# Patient Record
Sex: Male | Born: 1985 | Race: Black or African American | Hispanic: No | Marital: Single | Smoking: Former smoker
Health system: Southern US, Community
[De-identification: ages and names within clinical notes are randomized; demographics above are authoritative.]

## PROBLEM LIST (undated history)

## (undated) DIAGNOSIS — D571 Sickle-cell disease without crisis: Secondary | ICD-10-CM

## (undated) DIAGNOSIS — J189 Pneumonia, unspecified organism: Secondary | ICD-10-CM

## (undated) DIAGNOSIS — Z992 Dependence on renal dialysis: Secondary | ICD-10-CM

## (undated) DIAGNOSIS — N289 Disorder of kidney and ureter, unspecified: Secondary | ICD-10-CM

## (undated) DIAGNOSIS — I509 Heart failure, unspecified: Secondary | ICD-10-CM

## (undated) DIAGNOSIS — I639 Cerebral infarction, unspecified: Secondary | ICD-10-CM

## (undated) DIAGNOSIS — F5089 Other specified eating disorder: Secondary | ICD-10-CM

## (undated) DIAGNOSIS — F1910 Other psychoactive substance abuse, uncomplicated: Secondary | ICD-10-CM

## (undated) DIAGNOSIS — D649 Anemia, unspecified: Secondary | ICD-10-CM

## (undated) DIAGNOSIS — E213 Hyperparathyroidism, unspecified: Secondary | ICD-10-CM

## (undated) DIAGNOSIS — N186 End stage renal disease: Secondary | ICD-10-CM

## (undated) DIAGNOSIS — R0602 Shortness of breath: Secondary | ICD-10-CM

## (undated) HISTORY — PX: CHOLECYSTECTOMY: SHX55

## (undated) HISTORY — PX: WISDOM TOOTH EXTRACTION: SHX21

---
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
## 2007-02-07 ENCOUNTER — Emergency Department (HOSPITAL_COMMUNITY): Admission: EM | Admit: 2007-02-07 | Discharge: 2007-02-07 | Payer: Self-pay | Admitting: Emergency Medicine

## 2007-06-25 ENCOUNTER — Inpatient Hospital Stay (HOSPITAL_COMMUNITY): Admission: EM | Admit: 2007-06-25 | Discharge: 2007-06-26 | Payer: Self-pay | Admitting: Emergency Medicine

## 2007-08-12 ENCOUNTER — Emergency Department (HOSPITAL_COMMUNITY): Admission: EM | Admit: 2007-08-12 | Discharge: 2007-08-12 | Payer: Self-pay | Admitting: Emergency Medicine

## 2007-08-13 ENCOUNTER — Inpatient Hospital Stay (HOSPITAL_COMMUNITY): Admission: EM | Admit: 2007-08-13 | Discharge: 2007-08-16 | Payer: Self-pay | Admitting: *Deleted

## 2007-09-02 ENCOUNTER — Emergency Department (HOSPITAL_COMMUNITY): Admission: EM | Admit: 2007-09-02 | Discharge: 2007-09-03 | Payer: Self-pay | Admitting: Emergency Medicine

## 2007-09-28 ENCOUNTER — Emergency Department (HOSPITAL_COMMUNITY): Admission: EM | Admit: 2007-09-28 | Discharge: 2007-09-28 | Payer: Self-pay | Admitting: Emergency Medicine

## 2007-09-29 ENCOUNTER — Inpatient Hospital Stay (HOSPITAL_COMMUNITY): Admission: EM | Admit: 2007-09-29 | Discharge: 2007-10-05 | Payer: Self-pay | Admitting: Internal Medicine

## 2007-11-13 ENCOUNTER — Emergency Department (HOSPITAL_COMMUNITY): Admission: EM | Admit: 2007-11-13 | Discharge: 2007-11-13 | Payer: Self-pay | Admitting: Emergency Medicine

## 2007-11-24 ENCOUNTER — Inpatient Hospital Stay (HOSPITAL_COMMUNITY): Admission: EM | Admit: 2007-11-24 | Discharge: 2007-11-26 | Payer: Self-pay | Admitting: Emergency Medicine

## 2007-11-25 ENCOUNTER — Encounter (INDEPENDENT_AMBULATORY_CARE_PROVIDER_SITE_OTHER): Payer: Self-pay | Admitting: Surgery

## 2008-02-06 ENCOUNTER — Emergency Department (HOSPITAL_COMMUNITY): Admission: EM | Admit: 2008-02-06 | Discharge: 2008-02-06 | Payer: Self-pay | Admitting: Emergency Medicine

## 2008-03-15 ENCOUNTER — Emergency Department (HOSPITAL_COMMUNITY): Admission: EM | Admit: 2008-03-15 | Discharge: 2008-03-15 | Payer: Self-pay | Admitting: Emergency Medicine

## 2008-03-18 ENCOUNTER — Emergency Department (HOSPITAL_COMMUNITY): Admission: EM | Admit: 2008-03-18 | Discharge: 2008-03-18 | Payer: Self-pay | Admitting: Emergency Medicine

## 2008-04-16 ENCOUNTER — Emergency Department (HOSPITAL_COMMUNITY): Admission: EM | Admit: 2008-04-16 | Discharge: 2008-04-16 | Payer: Self-pay | Admitting: Emergency Medicine

## 2008-05-07 ENCOUNTER — Emergency Department (HOSPITAL_COMMUNITY): Admission: EM | Admit: 2008-05-07 | Discharge: 2008-05-07 | Payer: Self-pay | Admitting: Emergency Medicine

## 2008-05-21 ENCOUNTER — Inpatient Hospital Stay (HOSPITAL_COMMUNITY): Admission: EM | Admit: 2008-05-21 | Discharge: 2008-05-22 | Payer: Self-pay | Admitting: Emergency Medicine

## 2008-07-09 ENCOUNTER — Emergency Department (HOSPITAL_COMMUNITY): Admission: EM | Admit: 2008-07-09 | Discharge: 2008-07-09 | Payer: Self-pay | Admitting: Emergency Medicine

## 2008-07-09 ENCOUNTER — Emergency Department (HOSPITAL_COMMUNITY): Admission: EM | Admit: 2008-07-09 | Discharge: 2008-07-09 | Payer: Self-pay | Admitting: *Deleted

## 2008-07-11 ENCOUNTER — Emergency Department (HOSPITAL_COMMUNITY): Admission: EM | Admit: 2008-07-11 | Discharge: 2008-07-11 | Payer: Self-pay | Admitting: Emergency Medicine

## 2008-10-15 ENCOUNTER — Emergency Department (HOSPITAL_COMMUNITY): Admission: EM | Admit: 2008-10-15 | Discharge: 2008-10-15 | Payer: Self-pay

## 2008-10-29 ENCOUNTER — Emergency Department (HOSPITAL_COMMUNITY): Admission: EM | Admit: 2008-10-29 | Discharge: 2008-10-29 | Payer: Self-pay | Admitting: Emergency Medicine

## 2009-02-27 ENCOUNTER — Observation Stay (HOSPITAL_COMMUNITY): Admission: EM | Admit: 2009-02-27 | Discharge: 2009-02-27 | Payer: Self-pay | Admitting: Emergency Medicine

## 2009-03-24 ENCOUNTER — Observation Stay (HOSPITAL_COMMUNITY): Admission: EM | Admit: 2009-03-24 | Discharge: 2009-03-24 | Payer: Self-pay | Admitting: Emergency Medicine

## 2009-08-08 IMAGING — CR DG CHEST 2V
2 series · 2 of 2 positions shown · non-contrast
Comparison: 08/13/2007

CLINICAL DATA: Fever. Sickle cell crisis.

CHEST - 2 VIEW

[w chest pa]
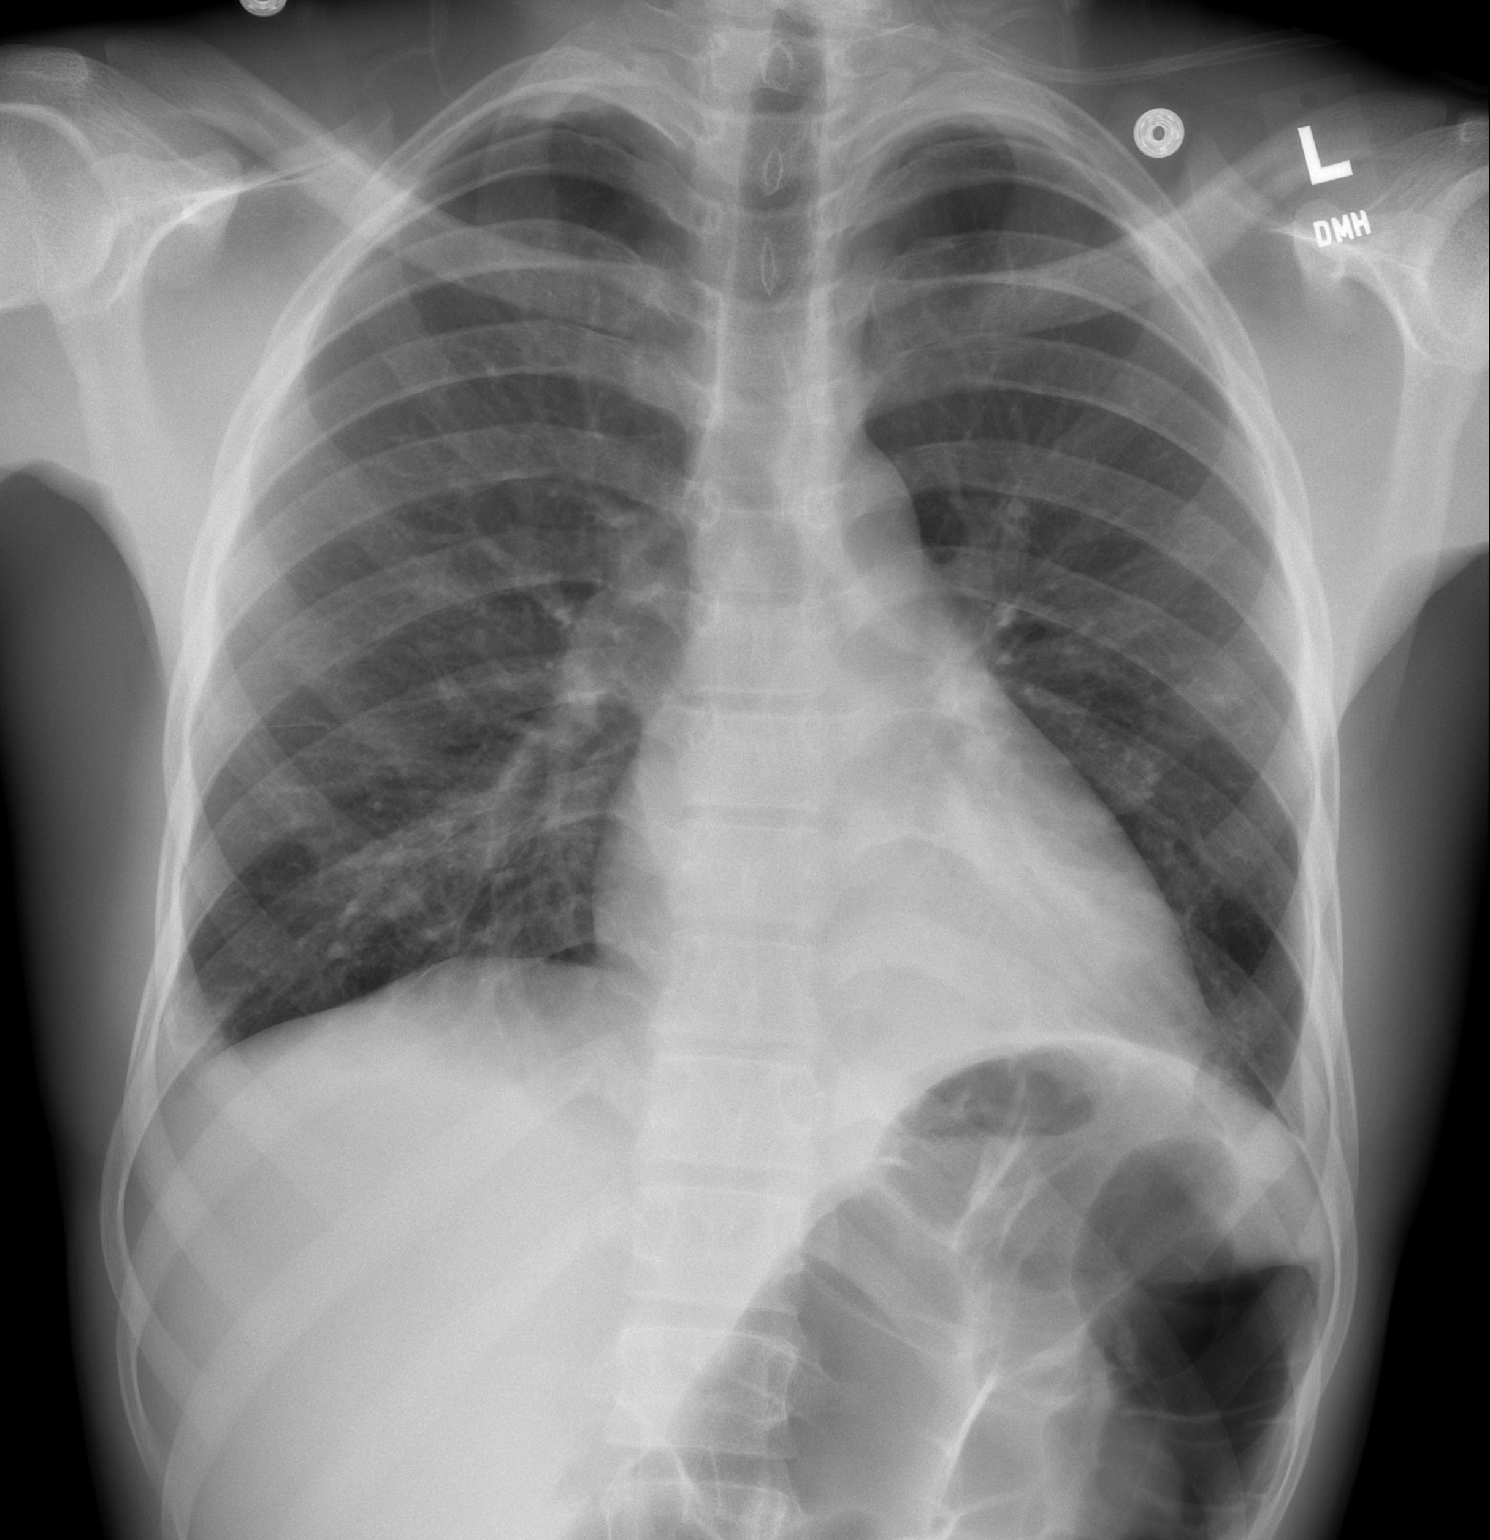

[w chest lat]
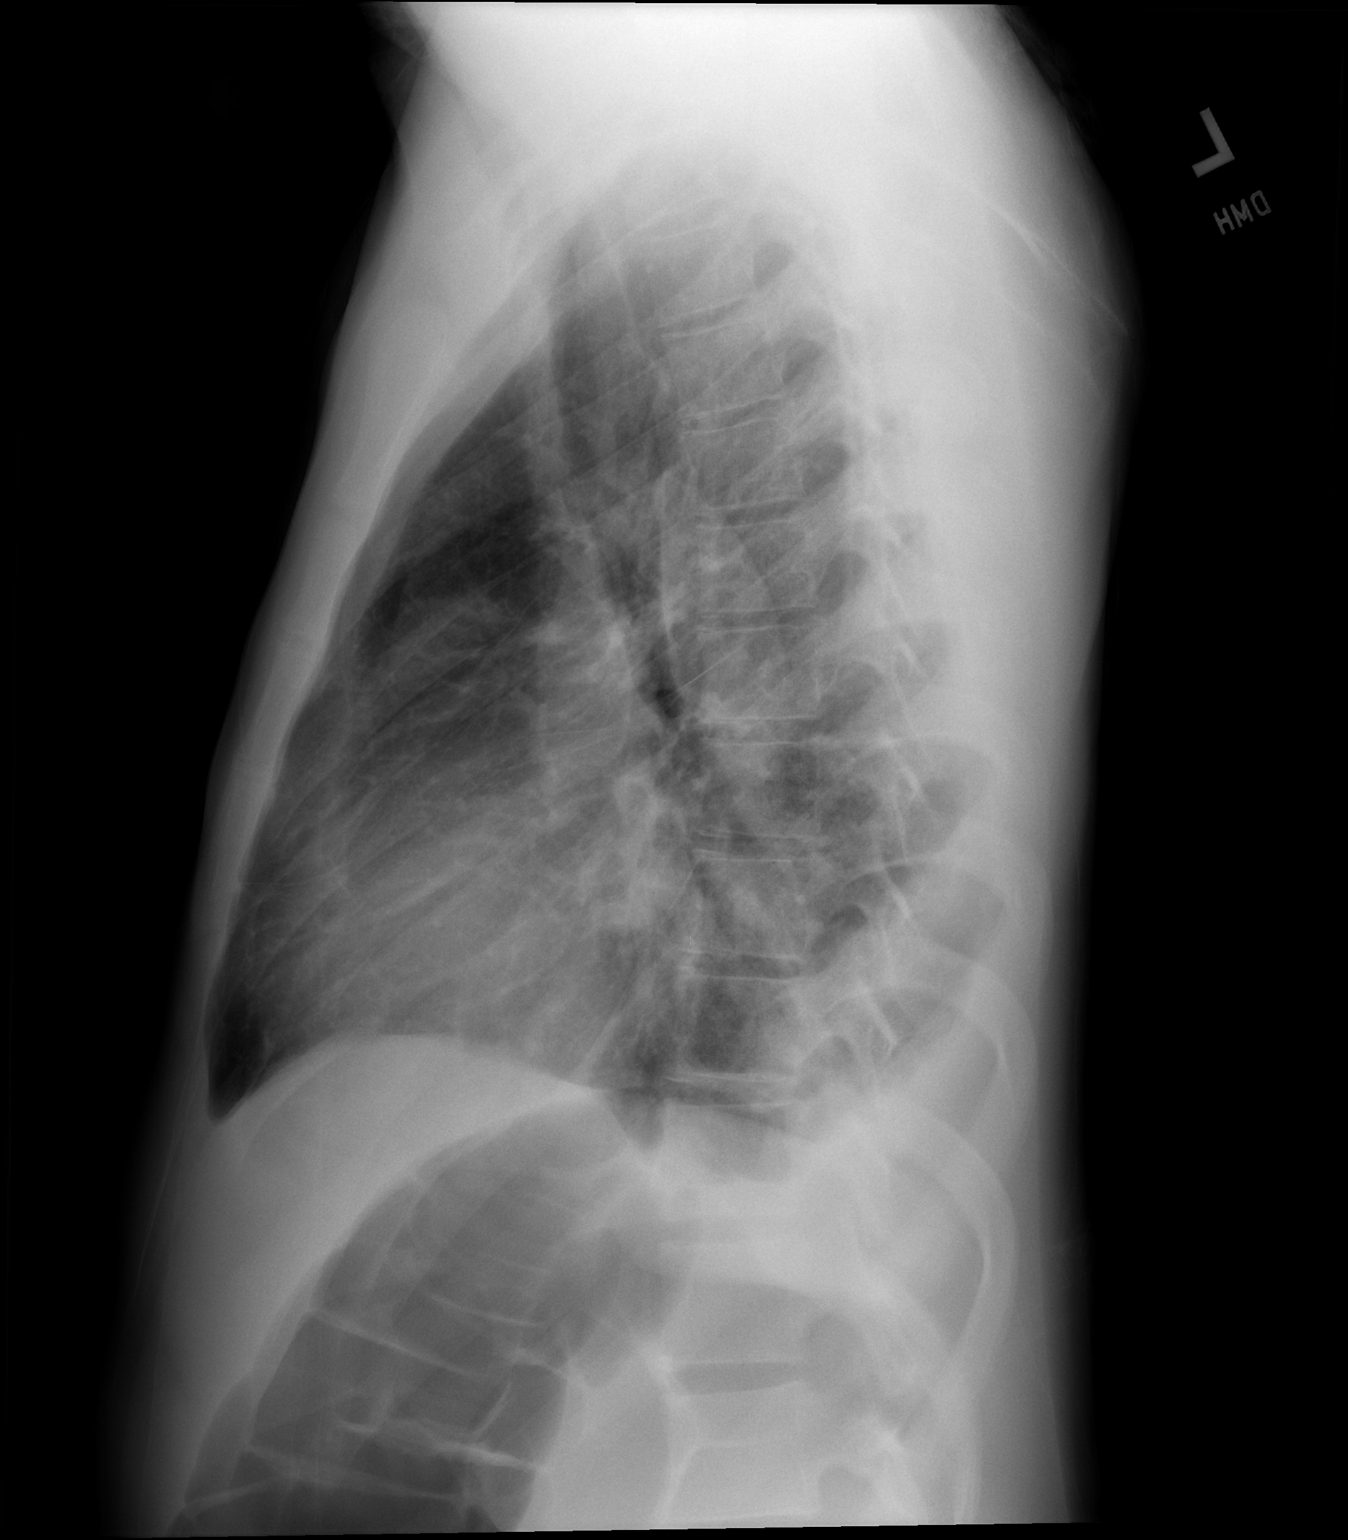

[2 of 2 positions shown; findings below may reference images not displayed]

FINDINGS: New small bilateral pleural effusions are present. There is some
retrocardiac diaphragmatic obscuration raising the possibility of left lower
lobe atelectasis or pneumonia. The remainder of lungs is clear. The cardiac
contour, although not absolutely enlarged, is more prominent than on the prior
exam.

IMPRESSION

1. New small bilateral pleural effusions. Retrocardiac airspace opacity could
represent atelectasis or pneumonia.

## 2009-09-16 ENCOUNTER — Observation Stay (HOSPITAL_COMMUNITY): Admission: EM | Admit: 2009-09-16 | Discharge: 2009-09-16 | Payer: Self-pay | Admitting: Emergency Medicine

## 2009-10-22 ENCOUNTER — Emergency Department (HOSPITAL_COMMUNITY): Admission: EM | Admit: 2009-10-22 | Discharge: 2009-10-22 | Payer: Self-pay | Admitting: Emergency Medicine

## 2009-12-10 ENCOUNTER — Observation Stay (HOSPITAL_COMMUNITY): Admission: EM | Admit: 2009-12-10 | Discharge: 2009-12-10 | Payer: Self-pay | Admitting: Emergency Medicine

## 2010-01-19 ENCOUNTER — Observation Stay (HOSPITAL_COMMUNITY): Admission: EM | Admit: 2010-01-19 | Discharge: 2010-01-19 | Payer: Self-pay | Admitting: Emergency Medicine

## 2010-04-02 ENCOUNTER — Observation Stay (HOSPITAL_COMMUNITY): Admission: EM | Admit: 2010-04-02 | Discharge: 2010-04-02 | Payer: Self-pay | Admitting: Emergency Medicine

## 2010-10-01 ENCOUNTER — Inpatient Hospital Stay (HOSPITAL_COMMUNITY)
Admission: EM | Admit: 2010-10-01 | Discharge: 2010-10-04 | Payer: Self-pay | Source: Home / Self Care | Attending: Internal Medicine | Admitting: Internal Medicine

## 2010-10-04 LAB — COMPREHENSIVE METABOLIC PANEL
ALT: 41 U/L (ref 0–53)
AST: 61 U/L — ABNORMAL HIGH (ref 0–37)
Albumin: 2.9 g/dL — ABNORMAL LOW (ref 3.5–5.2)
Alkaline Phosphatase: 58 U/L (ref 39–117)
BUN: 4 mg/dL — ABNORMAL LOW (ref 6–23)
CO2: 27 mEq/L (ref 19–32)
Calcium: 8.6 mg/dL (ref 8.4–10.5)
Chloride: 102 mEq/L (ref 96–112)
Creatinine, Ser: 0.5 mg/dL (ref 0.4–1.5)
GFR calc Af Amer: >60 mL/min (ref >60)
GFR calc non Af Amer: >60 mL/min (ref >60)
Glucose, Bld: 95 mg/dL (ref 70–99)
Potassium: 3.9 mEq/L (ref 3.5–5.1)
Sodium: 136 mEq/L (ref 135–145)
Total Bilirubin: 9.9 mg/dL — ABNORMAL HIGH (ref 0.3–1.2)
Total Protein: 6.7 g/dL (ref 6.0–8.3)

## 2010-10-04 LAB — CBC
HCT: 18.6 % — ABNORMAL LOW (ref 39.0–52.0)
Hemoglobin: 7.1 g/dL — ABNORMAL LOW (ref 13.0–17.0)
MCH: 31.6 pg (ref 26.0–34.0)
MCHC: 38.2 g/dL — ABNORMAL HIGH (ref 30.0–36.0)
MCV: 82.7 fL (ref 78.0–100.0)
Platelets: 238 10*3/uL (ref 150–400)
RBC: 2.25 MIL/uL — ABNORMAL LOW (ref 4.22–5.81)
RDW: 22.6 % — ABNORMAL HIGH (ref 11.5–15.5)
WBC: 5.4 10*3/uL (ref 4.0–10.5)

## 2010-10-04 LAB — LACTATE DEHYDROGENASE: LDH: 486 U/L — ABNORMAL HIGH (ref 94–250)

## 2010-12-11 ENCOUNTER — Emergency Department (HOSPITAL_COMMUNITY)
Admission: EM | Admit: 2010-12-11 | Discharge: 2010-12-11 | Disposition: A | Discharge status: Home / Self Care | Payer: Medicaid Other | Attending: Emergency Medicine | Admitting: Emergency Medicine

## 2010-12-11 DIAGNOSIS — Z8673 Personal history of transient ischemic attack (TIA), and cerebral infarction without residual deficits: Secondary | ICD-10-CM | POA: Insufficient documentation

## 2010-12-11 DIAGNOSIS — R52 Pain, unspecified: Secondary | ICD-10-CM | POA: Insufficient documentation

## 2010-12-11 DIAGNOSIS — D571 Sickle-cell disease without crisis: Secondary | ICD-10-CM | POA: Insufficient documentation

## 2010-12-11 DIAGNOSIS — D5700 Hb-SS disease with crisis, unspecified: Secondary | ICD-10-CM | POA: Insufficient documentation

## 2010-12-11 DIAGNOSIS — Z79899 Other long term (current) drug therapy: Secondary | ICD-10-CM | POA: Insufficient documentation

## 2010-12-11 LAB — CULTURE, BLOOD (ROUTINE X 2)
Culture  Setup Time: 201201021122
Culture  Setup Time: 201201021122
Culture: NO GROWTH
Culture: NO GROWTH

## 2010-12-11 LAB — CBC
HCT: 23.5 % — ABNORMAL LOW (ref 39.0–52.0)
HCT: 18.9 % — ABNORMAL LOW (ref 39.0–52.0)
HCT: 19 % — ABNORMAL LOW (ref 39.0–52.0)
HCT: 19.7 % — ABNORMAL LOW (ref 39.0–52.0)
HCT: 20.1 % — ABNORMAL LOW (ref 39.0–52.0)
Hemoglobin: 7.2 g/dL — ABNORMAL LOW (ref 13.0–17.0)
Hemoglobin: 7.3 g/dL — ABNORMAL LOW (ref 13.0–17.0)
Hemoglobin: 7.5 g/dL — ABNORMAL LOW (ref 13.0–17.0)
Hemoglobin: 7.6 g/dL — ABNORMAL LOW (ref 13.0–17.0)
MCH: 31.3 pg (ref 26.0–34.0)
MCH: 32.1 pg (ref 26.0–34.0)
MCH: 32.2 pg (ref 26.0–34.0)
MCHC: 36.5 g/dL — ABNORMAL HIGH (ref 30.0–36.0)
MCHC: 37 g/dL — ABNORMAL HIGH (ref 30.0–36.0)
MCHC: 38.4 g/dL — ABNORMAL HIGH (ref 30.0–36.0)
MCV: 88 fL (ref 78.0–100.0)
MCV: 83.7 fL (ref 78.0–100.0)
MCV: 84.2 fL (ref 78.0–100.0)
MCV: 84.6 fL (ref 78.0–100.0)
MCV: 84.8 fL (ref 78.0–100.0)
MCV: 85.7 fL (ref 78.0–100.0)
Platelets: 234 10*3/uL (ref 150–400)
Platelets: 279 10*3/uL (ref 150–400)
RBC: 2.67 MIL/uL — ABNORMAL LOW (ref 4.22–5.81)
RBC: 2.27 MIL/uL — ABNORMAL LOW (ref 4.22–5.81)
RBC: 2.34 MIL/uL — ABNORMAL LOW (ref 4.22–5.81)
RBC: 2.37 MIL/uL — ABNORMAL LOW (ref 4.22–5.81)
RBC: 2.41 MIL/uL — ABNORMAL LOW (ref 4.22–5.81)
RDW: 21.1 % — ABNORMAL HIGH (ref 11.5–15.5)
RDW: 22.1 % — ABNORMAL HIGH (ref 11.5–15.5)
RDW: 22.7 % — ABNORMAL HIGH (ref 11.5–15.5)
RDW: 23.5 % — ABNORMAL HIGH (ref 11.5–15.5)
RDW: 23.7 % — ABNORMAL HIGH (ref 11.5–15.5)
WBC: 9.2 10*3/uL (ref 4.0–10.5)
WBC: 6.2 10*3/uL (ref 4.0–10.5)
WBC: 8.2 10*3/uL (ref 4.0–10.5)
WBC: 8.7 10*3/uL (ref 4.0–10.5)
WBC: 8.9 10*3/uL (ref 4.0–10.5)
WBC: 9.1 10*3/uL (ref 4.0–10.5)

## 2010-12-11 LAB — TYPE AND SCREEN
ABO/RH(D): O NEG
Antibody Screen: NEGATIVE
Unit division: 0
Unit division: 0
Unit division: 0

## 2010-12-11 LAB — URINALYSIS, ROUTINE W REFLEX MICROSCOPIC
Bilirubin Urine: NEGATIVE
Glucose, UA: NEGATIVE mg/dL
Ketones, ur: NEGATIVE mg/dL
Leukocytes, UA: NEGATIVE
Nitrite: NEGATIVE
Nitrite: NEGATIVE
Protein, ur: 30 mg/dL — AB
Specific Gravity, Urine: 1.012 (ref 1.005–1.030)
Specific Gravity, Urine: 1.013 (ref 1.005–1.030)
pH: 6 (ref 5.0–8.0)

## 2010-12-11 LAB — BASIC METABOLIC PANEL
CO2: 23 mEq/L (ref 19–32)
Chloride: 104 mEq/L (ref 96–112)
Creatinine, Ser: 0.53 mg/dL (ref 0.4–1.5)
Creatinine, Ser: 0.6 mg/dL (ref 0.4–1.5)
GFR calc Af Amer: >60 mL/min (ref >60)
Potassium: 4.4 mEq/L (ref 3.5–5.1)
Potassium: 3.9 mEq/L (ref 3.5–5.1)
Sodium: 140 mEq/L (ref 135–145)

## 2010-12-11 LAB — DIFFERENTIAL
Basophils Absolute: 0.1 10*3/uL (ref 0.0–0.1)
Basophils Relative: 0 % (ref 0–1)
Basophils Relative: 1 % (ref 0–1)
Eosinophils Relative: 7 % — ABNORMAL HIGH (ref 0–5)
Eosinophils Relative: 6 % — ABNORMAL HIGH (ref 0–5)
Lymphocytes Relative: 35 % (ref 12–46)
Lymphs Abs: 2.9 10*3/uL (ref 0.7–4.0)
Monocytes Relative: 11 % (ref 3–12)
Monocytes Relative: 12 % (ref 3–12)
Neutro Abs: 3.7 10*3/uL (ref 1.7–7.7)
Neutrophils Relative %: 47 % (ref 43–77)

## 2010-12-11 LAB — HEPATITIS PANEL, ACUTE
HCV Ab: NEGATIVE
Hep A IgM: NEGATIVE
Hep B C IgM: NEGATIVE

## 2010-12-11 LAB — URINALYSIS, MICROSCOPIC ONLY
Bilirubin Urine: NEGATIVE
Glucose, UA: NEGATIVE mg/dL
Nitrite: NEGATIVE
Specific Gravity, Urine: 1.01 (ref 1.005–1.030)
Specific Gravity, Urine: 1.011 (ref 1.005–1.030)
Urobilinogen, UA: 1 mg/dL (ref 0.0–1.0)
Urobilinogen, UA: 2 mg/dL — ABNORMAL HIGH (ref 0.0–1.0)
pH: 5.5 (ref 5.0–8.0)
pH: 5.5 (ref 5.0–8.0)

## 2010-12-11 LAB — URINE CULTURE: Colony Count: NO GROWTH

## 2010-12-11 LAB — FERRITIN
Ferritin: 110 ng/mL (ref 22–322)
Ferritin: 150 ng/mL (ref 22–322)

## 2010-12-11 LAB — URINE MICROSCOPIC-ADD ON

## 2010-12-11 LAB — HEPATIC FUNCTION PANEL
ALT: 92 U/L — ABNORMAL HIGH (ref 0–53)
AST: 134 U/L — ABNORMAL HIGH (ref 0–37)
Alkaline Phosphatase: 77 U/L (ref 39–117)
Bilirubin, Direct: 1 mg/dL — ABNORMAL HIGH (ref 0.0–0.3)
Indirect Bilirubin: 12.4 mg/dL — ABNORMAL HIGH (ref 0.3–0.9)
Total Bilirubin: 13.4 mg/dL — ABNORMAL HIGH (ref 0.3–1.2)

## 2010-12-11 LAB — ETHANOL: Alcohol, Ethyl (B): 139 mg/dL — ABNORMAL HIGH (ref 0–10)

## 2010-12-11 LAB — COMPREHENSIVE METABOLIC PANEL
AST: 77 U/L — ABNORMAL HIGH (ref 0–37)
Albumin: 2.9 g/dL — ABNORMAL LOW (ref 3.5–5.2)
Alkaline Phosphatase: 55 U/L (ref 39–117)
BUN: 3 mg/dL — ABNORMAL LOW (ref 6–23)
BUN: 4 mg/dL — ABNORMAL LOW (ref 6–23)
CO2: 25 mEq/L (ref 19–32)
Calcium: 8.5 mg/dL (ref 8.4–10.5)
GFR calc Af Amer: >60 mL/min (ref >60)
GFR calc non Af Amer: >60 mL/min (ref >60)
Glucose, Bld: 92 mg/dL (ref 70–99)
Potassium: 3.6 mEq/L (ref 3.5–5.1)
Total Protein: 6.3 g/dL (ref 6.0–8.3)
Total Protein: 6.7 g/dL (ref 6.0–8.3)

## 2010-12-11 LAB — CK TOTAL AND CKMB (NOT AT ARMC): CK, MB: 0.9 ng/mL (ref 0.3–4.0)

## 2010-12-11 LAB — CARDIAC PANEL(CRET KIN+CKTOT+MB+TROPI)
CK, MB: 0.9 ng/mL (ref 0.3–4.0)
Relative Index: 0.6 (ref 0.0–2.5)
Total CK: 143 U/L (ref 7–232)
Troponin I: 0.01 ng/mL (ref 0.00–0.06)

## 2010-12-11 LAB — TRANSFUSION REACTION

## 2010-12-11 LAB — PREPARE RBC (CROSSMATCH)

## 2010-12-11 LAB — RETICULOCYTES
RBC.: 2.67 MIL/uL — ABNORMAL LOW (ref 4.22–5.81)
RBC.: 2.31 MIL/uL — ABNORMAL LOW (ref 4.22–5.81)
Retic Count, Absolute: 371.1 10*3/uL — ABNORMAL HIGH (ref 19.0–186.0)
Retic Count, Absolute: 421.9 10*3/uL — ABNORMAL HIGH (ref 19.0–186.0)
Retic Ct Pct: 26.5 % — ABNORMAL HIGH (ref 0.4–3.1)

## 2010-12-11 LAB — RAPID URINE DRUG SCREEN, HOSP PERFORMED
Amphetamines: NOT DETECTED
Cocaine: NOT DETECTED
Opiates: POSITIVE — AB
Tetrahydrocannabinol: POSITIVE — AB

## 2010-12-11 LAB — IRON AND TIBC
Saturation Ratios: 50 % (ref 20–55)
UIBC: 123 ug/dL

## 2010-12-11 LAB — VITAMIN B12: Vitamin B-12: 640 pg/mL (ref 211–911)

## 2010-12-12 LAB — URINE CULTURE
Colony Count: NO GROWTH
Culture  Setup Time: 201203121924

## 2010-12-17 LAB — BASIC METABOLIC PANEL
BUN: 5 mg/dL — ABNORMAL LOW (ref 6–23)
CO2: 23 mEq/L (ref 19–32)
Chloride: 106 mEq/L (ref 96–112)
Creatinine, Ser: 0.55 mg/dL (ref 0.4–1.5)

## 2010-12-17 LAB — DIFFERENTIAL
Basophils Absolute: 0 10*3/uL (ref 0.0–0.1)
Basophils Relative: 1 % (ref 0–1)
Eosinophils Absolute: 0.5 10*3/uL (ref 0.0–0.7)
Eosinophils Absolute: 0.6 10*3/uL (ref 0.0–0.7)
Lymphs Abs: 2.3 10*3/uL (ref 0.7–4.0)
Monocytes Absolute: 1.1 10*3/uL — ABNORMAL HIGH (ref 0.1–1.0)
Monocytes Relative: 6 % (ref 3–12)
Neutro Abs: 6.2 10*3/uL (ref 1.7–7.7)
Neutrophils Relative %: 57 % (ref 43–77)

## 2010-12-17 LAB — CBC
MCH: 35 pg — ABNORMAL HIGH (ref 26.0–34.0)
MCHC: 35.9 g/dL (ref 30.0–36.0)
MCV: 98.2 fL (ref 78.0–100.0)
MCV: 101.8 fL — ABNORMAL HIGH (ref 78.0–100.0)
Platelets: 295 10*3/uL (ref 150–400)
RBC: 2.29 MIL/uL — ABNORMAL LOW (ref 4.22–5.81)
RDW: 27.2 % — ABNORMAL HIGH (ref 11.5–15.5)

## 2010-12-17 LAB — RETICULOCYTES: RBC.: 2.32 MIL/uL — ABNORMAL LOW (ref 4.22–5.81)

## 2010-12-19 LAB — URINALYSIS, ROUTINE W REFLEX MICROSCOPIC
Glucose, UA: NEGATIVE mg/dL
Hgb urine dipstick: NEGATIVE
Specific Gravity, Urine: 1.011 (ref 1.005–1.030)
Urobilinogen, UA: 1 mg/dL (ref 0.0–1.0)

## 2010-12-19 LAB — CBC
HCT: 20.1 % — ABNORMAL LOW (ref 39.0–52.0)
Hemoglobin: 7.2 g/dL — ABNORMAL LOW (ref 13.0–17.0)
MCHC: 35.6 g/dL (ref 30.0–36.0)
RDW: 22 % — ABNORMAL HIGH (ref 11.5–15.5)

## 2010-12-19 LAB — DIFFERENTIAL
Blasts: 0 %
Lymphocytes Relative: 25 % (ref 12–46)
Lymphs Abs: 2.5 10*3/uL (ref 0.7–4.0)
Monocytes Absolute: 0.4 10*3/uL (ref 0.1–1.0)
Monocytes Relative: 4 % (ref 3–12)
Neutrophils Relative %: 62 % (ref 43–77)
nRBC: 5 /100 WBC — ABNORMAL HIGH

## 2010-12-19 LAB — COMPREHENSIVE METABOLIC PANEL
BUN: 6 mg/dL (ref 6–23)
Calcium: 8.8 mg/dL (ref 8.4–10.5)
Creatinine, Ser: 0.49 mg/dL (ref 0.4–1.5)
Glucose, Bld: 77 mg/dL (ref 70–99)
Sodium: 138 mEq/L (ref 135–145)
Total Protein: 7 g/dL (ref 6.0–8.3)

## 2010-12-20 ENCOUNTER — Inpatient Hospital Stay (HOSPITAL_COMMUNITY)
Admission: EM | Admit: 2010-12-20 | Discharge: 2010-12-21 | DRG: 193 | Disposition: A | Discharge status: Home / Self Care | Payer: Medicaid Other | Attending: Internal Medicine | Admitting: Internal Medicine

## 2010-12-20 ENCOUNTER — Emergency Department (HOSPITAL_COMMUNITY): Payer: Medicaid Other

## 2010-12-20 DIAGNOSIS — R17 Unspecified jaundice: Secondary | ICD-10-CM | POA: Diagnosis present

## 2010-12-20 DIAGNOSIS — Z79899 Other long term (current) drug therapy: Secondary | ICD-10-CM

## 2010-12-20 DIAGNOSIS — D5700 Hb-SS disease with crisis, unspecified: Secondary | ICD-10-CM | POA: Diagnosis present

## 2010-12-20 DIAGNOSIS — F1010 Alcohol abuse, uncomplicated: Secondary | ICD-10-CM | POA: Diagnosis present

## 2010-12-20 DIAGNOSIS — J189 Pneumonia, unspecified organism: Principal | ICD-10-CM | POA: Diagnosis present

## 2010-12-20 DIAGNOSIS — F1210 Cannabis abuse, uncomplicated: Secondary | ICD-10-CM | POA: Diagnosis present

## 2010-12-20 LAB — CBC
HCT: 18.7 % — ABNORMAL LOW (ref 39.0–52.0)
Hemoglobin: 6.7 g/dL — CL (ref 13.0–17.0)
MCH: 32.8 pg (ref 26.0–34.0)
MCHC: 37.5 g/dL — ABNORMAL HIGH (ref 30.0–36.0)
MCV: 85.3 fL (ref 78.0–100.0)
Platelets: 335 10*3/uL (ref 150–400)
Platelets: 337 10*3/uL (ref 150–400)
RBC: 2.04 MIL/uL — ABNORMAL LOW (ref 4.22–5.81)
WBC: 10.2 10*3/uL (ref 4.0–10.5)
WBC: 11.2 10*3/uL — ABNORMAL HIGH (ref 4.0–10.5)

## 2010-12-20 LAB — DIFFERENTIAL
Basophils Absolute: 0.1 10*3/uL (ref 0.0–0.1)
Basophils Absolute: 0 10*3/uL (ref 0.0–0.1)
Basophils Relative: 1 % (ref 0–1)
Basophils Relative: 0 % (ref 0–1)
Eosinophils Absolute: 0.5 10*3/uL (ref 0.0–0.7)
Eosinophils Absolute: 0.7 10*3/uL (ref 0.0–0.7)
Lymphocytes Relative: 27 % (ref 12–46)
Lymphocytes Relative: 27 % (ref 12–46)
Lymphs Abs: 2.8 10*3/uL (ref 0.7–4.0)
Monocytes Absolute: 1.2 10*3/uL — ABNORMAL HIGH (ref 0.1–1.0)
Monocytes Absolute: 1.1 10*3/uL — ABNORMAL HIGH (ref 0.1–1.0)
Neutro Abs: 5.6 10*3/uL (ref 1.7–7.7)
Neutrophils Relative %: 57 % (ref 43–77)

## 2010-12-20 LAB — BASIC METABOLIC PANEL
CO2: 22 mEq/L (ref 19–32)
Calcium: 9.1 mg/dL (ref 8.4–10.5)
Chloride: 108 mEq/L (ref 96–112)
GFR calc Af Amer: >60 mL/min (ref >60)
Sodium: 137 mEq/L (ref 135–145)

## 2010-12-20 LAB — COMPREHENSIVE METABOLIC PANEL
AST: 58 U/L — ABNORMAL HIGH (ref 0–37)
Albumin: 3.6 g/dL (ref 3.5–5.2)
Alkaline Phosphatase: 58 U/L (ref 39–117)
BUN: 4 mg/dL — ABNORMAL LOW (ref 6–23)
Chloride: 105 mEq/L (ref 96–112)
GFR calc Af Amer: >60 mL/min (ref >60)
Potassium: 4 mEq/L (ref 3.5–5.1)
Total Protein: 7 g/dL (ref 6.0–8.3)

## 2010-12-20 LAB — RETICULOCYTES
RBC.: 2.18 MIL/uL — ABNORMAL LOW (ref 4.22–5.81)
Retic Count, Absolute: 383.7 10*3/uL — ABNORMAL HIGH (ref 19.0–186.0)
Retic Ct Pct: 17.6 % — ABNORMAL HIGH (ref 0.4–3.1)

## 2010-12-20 LAB — HEPATIC FUNCTION PANEL
ALT: 21 U/L (ref 0–53)
Bilirubin, Direct: 0.6 mg/dL — ABNORMAL HIGH (ref 0.0–0.3)
Indirect Bilirubin: 14.2 mg/dL — ABNORMAL HIGH (ref 0.3–0.9)
Total Protein: 6.6 g/dL (ref 6.0–8.3)

## 2010-12-20 MED ORDER — IOHEXOL 300 MG/ML  SOLN
100.0000 mL | Freq: Once | INTRAMUSCULAR | Status: AC | PRN
  Administered 2010-12-20: 100 mL via INTRAVENOUS

## 2010-12-21 LAB — CROSSMATCH
ABO/RH(D): O NEG
Antibody Screen: POSITIVE
DAT, IgG: POSITIVE

## 2010-12-21 LAB — COMPREHENSIVE METABOLIC PANEL
BUN: 3 mg/dL — ABNORMAL LOW (ref 6–23)
CO2: 24 mEq/L (ref 19–32)
Calcium: 8.6 mg/dL (ref 8.4–10.5)
Chloride: 108 mEq/L (ref 96–112)
Creatinine, Ser: 0.5 mg/dL (ref 0.4–1.5)
GFR calc non Af Amer: >60 mL/min (ref >60)
Total Bilirubin: 10.2 mg/dL — ABNORMAL HIGH (ref 0.3–1.2)

## 2010-12-21 LAB — CBC
Hemoglobin: 7.8 g/dL — ABNORMAL LOW (ref 13.0–17.0)
RBC: 2.46 MIL/uL — ABNORMAL LOW (ref 4.22–5.81)

## 2010-12-21 NOTE — H&P (Signed)
NAME:  Philip Rios, MAIORINO NO.:  [id number]  MEDICAL RECORD NO.:  [id number]           PATIENT TYPE:  E  LOCATION:  [facility]                         FACILITY:  [facility]  PHYSICIAN:  Andreas Blower, MD       DATE OF BIRTH:  [birth year]  DATE OF ADMISSION:  12/20/2010 DATE OF DISCHARGE:                             HISTORY & PHYSICAL   PRIMARY CARE PHYSICIAN:  Fleet Contras, MD  CHIEF COMPLAINT:  Left-sided chest pain, worse with inspiration.  HISTORY OF PRESENT ILLNESS:  Philip Rios is a 25 year old African American gentleman with history of sickle cell anemia crisis, alcohol use, marijuana use who presents with left-sided pleuritic chest pain.  The patient reports that in the last 3-4 days, he has been having left-sided chest pain that is worse with inspiration and worse when lying down flat.  He reports that at 10 p.m. last night, he was having some worsening of his left-sided chest pain as a result, around 2 a.m. this morning, he presented to the ER for further evaluation.  He denies any recent fevers or chills.  Denies any nausea or vomiting.  Denies any abdominal pain.  He does complain about left-sided chest pain worse with inspiration and does have shortness of breath with inspiration.  Denies any diarrhea.  Denies any headaches or vision changes. Denies any cough.  REVIEW OF SYSTEMS:  All  systems were reviewed with the patient, was positive as per HPI, otherwise all other systems are negative.  PAST MEDICAL HISTORY: 1. History of sickle cell disease. 2. History of sickle cell crisis. 3. History of sickle cell anemia. 4. History of marijuana use. 5. History of cholelithiasis, status post cholecystectomy in February     2009. 6. History of alcohol use.  SOCIAL HISTORY:  The patient smokes 1-2 blunt of marijuana per day, does not report smoking any cigarettes at this time, occasionally drinks alcohol about once every 8-9 days, reports that his most recent use  of alcohol was on December 16, 2010.  FAMILY HISTORY:  Significant for grandfather having diabetes.  He is estranged from his parents.  HOME MEDICATIONS: 1. Vicodin 5/325 one to two tablets every 6-8 hours as needed. 2. Folic acid over-the-counter 1 tablet p.o. daily. 3. Hydroxyurea 500 mg 2-3 capsules p.o. daily alternates between 2 and     3 tablets daily.  PHYSICAL EXAMINATION:  VITAL SIGNS:  Temperature is 98.0, blood pressure is 100/55, heart rate 66, respirations 18, satting at 99% on 3 liters of oxygen. GENERAL:  The patient was awake, oriented, not appear to be in acute distress, was lying in bed comfortably. HEENT:  The patient has scleral icterus.  Extraocular motions are intact.  Pupils are equal and round.  Had moist mucous membranes. NECK:  Supple. HEART:  Regular with S1 and S2. LUNGS:  Clear to auscultation bilaterally. ABDOMEN:  Soft, nontender, nondistended.  Positive bowel sounds. EXTREMITIES:  The patient has good peripheral pulses with trace edema. NEUROLOGIC:  Cranial nerves II through XII grossly intact.  He had 5/5 motor strength in the upper as well as lower extremities.  LABORATORY DATA:  CBC shows a white count of 11.2, hemoglobin is not available at this time, hematocrit 18.8.  Electrolytes normal with a creatinine of 0.49.  Total bilirubin is 14.8, direct is 0.6, indirect is 14.2, alk phos is 62, AST is 45, ALT is 21, total protein is 6.6, albumin is 3.7, calcium is 9.1.  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  There is no evidence of large central pulmonary embolism and while no embolic disease can be detective assessment of the segmental and subsegmental pulmonary arteries may not be reliable secondary to poor vascular opacification.  Posterior left lower lobe  collapse/consolidation.  ASSESSMENT AND PLAN: 1. Left-sided pleuritic chest pain, most likely secondary to pneumonia     as noted on CT.  We will start the patient on ceftriaxone and     azithromycin.  Further titration of antibiotics depending on the     clinical course. No PE noted on CT. 2. Sickle cell pain/crisis, most likely from the pneumonia.  We will     aggressively hydrate him with fluids.  We will give him oxygen,     antibiotics for the pneumonia.  We will also give him 1 unit of     blood since even though the hemoglobin is not available, his     hematocrit is low. 3. Alcohol use.  We will start the patient on thiamine, continue folic     acid, encouraged alcohol cessation. 4. Marijuana use.  Encouraged cessation of marijuana use.  I counseled     the patient with risks and benefits of marijuana cessation. 5. 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  His total bilirubin and indirect bilirubin are     elevated most likely secondary to hemolysis from sickle cell     crisis. 6. Prophylaxis.  Lovenox for deep venous thrombosis prophylaxis. 7. Code status.  The patient is a full code.  Time spent on admission talking to the patient, coordinating care was 45 minutes.   Andreas Blower, MD   SR/MEDQ  D:  12/20/2010  T:  12/20/2010  Job:  [id number]  cc:   Fleet Contras, M.D.  Electronically Signed by Wardell Heath Roshun Klingensmith  on 12/21/2010 03:32:22 PM

## 2010-12-22 NOTE — Discharge Summary (Signed)
NAME:  Philip Rios, Philip Rios NO.:  [id number]  MEDICAL RECORD NO.:  [id number]           PATIENT TYPE:  I  LOCATION:  5509                         FACILITY:  [facility]  PHYSICIAN:  Andreas Blower, MD       DATE OF BIRTH:  12/09/1985  DATE OF ADMISSION:  12/20/2010 DATE OF DISCHARGE:  12/21/2010                              DISCHARGE SUMMARY   PRIMARY CARE PHYSICIAN:  Fleet Contras, MD  DISCHARGE DIAGNOSES: 1. Left-sided pleuritic chest pain most likely due to community-     acquired pneumonia. 2. Community-acquired pneumonia. 3. Sickle cell pain/crisis from community-acquired pneumonia,     improved. 4. Sickle cell anemia. 5. Alcohol use. 6. Marijuana use. 7. Indirect hyperbilirubinemia secondary to hemolysis, stable.  DISCHARGE MEDICATIONS: 1. Azithromycin 250 mg p.o. daily for four more days to complete a 5-     day course. 2. Cefuroxime 5 mg p.o. twice daily for nine more days to complete a     10-day course. 3. Folic acid over-the-counter 1 tablet p.o. daily. 4. Hydroxyurea 500 mg 2-3 tablets daily alternate between 2 and 3     daily. 5. Vicodin 5/325 mg 1-2 tablets p.o. every 6-8 hours as needed for     pain.  BRIEF ADMITTING HISTORY AND PHYSICAL:  Philip Rios is a 25 year old African American male with history of sickle cell anemia, alcohol use, marijuana use, who presented with left-sided pleuritic chest pain and imaging was found to have left-sided pneumonia.  RADIOLOGY/IMAGING: 1. The patient had chest x-ray two-view which was negative of chest. 2. 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  There is     no evidence of large central pulmonary embolus and while no embolic     disease can be detected.  Assessment of subsegmental pulmonary     arteries and not be reliable secondary to poor vascular  opacification.  Posterior left lower lobe collapse/consolidation     noted.  LABORATORY DATA:  CBC shows white count of 8.0, hemoglobin 7.8, hematocrit 21.0, and platelet count 362.  Initial hemoglobin on presentation was 6.7.  Electrolytes were normal with a creatinine of 0.50, T-bili is 10.2, alk phos is 58, AST is 48, ALT is 23, total protein is 6.9, albumin is 3.5, and calcium is 8.6.  HOSPITAL COURSE BY PROBLEM: 1. Left-sided pleuritic change mostly secondary to pneumonia.  CT of     the chest was negative for pulmonary embolism even though was not     an optimal study. 2. Community-acquired pneumonia.  The patient initially was started on     ceftriaxone and azithromycin.  During the course of hospital stay,     his antibiotics were transitioned to cefuroxime and azithromycin.     He will continue azithromycin for 4 more days to complete a 5-day     course and the patient will continue cefuroxime for 9 more days to     complete a 10-day course. 3.  Sickle cell pain/crisis most likely from community-acquired     pneumonia.  The patient was initially aggressively hydrated and was     given O2 and antibiotics for the pneumonia.  Given that his     hemoglobin was low on presentation he was given 1 unit of PRBC.     His left-sided pleuritic chest pain and his sickle cell pain     improved.  The patient was continued on hydroxyurea. 4. Sickle cell anemia.  The patient was difficult to manage blood but     the patient was given 1 unit of bloody without any problems. 5. Alcohol use.  The patient was encouraged alcohol cessation. 6. Marijuana use.  Again I encouraged cessation of the marijuana use.     Risks and benefits were discussed. 7. Indirect hyperbilirubinemia secondary to hemolysis from sickle cell     anemia, was stable.  The patient was instructed to follow with his     primary care physician for further management.  DISPOSITION AND FOLLOWUP:  The patient is to follow up with  his primary care physician, Dr. Concepcion Elk in 1 week.  The patient was also instructed to discuss with Dr. Concepcion Elk to have the patient be referred a hematologist for long-term management of sickle cell disease and anemia.  Time spent on discharge talking to the patient and calls from the patient's primary care was 25 minutes.   Andreas Blower, MD   SR/MEDQ  D:  12/21/2010  T:  12/22/2010  Job:  [id number]  cc:   Fleet Contras, M.D.  Electronically Signed by Wardell Heath Pressley Barsky  on 12/22/2010 08:49:06 PM

## 2010-12-24 LAB — DIFFERENTIAL
Basophils Relative: 1 % (ref 0–1)
Eosinophils Relative: 6 % — ABNORMAL HIGH (ref 0–5)
Lymphs Abs: 3 10*3/uL (ref 0.7–4.0)
Monocytes Relative: 8 % (ref 3–12)
Neutro Abs: 8 10*3/uL — ABNORMAL HIGH (ref 1.7–7.7)

## 2010-12-24 LAB — CBC
HCT: 20.3 % — ABNORMAL LOW (ref 39.0–52.0)
Hemoglobin: 7.3 g/dL — ABNORMAL LOW (ref 13.0–17.0)
MCV: 101.8 fL — ABNORMAL HIGH (ref 78.0–100.0)
RBC: 1.99 MIL/uL — ABNORMAL LOW (ref 4.22–5.81)
WBC: 12.9 10*3/uL — ABNORMAL HIGH (ref 4.0–10.5)

## 2010-12-24 LAB — RETICULOCYTES: Retic Ct Pct: 12.5 % — ABNORMAL HIGH (ref 0.4–3.1)

## 2011-01-01 LAB — DIFFERENTIAL
Band Neutrophils: 0 % (ref 0–10)
Blasts: 0 %
Lymphocytes Relative: 24 % (ref 12–46)
Metamyelocytes Relative: 0 %
Myelocytes: 0 %
Promyelocytes Absolute: 0 %

## 2011-01-01 LAB — COMPREHENSIVE METABOLIC PANEL
ALT: 42 U/L (ref 0–53)
AST: 65 U/L — ABNORMAL HIGH (ref 0–37)
CO2: 21 mEq/L (ref 19–32)
Chloride: 104 mEq/L (ref 96–112)
Creatinine, Ser: 0.53 mg/dL (ref 0.4–1.5)
GFR calc Af Amer: >60 mL/min (ref >60)
GFR calc non Af Amer: >60 mL/min (ref >60)
Total Bilirubin: 9.3 mg/dL — ABNORMAL HIGH (ref 0.3–1.2)

## 2011-01-01 LAB — CBC
MCV: 99.3 fL (ref 78.0–100.0)
RBC: 2.14 MIL/uL — ABNORMAL LOW (ref 4.22–5.81)
WBC: 11 10*3/uL — ABNORMAL HIGH (ref 4.0–10.5)

## 2011-01-01 LAB — RETICULOCYTES
RBC.: 1.96 MIL/uL — ABNORMAL LOW (ref 4.22–5.81)
Retic Count, Absolute: 215.6 10*3/uL — ABNORMAL HIGH (ref 19.0–186.0)
Retic Ct Pct: 11 % — ABNORMAL HIGH (ref 0.4–3.1)

## 2011-01-08 LAB — COMPREHENSIVE METABOLIC PANEL
ALT: 23 U/L (ref 0–53)
AST: 49 U/L — ABNORMAL HIGH (ref 0–37)
Albumin: 4.3 g/dL (ref 3.5–5.2)
CO2: 25 mEq/L (ref 19–32)
Chloride: 107 mEq/L (ref 96–112)
GFR calc Af Amer: >60 mL/min (ref >60)
GFR calc non Af Amer: >60 mL/min (ref >60)
Sodium: 139 mEq/L (ref 135–145)
Total Bilirubin: 10.5 mg/dL — ABNORMAL HIGH (ref 0.3–1.2)

## 2011-01-08 LAB — DIFFERENTIAL
Basophils Absolute: 0 10*3/uL (ref 0.0–0.1)
Basophils Relative: 0 % (ref 0–1)
Lymphs Abs: 2.8 10*3/uL (ref 0.7–4.0)
Monocytes Relative: 7 % (ref 3–12)
Neutro Abs: 5.8 10*3/uL (ref 1.7–7.7)

## 2011-01-08 LAB — URINALYSIS, ROUTINE W REFLEX MICROSCOPIC
Glucose, UA: NEGATIVE mg/dL
Hgb urine dipstick: NEGATIVE
Ketones, ur: NEGATIVE mg/dL
Protein, ur: NEGATIVE mg/dL
Urobilinogen, UA: 4 mg/dL — ABNORMAL HIGH (ref 0.0–1.0)

## 2011-01-08 LAB — CBC
RBC: 2.81 MIL/uL — ABNORMAL LOW (ref 4.22–5.81)
WBC: 9.9 10*3/uL (ref 4.0–10.5)

## 2011-01-08 LAB — RETICULOCYTES: RBC.: 2.73 MIL/uL — ABNORMAL LOW (ref 4.22–5.81)

## 2011-01-09 LAB — CROSSMATCH: Antibody Screen: NEGATIVE

## 2011-01-09 LAB — CBC
Hemoglobin: 7.9 g/dL — CL (ref 13.0–17.0)
MCHC: 36.6 g/dL — ABNORMAL HIGH (ref 30.0–36.0)
MCV: 100.3 fL — ABNORMAL HIGH (ref 78.0–100.0)
RBC: 2.16 MIL/uL — ABNORMAL LOW (ref 4.22–5.81)

## 2011-01-09 LAB — POCT I-STAT, CHEM 8
BUN: 6 mg/dL (ref 6–23)
Calcium, Ion: 1.12 mmol/L (ref 1.12–1.32)
Chloride: 103 meq/L (ref 96–112)
Creatinine, Ser: 0.7 mg/dL (ref 0.4–1.5)
Glucose, Bld: 87 mg/dL (ref 70–99)
HCT: 24 % — ABNORMAL LOW (ref 39.0–52.0)
Hemoglobin: 8.2 g/dL — ABNORMAL LOW (ref 13.0–17.0)
Potassium: 4.2 meq/L (ref 3.5–5.1)
Sodium: 141 mEq/L (ref 135–145)
TCO2: 24 mmol/L (ref 0–100)

## 2011-01-09 LAB — DIFFERENTIAL
Basophils Relative: 1 % (ref 0–1)
Eosinophils Absolute: 0.2 10*3/uL (ref 0.0–0.7)
Eosinophils Relative: 2 % (ref 0–5)
Monocytes Absolute: 0.8 10*3/uL (ref 0.1–1.0)
Monocytes Relative: 8 % (ref 3–12)

## 2011-01-15 LAB — RETICULOCYTES
Retic Count, Absolute: 193.8 10*3/uL — ABNORMAL HIGH (ref 19.0–186.0)
Retic Ct Pct: 7.4 % — ABNORMAL HIGH (ref 0.4–3.1)
Retic Ct Pct: 7.1 % — ABNORMAL HIGH (ref 0.4–3.1)

## 2011-01-15 LAB — DIFFERENTIAL
Basophils Absolute: 0 10*3/uL (ref 0.0–0.1)
Eosinophils Absolute: 0.5 10*3/uL (ref 0.0–0.7)
Eosinophils Relative: 2 % (ref 0–5)
Lymphocytes Relative: 14 % (ref 12–46)
Lymphs Abs: 2.5 10*3/uL (ref 0.7–4.0)
Lymphs Abs: 1.2 10*3/uL (ref 0.7–4.0)
Monocytes Absolute: 0.9 10*3/uL (ref 0.1–1.0)
Neutrophils Relative %: 57 % (ref 43–77)

## 2011-01-15 LAB — COMPREHENSIVE METABOLIC PANEL
AST: 65 U/L — ABNORMAL HIGH (ref 0–37)
CO2: 25 mEq/L (ref 19–32)
Calcium: 9.2 mg/dL (ref 8.4–10.5)
Creatinine, Ser: 0.57 mg/dL (ref 0.4–1.5)
GFR calc Af Amer: >60 mL/min (ref >60)
GFR calc non Af Amer: >60 mL/min (ref >60)
Total Protein: 8.4 g/dL — ABNORMAL HIGH (ref 6.0–8.3)

## 2011-01-15 LAB — BASIC METABOLIC PANEL
BUN: 4 mg/dL — ABNORMAL LOW (ref 6–23)
CO2: 24 mEq/L (ref 19–32)
Calcium: 9.3 mg/dL (ref 8.4–10.5)
Chloride: 107 mEq/L (ref 96–112)
Creatinine, Ser: 0.6 mg/dL (ref 0.4–1.5)

## 2011-01-15 LAB — CBC
MCHC: 35.5 g/dL (ref 30.0–36.0)
MCHC: 36.1 g/dL — ABNORMAL HIGH (ref 30.0–36.0)
MCV: 103.2 fL — ABNORMAL HIGH (ref 78.0–100.0)
MCV: 99.9 fL (ref 78.0–100.0)
Platelets: 435 10*3/uL — ABNORMAL HIGH (ref 150–400)
Platelets: 369 10*3/uL (ref 150–400)
RBC: 2.76 MIL/uL — ABNORMAL LOW (ref 4.22–5.81)
RDW: 26.7 % — ABNORMAL HIGH (ref 11.5–15.5)

## 2011-01-28 ENCOUNTER — Emergency Department (HOSPITAL_COMMUNITY)
Admission: EM | Admit: 2011-01-28 | Discharge: 2011-01-28 | Disposition: A | Discharge status: Home / Self Care | Payer: Medicaid Other | Attending: Emergency Medicine | Admitting: Emergency Medicine

## 2011-01-28 DIAGNOSIS — D5700 Hb-SS disease with crisis, unspecified: Secondary | ICD-10-CM | POA: Insufficient documentation

## 2011-01-28 DIAGNOSIS — Z8679 Personal history of other diseases of the circulatory system: Secondary | ICD-10-CM | POA: Insufficient documentation

## 2011-01-28 DIAGNOSIS — M25519 Pain in unspecified shoulder: Secondary | ICD-10-CM | POA: Insufficient documentation

## 2011-01-28 DIAGNOSIS — M549 Dorsalgia, unspecified: Secondary | ICD-10-CM | POA: Insufficient documentation

## 2011-01-28 LAB — CBC
MCHC: 37.4 g/dL — ABNORMAL HIGH (ref 30.0–36.0)
RDW: 24.2 % — ABNORMAL HIGH (ref 11.5–15.5)

## 2011-01-28 LAB — RETICULOCYTES
RBC.: 2.43 MIL/uL — ABNORMAL LOW (ref 4.22–5.81)
Retic Ct Pct: 22.2 % — ABNORMAL HIGH (ref 0.4–3.1)

## 2011-01-28 LAB — DIFFERENTIAL
Basophils Absolute: 0.1 10*3/uL (ref 0.0–0.1)
Eosinophils Absolute: 0.5 10*3/uL (ref 0.0–0.7)
Monocytes Absolute: 0.8 10*3/uL (ref 0.1–1.0)
Neutrophils Relative %: 55 % (ref 43–77)

## 2011-01-28 LAB — BASIC METABOLIC PANEL
Calcium: 8.8 mg/dL (ref 8.4–10.5)
Chloride: 108 mEq/L (ref 96–112)
Creatinine, Ser: 0.42 mg/dL (ref 0.4–1.5)
GFR calc Af Amer: >60 mL/min (ref >60)

## 2011-02-13 NOTE — Discharge Summary (Signed)
NAME:  Philip Rios, Philip Rios NO.:  [id number]   MEDICAL RECORD NO.:  [id number]          PATIENT TYPE:  INP   LOCATION:  6733                         FACILITY:  [facility]   PHYSICIAN:  Fleet Contras, M.D.    DATE OF BIRTH:  [birth year]   DATE OF ADMISSION:  08/13/2007  DATE OF DISCHARGE:  08/16/2007                               DISCHARGE SUMMARY   HISTORY OF PRESENT ILLNESS:  Philip Rios is a 25 year old African American  gentleman with past medical history significant for sickle cell disease  with SS asthma.  He came to the emergency room at [facility] with 1-  day history of chest pain and low back pain.  He had been seen in the  emergency room and discharged after Dilaudid injection, but his symptoms  recurred and he came back.  He had no history of any injuries or trauma.  He had no cough, sputum production, shortness of breath, fevers or  chills.  He had no abdominal pain, nausea, vomiting, or diarrhea.  No  headaches, dizziness or blurring of vision.  In the emergency room, he  received several doses of intravenous Dilaudid, but his symptoms did not  abate enough to be discharged.  He was therefore admitted for pain  management.   HOSPITAL COURSE:  On admission, vital signs were stable with a blood  pressure of 95/45, heart rate of 70, respiratory rate of 20, temperature  98.6, and O2 saturations on 2 liters was 99%.  Initial laboratory data  showed a white count of 10.2, hemoglobin 9.2, hematocrit 494,  reticulocyte count of 6.2%.  Sodium was 136, potassium 4.1, chloride  106, bicarbonate of 24, BUN was 5, creatinine 0.6, and glucose of 96.  Urinalysis was negative, and chest x-ray showed no active  cardiopulmonary process.  He was therefore admitted and put on  intravenous fluids as well as intravenous Dilaudid for pain management.  Incentive spirometry and oxygen via [place] were given.  His condition began  to improve within 24 hours.  On August 15, 2007, he started  to have  some high fevers up to 102.5.  Chest x-ray was performed, and this  showed airspace opacity and bilateral pleural effusion.  This was  thought to be due to pneumonia.  He was started on intravenous Rocephin.  Blood cultures, urine cultures were negative up to this time.  By the  next day, his fever had subsided and he was feeling much better.  He was  transitioned to oral antibiotics and planned for discharge home.  His  labs at the time of discharge revealed a white count of 12.2, hemoglobin  8, hematocrit 23.3, and platelet count of 333.  He was therefore  considered stable for discharge home.   DISCHARGE DIAGNOSES:  1. Pneumonia.  2. Sickle cell painful crisis.  3. Sickle cell anemia.   CONDITION ON DISCHARGE:  Stable.   DISPOSITION:  Home.   DISCHARGE MEDICATIONS:  1. Avelox 400 mg daily for 7 days.  2. Ibuprofen 800 mg t.i.d. p.r.n.  3. Hydroxyurea 1000 mg to alternate with 1500 mg daily.  4. Dilaudid 4 mg q.6h. p.r.n. for pain.      Fleet Contras, M.D.  Electronically Signed     EA/MEDQ  D:  09/28/2007  T:  09/29/2007  Job:  [id number]

## 2011-02-13 NOTE — Discharge Summary (Signed)
NAME:  Philip Rios, DORTON NO.:  [id number]   MEDICAL RECORD NO.:  [id number]          PATIENT TYPE:  INP   LOCATION:  1301                         FACILITY:  [facility]   PHYSICIAN:  Margaretmary Bayley, M.D.    DATE OF BIRTH:  09/02/1986   DATE OF ADMISSION:  09/29/2007  DATE OF DISCHARGE:  10/05/2007                               DISCHARGE SUMMARY   DISCHARGE DIAGNOSES:  1. Sickle cell crisis.  2. Leukocytosis secondary to sickle cell crisis and microinfarctions.  3. Transient hyperglycemia.  4. Renal insufficiency.   REASON FOR ADMISSION:  This is one of several [place]  hospitalizations for Ms. Huhn, a 25 year old gentleman with a history  of sickle cell disease admitted with generalized pain graded 7/10. The  patient states that his home analgesics failed to provide any  significant relief and came and he came into emergency room where he was  evaluated and subsequently admitted for pain management and rehydration.  The patient's pertinent lab and x-ray data included an admission  hematocrit of 25 with a hemoglobin of 9, white count of 14,800. A repeat  hematocrit 36 hours later had dropped to 17.8 with a hemoglobin of 6.4  and a white count at 10,900. Sed rate of 100. He had schistocytes on his  smear along with sickle cells, target cells and tear drop cells. His  CMET was normal with the exception of a BUN that was low at 2 and a  repeat was 4. His estimated GFR was greater than 60.  Elevations in his  total bilirubin 2.6.   HOSPITAL COURSE:  The patient was admitted and started on supplemental  O2 along with IV rehydration. He was transfused 2 units of red cells in  view of drop in his hematocrit to 17 and his hemoglobin of 6.4. His O2  saturation on room air was 98% and oxygen was subsequently discontinued.  With rehydration, his admission creatinine of 2.07 dropped to 1.7   Although the patient had no history of diabetes mellitus, he did have an  elevation  of his blood sugar to 330.  Apparently while receiving IV  dextrose it was not clear whether or not the blood was drawn from the  same arm receiving the IV dextrose but a repeat was noted to be 114 the  following morning. With transfusion and subsequent oxygen  supplementation and rehydration, the patient's pain gradually resolved  over a 4 day period of time. At the time of discharge, he was  significantly improved. The patient was instructed to resume his home  meds which included hydroxyurea 1000 mg every other day and 1500 every  other day on odd days.  He is to continue to use Percocet as needed for  pain and he is asked to contact Dr. Concepcion Elk for a follow-up within the  next 1-2 weeks. He is discharged home on a regular diet.   PROGNOSIS:  Fair.      Margaretmary Bayley, M.D.  Electronically Signed    PC/MEDQ  D:  11/06/2007  T:  11/07/2007  Job:  [id number]

## 2011-02-13 NOTE — Op Note (Signed)
NAME:  TIM, CORRIHER NO.:  [id number]   MEDICAL RECORD NO.:  [id number]            PATIENT TYPE:   LOCATION:                                 FACILITY:   PHYSICIAN:  Sandria Bales. Ezzard Standing, M.D.       DATE OF BIRTH:   DATE OF PROCEDURE:  DATE OF DISCHARGE:                               OPERATIVE REPORT   Why can't the operative date be placed here?   PREOPERATIVE DIAGNOSES:  Chronic cholecystitis, cholelithiasis.   POSTOPERATIVE DIAGNOSIS:  Chronic cholecystitis, cholelithiasis and  common bile duct debris.   PROCEDURE:  Laparoscopic cholecystectomy with  intra operative  cholangiogram and common duct bile duct exploration with #4 Fogarty  catheter.   SURGEON:  Dr. Ovidio Kin.   FIRST ASSISTANT:  Dr. Tim Lair.   ANESTHESIA:  General endotracheal.   ESTIMATED BLOOD LOSS:  Minimal.   INDICATIONS FOR PROCEDURE:  Mr. Coury is a 25 year old black male, a  patient of Dr. Concepcion Elk, who has sickle cell anemia and has presented  with cholelithiasis.   Discussion and carried out with the patient and his grandmother about  the indications and potential complications of gallbladder surgery.  Potential complications include but are not limited to bleeding,  infection, bowel duct injury, and the possibility of open surgery.   OPERATIVE PROCEDURE:  The patient was placed in the supine position and  given a general endotracheal anesthetic. He was already on Unasyn as an  antibiotic.  The room was kept warm because of his Sickle Cell Anemia. A  time-out was held to identify the patient and procedure.   A Hasson trocar was placed in the infraumbilical incision and secured  with a 0 Vicryl suture. A 10 mm 0 degree laparoscope was inserted  through the Hasson trocar into the abdomen. The right and left lobes of  the liver was unremarkable. The stomach was unremarkable. The bowel  for  what I could see was unremarkable.   I then turned by attention to the gallbladder.  The patient had a very  floppy liver. I was able to grab the gallbladder but it was somewhat  tense and rotated cephalad. He had evidence of chronic scarring around  the gallbladder but no acute inflammation that I could tell. Dissection  was carried out along the gallbladder cystic duct junction.   The intraoperative cholangiogram shot the first time showed no emptying  of the common bile duct. I ended up injecting about 20 cc of half  strength Renografin without evidence of emptying. I then gave him 1 mg  of Glucagon. I shot a third film without any emptying. I then explored  the common bile duct using a #4 Fogarty catheter which I passed down the  cystic duct into the common bile duct into the duodenum. I did get some  specks of bilirubinated appearing stones out of the common bile duct,  maybe 3 or 4, but these were all very small, so I would assume would  pass with time. There was no clear filling defect within the common bile  duct and the contrast  did empty on the last study after I had done the  biliary dilatation.   My assumption is that whatever he debris he has will pass. He does not  need an ERCP now but, if he has continued symptoms or worsening  hyperbilirubinemia, consideration for ERCP would certainly be the next  step.   I then triply Endo clipped the cystic duct and divided it from the  gallbladder. I double Endo clipped the cystic artery and divided it. I  then sharply and bluntly dissected the gallbladder from the gallbladder  bed using a primary [place] electrocautery for the gallbladder bed.  Prior to completing the division of the gallbladder bed, I revisualized  the [place]. I revisualized the cystic duct and there was no  bleeding, no bile leak, the gallbladder bed was dry. The gallbladder was  placed into an EndoCatch bag and looped into the umbilicus.   The umbilical port was closed with a 0 Vicryl suture. The skin in each  port was closed with  a 5-0 Vicryl suture, painted with Tincture of  Benzoin, and covered with a Steri-Strip. The patient tolerated the  procedure well and was transported to the recovery room in good  condition.  Sponge and needle count were correct at the end of the case.      Sandria Bales. Ezzard Standing, M.D.  Electronically Signed     DHN/MEDQ  D:  11/25/2007  T:  11/25/2007  Job:  [id number]   cc:   Fleet Contras, M.D.

## 2011-02-13 NOTE — H&P (Signed)
NAME:  Philip Rios, Philip Rios NO.:  [id number]   MEDICAL RECORD NO.:  [id number]          PATIENT TYPE:  INP   LOCATION:  6730                         FACILITY:  [facility]   PHYSICIAN:  Fleet Contras, M.D.    DATE OF BIRTH:  [birth year]   DATE OF ADMISSION:  05/21/2008  DATE OF DISCHARGE:                              HISTORY & PHYSICAL   PRESENTING COMPLAINT:  Pains in the legs and arms.   HISTORY OF PRESENT ILLNESS:  Philip Rios is a 25 year old African American  gentleman with past medical history significant for hemoglobin SS,  sickle cell disease.  He came to the emergency room at [facility]  today after he awoke at about 4:00 a.m. with severe pain involving his  lower extremities from the waist down and also the upper arms.  At the  emergency room, he was in some painful distress and he received  intravenous Dilaudid with some relief.  He went home and went to sleep,  but he woke up again later on in the morning with recurrence of severe  pain and had to come back to the emergency room.  He denied having any  kind of trauma or injury.  He had no fevers or chills.  He did not have  any chest pain, shortness of breath, orthopnea, PND, or palpitations.  He had no cough, sputum production, or hemoptysis.  He had no nausea,  abdominal pain, vomiting, or diarrhea.  Denied any headaches or  dizziness.  At the emergency room, his vital signs were stable.  His  initial lab work showed hemoglobin of 8.4, was therefore admitted to the  hospital for pain management.   PAST MEDICAL HISTORY:  Sickle cell disease, hemoglobin SS.   MEDICATION HISTORY:  He is on hydroxyurea 500 mg 2-3 times daily,  ibuprofen 800 mg 1 p.o. t.i.d. p.r.n.   ALLERGIES:  He is allergic to CODEINE, which gives him a rash.   FAMILY HISTORY:  He has a family history of sickle cell disease.   SOCIAL HISTORY:  He is a Consulting civil engineer, recently returned to school and  apparently had been keeping some late night.  He  denied any use of  alcohol, tobacco, or illicit drugs.   REVIEW OF SYSTEMS:  Essentially as above.   SURGICAL HISTORY:  He has had the laparoscopic cholecystectomy done back  in February 2009.   PHYSICAL EXAMINATION:  GENERAL:  He is lying comfortably in bed, not in  acute respiratory or painful distress.  He is mildly pale.  He is deeply  icteric.  He is not cyanosed.  He is well hydrated.  NECK:  His neck is supple with no elevated JVD.  No cervical  lymphadenopathy.  CHEST:  His chest is clear to auscultation.  HEART:  Heart sounds were heard with no murmurs, no S3 gallops.  ABDOMEN:  Flat, soft, nontender, no masses.  Bowel sounds are present.  EXTREMITIES:  Shows no edema.  No calf tenderness.  There is mild  tenderness to palpation of the long bones of the upper and lower  extremities.  VITAL SIGNS:  His vital signs showed a blood pressure 122/63, heart rate  of 99, respiratory rate of 18, temperature is 99 degrees Fahrenheit, and  O2 sats on room air is 98%.   LABORATORY DATA:  Urinalysis is negative.  Reticulocyte count is 6.4,  which is mildly elevated.  His white count is 9.1, hemoglobin 8.4,  hematocrit 23.4, platelet count of 258.  His liver function tests showed  elevated AST of 65, ALT 27, alkaline phosphatase 85, and total bilirubin  is 6.8, albumin is 4.0.  BMET is within normal limits.   ASSESSMENT:  This is another admission for Philip Rios presenting with  typical symptoms of sickle cell painful crisis.   ADMISSION DIAGNOSES:  1. Sickle cell painful crisis.  2. Sickle cell anemia.  3. Hyperbilirubinemia.   PLAN OF CARE:  He will be on medical bed.  He will receive IV fluids and  IV analgesic therapy.  Monitor his anemia and transfuse with packed red  blood cells as needed.  This plan of care has been discussed with him  and his dad, who is at his bedside.  Their questions were answered.      Fleet Contras, M.D.  Electronically Signed     EA/MEDQ  D:   05/21/2008  T:  05/22/2008  Job:  [id number]

## 2011-02-13 NOTE — Consult Note (Signed)
NAME:  Philip Rios, Philip Rios NO.:  [id number]   MEDICAL RECORD NO.:  [id number]          PATIENT TYPE:  INP   LOCATION:  5114                         FACILITY:  [facility]   PHYSICIAN:  Sandria Bales. Ezzard Standing, M.D.  DATE OF BIRTH:  06-14-1986   DATE OF CONSULTATION:  11/24/2007  DATE OF DISCHARGE:                                 CONSULTATION   Why can't the date of consultation be here?   TIME OF CONSULTATION:  12:15 p.m.   REQUESTING PHYSICIAN:  Lavonda Jumbo, an emergency room physician.   CONSULTING SURGEON:  Dr. Ezzard Standing.   PRIMARY CARE PHYSICIAN:  Fleet Contras, M.D.   REASON FOR CONSULTATION:  Gallstones and biliary colic.   HISTORY OF PRESENT ILLNESS:  Mr. [name] is a 25 year old patient, patient  of Dr. Cecil Cranker, with sickle cell anemia and known gallstones,  previously asymptomatic.  He has had increased frequency of sickle cell  crisis over the past 2 years, after starting college.  She attributes  this to the stress of college life.  In regards to today's symptoms, he  developed acute onset of right upper quadrant abdominal pain at 1:00  a.m. early this morning, after eating a cheeseburger.  In the past,  pizza has caused similar pain but not as severe.  This current pain was  very severe, would wax and wane and seemed to initially go away but came  back with a vengeance and was quite constant, prompting the patient to  seek treatment in the ER.  The pain did not radiate.  This was  associated with nausea and emesis at home.  In the ER, he was found to  have a leukocytosis and transaminitis.  CT of the abdomen and pelvis was  performed initially, because he had a right upper and right middle  quadrant-type pattern to his pain.  This did not show appendicitis but  did show multiple gallstones and some new intrahepatic ductal  dilatation, as well as an in-homogenous liver.  The radiologist was  questioning possible hepatitis.  An ultrasound of the abdomen has since  been performed that again shows stones without any gallbladder wall  thickening or pericholecystic fluid, but there is a significant amount  of stones that extend into the neck of the gallbladder bladder.  His  intrahepatic ducts are better visualized and are dilated mildly from 3.5  to 5.5 mm.  Dr. Concepcion Elk will be admitting the patient, because of his  history of sickle cell, especially with recent increase admissions for  crisis over the past 2 years.  We will be consulting on the patient for  biliary colic symptoms.   REVIEW OF SYSTEMS:  As per the history of present illness, all systems  are negative except as follows:  CONSTITUTIONAL:  The patient reports fever one week ago, he attributes  to sinusitis symptoms.  GI:  As per the history present illness.  Again, he is having nausea and  vomiting at home.  After the symptoms began, he does not have any  chronic GERD symptoms.  HEMATOLOGY:  The patient describes this abdominal pain  and pain being a  completely different than his usual presentation with sickle cell  crisis.   FAMILY MEDICAL HISTORY:  Noncontributory.   SOCIAL HISTORY:  The patient does not utilize tobacco products.  He  drinks alcohol socially.  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  He is from [place], and his family  lives in [place].   PAST MEDICAL HISTORY:  1. Childhood asthma, currently asymptomatic.  2. Sickle cell anemia with baseline hemoglobin in the 9 range.   PAST SURGICAL HISTORY:  The patient denies.   ALLERGIES:  CODEINE WHICH CAUSES HIVES.   CURRENT MEDICATIONS:  Include hydroxyurea, 1000 mg daily.   PHYSICAL EXAM:  CONSTITUTIONAL/ GENERAL:  The patient is a pleasant, non-  toxic appearing male complaining of right upper and middle quadrant  abdominal pain, better after receiving pain medications in the ER.  VITAL SIGNS:  Temperature 97.1, BP 94/45, pulse 59, respirations 16.  EYES:  Sclerae are not  icteric.  This may be a chronic issue with this  patient, though given his history of sickle cell.  HEENT:  Pupils are equal and reactive to light.  Conjunctivae are clear.  Ears, nose, mouth and throat:  Ears are normal without a lesions.  Otoscopic exam was deferred.  Nose is without rhinorrhea or bleeding.  Mouth is pink and moist, without any ulcerations noted.  Posterior  pharynx is normal-appearing.  NECK:  Trachea is midline.  Thyroid is nonpalpable.  Respirations effort  is normal.  LUNGS:  Bilateral lung sounds are clear to auscultation, down to the  bases.  No wheezes or rubs.  CARDIOVASCULAR:  Heart sounds are S1 and S2.  Pulses regular.  There is  a soft either S4 versus a grade 1/6 systolic murmur, which is very soft  at the left sternal border second intercostal space, pulses, carotid,  radial, femoral and pedal are 2+ bilaterally.  There is no peripheral  edema noted.  CHEST:  Chest and breast exam was deferred.  GI:  Abdomen is soft, flat, bowel sounds are present.  ABDOMEN:  Mildly tender in the lower part of the right upper quadrant  and the right middle quadrant, not definitely associated with a positive  Murphy sign.  There are no hernias.  No obvious hepatomegaly or  splenomegaly on percussion.  I am unable to appreciate a liver border on  palpation.  Lymphatic exam was deferred.  MUSCULOSKELETAL:  Extremities are symmetrical in appearance without  clubbing or cyanosis.  There is no joint pain or effusion with passive  or active range of motion.  SKIN:  Normal in appearance without rashes, lesions, masses or scars.  NEURO:  Cranial nerves II-XII are grossly intact.  DTRs were not  checked.  Gait was not checked.  The patient was ambulatory to the ER.  Upper and lower extremities:  Sensation is intact.  PSYCHE:  The patient is oriented to time person, place and situation.  His affect is normal for situation.   LAB:  Sodium 140, potassium 4.3, CO2 27, glucose 99,  BUN 5, creatinine  0.8, AST 25, ALT 37, alkaline phosphatase 81, total bilirubin 6.9,  direct bilirubin 1.2, indirect bilirubin 5.7.  In reviewing the  patient's discharge summary, a late December hospitalization 2008 and  sickle cell crisis.  The patient's hemoglobin at that time was 9, low-  grade leukocytosis.  White count then was 10,900.  Sed rate 100, and  total bilirubin 2.6.  Today, the  patient's white count is 15,700,  hemoglobin 9.8 and platelets 505,000.  Diagnostic CT of the abdomen and  pelvis again shows an inhomogeneous liver.  Radiologist questioning  possible hepatitis.  He has mild dilatation of the intrahepatic ducts  which is new, as compared to previous CT.  Ultrasound the abdomen  demonstrates cholelithiasis extending into the gallbladder neck region  with slight prominence of the intrahepatic ductal system, ducts measure  anywhere from 3.5 to 5.5 mm.   IMPRESSION:  1. Biliary colic and cholelithiasis.  2. New mild intrahepatic ductal dilatation, significance unknown in      setting of inhomogeneous liver.  3. Sickle cell anemia, stable hemoglobin.  No evidence of acute crisis      at this time.  4. Leukocytosis, presumed secondary to biliary disease.   PLAN:  1. Dr. Concepcion Elk will be admitting the patient to assist with the      internal medicine problems, given his history of sickle cell and      repeated crisis admissions, over the past 48 months.  Dr. Ezzard Standing      discussed admisssion with Dr. Concepcion Elk.  2. Allow clear liquid diet, advance to a low-fat full liquids as      tolerated and n.p.o. after neck midnight  for planned surgical      intervention.  3. Aggressive hydration.  The patient has sickle cell, continue O2 and      new utilize morphine for pain, utilize Zofran and Tylenol for other      symptoms.  4. If no significant elevation in total bilirubin in the morning,      probably proceed with laparoscopic cholecystectomy in the morning.      Risks  and the benefits of this procedure been discussed with the      patient, per Dr. Ezzard Standing, and he agrees to proceed.  Current      ultrasound shows no evidence of stone in the common bile duct.      Begin empiric antibiotic therapy to cover biliary pathogens with      unison IV.  5. Despite the patient's young age, given the fact he has sickle cell      anemia, we will begin DVT prophylaxis with Lovenox daily and apply      PAS hose first thing in the morning, preoperatively.      Allison L. Rennis Harding, N.P.      Sandria Bales. Ezzard Standing, M.D.  Electronically Signed    ALE/MEDQ  D:  11/24/2007  T:  11/24/2007  Job:  [id number]   cc:   Fleet Contras, M.D.  Lavonda Jumbo, M.D.

## 2011-02-13 NOTE — H&P (Signed)
NAME:  Philip Rios, SARVIS NO.:  [id number]   MEDICAL RECORD NO.:  [id number]          PATIENT TYPE:  INP   LOCATION:  1301                         FACILITY:  [facility]   PHYSICIAN:  Jackie Plum, M.D.DATE OF BIRTH:  [birth year]   DATE OF ADMISSION:  09/29/2007  DATE OF DISCHARGE:                              HISTORY & PHYSICAL   CHIEF COMPLAINT:  Generalized pain.   HPI:  The patient is a 25 year old African-American gentleman with a  history of sickle cell disease and admitted with generalized pain. The  patient was said to be severe both in the extremities and the back.  Pains the same as his sickle cell pains.  He was seen in the emergency  room on September 28, 2007, and was discharged home on pain medication,  but had returned, because the pain was not relieved with p.o.  medications.  He had been afebrile with chills. No chest pain. No  shortness of breath. No abdominal pain. No nausea or vomiting.   PAST MEDICAL HISTORY:  Significant for a history of sickle cell disease  and sickle cell anemia.   MEDICATIONS:  __________  500 mg two to three times daily, __________  4  mg q.6 hours as well as OxyContin.   ALLERGIES:  CODEINE, WHICH CAUSES A RASH.   FAMILY HISTORY:  Is positive for sickle cell disease.   SOCIAL HISTORY:  The patient denies any history of smoking or illicit  drug use.   REVIEW OF SYSTEMS:  Except for above, all other systems negative and  unremarkable.   PHYSICAL EXAMINATION:  VITAL SIGNS: BP was 108/70, pulse 102,  temperature 98.2, respirations 20. GENERAL: The patient was in moderate  painful distress. Mildly tachycardic. ABDOMEN: Soft. Bowel sounds  present. No cyanosis. HEENT: Normocephalic atraumatic. Pupil equal,  round and reactive to light.   LABS:  Leukocytosis of 400,200. Hemoglobin 9.2. Hematocrit 27.0.  Platelet count 486. __________  Retic count was 8.3. __________ 210.  Sodium 140, potassium 4.0, chloride 108, glucose  98. Creatinine of 1.3.  BUN is less than 3. UA was negative for UTI.   IMPRESSION:  1. Sickle cell painful crisis.  2. Triglyceridemia.   The patient is admitted for IV fluids and pain support. Will follow his  anemia for his leukocytosis. There is evidence of focus of infection.  Leukocytosis likely related to his increased __________  hemolysis I  believe.      Jackie Plum, M.D.  Electronically Signed     GO/MEDQ  D:  09/29/2007  T:  09/29/2007  Job:  [id number]   cc:   Fleet Contras, M.D.  Fax: [phone]

## 2011-02-16 NOTE — Discharge Summary (Signed)
NAME:  Philip Rios, Philip Rios NO.:  [id number]   MEDICAL RECORD NO.:  [id number]          PATIENT TYPE:  INP   LOCATION:  6114                         FACILITY:  [facility]   PHYSICIAN:  Henrietta Hoover, MD    DATE OF BIRTH:  [birth year]   DATE OF ADMISSION:  10/29/2005  DATE OF DISCHARGE:  10/31/2005                                 DISCHARGE SUMMARY   HOSPITAL COURSE:  The patient was admitted for chest pain and fever on  October 30, 2005. She has known sickle cell disease and symptoms are likely  secondary to pain crisis. Blood cultures on Wednesday were negative. He  received Rocephin on admission and was discontinued on October 31, 2005.  Pain management with ibuprofen, but now upon discharge was asymptomatic  requiring no IV __________ for pain control.   SIGNIFICANT FINDINGS:  CBC was stable throughout hospital course. On  discharge CBC revealed white count of 3.9, hemoglobin 9.7, hematocrit 27.7,  platelet count 307,000, and retic count of 3%.   PROCEDURE:  Chest x-ray on October 30, 2005, was normal.   DIAGNOSIS:  Pain crisis secondary to sickle cell disease.   MEDICATIONS:  Hydroxyurea 1000 mg p.o. every other day.   DISCHARGE CONDITION:  Stable.   DISCHARGE INSTRUCTIONS:  To call Dr. Donnie Coffin at [phone] tomorrow, February  1st, to obtain a follow-up appointment. Office was closed today.     ______________________________  Linna Caprice, M.D.    ______________________________  Henrietta Hoover, MD    AE/MEDQ  D:  10/31/2005  T:  10/31/2005  Job:  [id number]
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
## 2011-02-16 NOTE — Discharge Summary (Signed)
NAME:  DARVELL, MONTEFORTE NO.:  [id number]   MEDICAL RECORD NO.:  [id number]          PATIENT TYPE:  INP   LOCATION:  5735                         FACILITY:  [facility]   PHYSICIAN:  Fleet Contras, M.D.    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 He has a  past medical history significant for hemoglobin SS sickle cell disease  and asthma. He complained of bilateral hip and buttock  pain of 1-day  duration without any history of trauma or injury.  He had no fevers or  chills. His vital signs stable.  He was not in any acute respiratory or  painful distress. He was mildly icteric.  He is cyanotic and well-  hydrated.  His chest was clear to auscultation.  Abdomen was benign.   LABORATORY DATA:  Hemoglobin of 10.1, hematocrit 28.7, white count of  9.6 and a platelet count of 425. Reticulocyte count was 8.1%.  Urinalysis was negative.   Patient admitted to the medical floor and started on IV fluids and IV  analgesia.   HOSPITAL COURSE:  His symptoms improved significantly within 24-48  hours. On June 26, 2007, he was feeling much better and felt ready  to go home.   DISCHARGE DIAGNOSES:  1. Sickle cell painful crisis.  2. Sickle cell anemia.   DISCHARGE MEDICATIONS:  1. Hydroxyurea 500 mg 2-3 tablets daily.  2. Dilaudid 4 mg one p.o. q.6 p.r.n.   He was to follow-up with me in the office in 2-4 weeks.   CONDITION ON DISCHARGE:  Stable.   DISPOSITION:  Home.      Fleet Contras, M.D.  Electronically Signed     EA/MEDQ  D:  07/22/2007  T:  07/23/2007  Job:  [id number]

## 2011-02-16 NOTE — Discharge Summary (Signed)
NAME:  Philip Rios, Philip Rios NO.:  [id number]   MEDICAL RECORD NO.:  [id number]          PATIENT TYPE:  INP   LOCATION:  5011                         FACILITY:  [facility]   PHYSICIAN:  Lonia Blood, M.D.      DATE OF BIRTH:  [birth year]   DATE OF ADMISSION:  06/28/2006  DATE OF DISCHARGE:                                 DISCHARGE SUMMARY   PRIMARY CARE PHYSICIAN:  Patient is currently unassigned, but will be  followed up by me.   DISCHARGE DIAGNOSES:  1. Sickle cell painful crisis, currently resolved.  2. Sickle cell anemia with hemolysis.  3. Acute hemolysis with increased transaminases and hyperbilirubinemia.  4. Transient fever with unknown infectious source.  5. Asthma.   DISCHARGE MEDICATIONS:  1. Hydroxyurea 1500 mg alternating with 1000 mg.  2. Tylenol as needed for pain.   DISPOSITION:  Patient has been pain free for a couple of days now.  He has  been seen by hematology oncology.  He will have a followup by Dr. Arline Asp  in 2-3 weeks.  And also a followup with new physician in about a week.  His  total bilirubin has continued to decrease and will be rechecked as an  outpatient.   PROCEDURES PERFORMED THIS ADMISSION:  1. Chest x-ray on September 28th that shows no active disease.  2. Abdominal ultrasound on September 30th that showed gallstone without      acute cholecystitis, left pleural effusion, normal-appearing liver.      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   CONSULTATIONS:  Dr. Arline Asp hematologist.   BRIEF HISTORY AND PHYSICAL:  Please refer to dictated history and physical  by Dr. Buena Irish on admission, and previous discharge summary on  July 02, 2006, as dictated by Michaelyn Barter.  In short, however,  patient was admitted essentially secondary to having an acute sickle cell  crisis.  Patient is an 25 year old patient, student of [employer].  He  was brought in mainly for management of his sickle cell disease and  subsequently the patient had some of IV medication in the hospital.  He has  not required any medication; however, in the past 24-48 hours, he has been  pain-free.  He has had his hemoglobin gradually drop.  At this point,  however, it  has been stable at 8.0.  He has been evaluated by hematology oncology and  the decision is that patient is having some mild hemolysis.  He will have  outpatient followup to ensure that his blood count is remaining adequate.  For hospital course and other information, please see [facility]'s  dictation of July 02, 2006.      Lonia Blood, M.D.  Electronically Signed     LG/MEDQ  D:  07/04/2006  T:  07/04/2006  Job:  [id number]
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
## 2011-02-16 NOTE — Discharge Summary (Signed)
NAME:  Philip Rios, DORITY NO.:  [id number]   MEDICAL RECORD NO.:  [id number]          PATIENT TYPE:  INP   LOCATION:  5011                         FACILITY:  [facility]   PHYSICIAN:  Michaelyn Barter, M.D. DATE OF BIRTH:  1986-08-21   DATE OF ADMISSION:  06/28/2006  DATE OF DISCHARGE:                                 DISCHARGE SUMMARY   FINAL DIAGNOSIS:  1. Sickle cell pain crisis.  2. 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  3. Elevated transaminases.  4. Fever with an unknown source.   PROCEDURES.:  Chest x-ray completed September28, 2007, and an ultrasound  of the abdomen completed September30,2007.   HISTORY OF PRESENT ILLNESS:  [name] is an 25 year old gentleman with past  medical history of sickle cell disease.  He arrived with the chief complaint  of pain.  The pain started at approximately 4:00 in the morning.  He stated  that the day prior to this admission, he was completely pain-free and in his  usual state of health.  He is a Consulting civil engineer at [employer].  He had  been seen in the ER earlier in the morning, provided intravenous fluids as  well as Dilaudid.  He began to feel better and was released home.  Later in  the afternoon, his pain returned and he came back to the ER.  It was felt  that his pain was associated with crisis.  He describes pain as involving  his chest, his abdomen, his legs, and he complained of fever.  He also  stated that he usually has fevers when he has his crisis.   PAST MEDICAL HISTORY:  For past medical history, please see that dictated by  Dr. Buena Irish.   HOSPITAL COURSE:  1. Sickle cell pain crisis.  The patient was admitted for pain control.      He was started on Dilaudid intravenously.  Hydroxyurea was also      provided to the patient.  Over the course of his hospitalization, the      patient's pain gradually resolved and actually by approximately two  days after the patient's hospitalization, he indicated that he felt      much better with only minimal amounts of pain.  As of today, July 02, 2006, the patient is completely pain-free.  2. Anemia.  When the patient first arrived, his hemoglobin was found to be      9.6.  The patient states that he is chronically in the 9 range      secondary to his sickle cell disease, however, over the past days of      his admission, he has gone from 9.7 to 9.1 to 8.4 to 8.1 yesterday, and      as of today, he is down to 7.7.  I have ordered additional labs on this      patient to look for any signs of hemolysis.  Again, the patient's pain      has resolved, however, his  hemoglobin continues to decline.  The      patient has had an LDH completed which is 432 which is elevated.      Likewise, haptoglobin is less than 6.  Both of these numbers appeared      to go along with a picture of hemolysis.  I am not sure whether this is      related to his sickle cell pain crisis or not.  The patient himself      reports feeling better each day, however, given the fact that his      hemoglobin continues to decline and given my concerns of the presence      of hemolysis, I have consulted hematology, Dr. Arline Asp, today, July 02, 2006.  Initially when the patient was admitted, it appeared that he      had a typical sickle cell pain crisis and I had hoped that, as most      sicklers do, as their pain gets better, the hemoglobins typically      stabilize.  However, with Mr. Tawil, this has not been the case.      Therefore, I have asked hematology to take a look and give some further      recommendations.  3. Elevated transaminases.  Over the past couple of days, the patient has      not mentioned any abdominal pain to me.  I have, however, obtained an      ultrasound of the patient's abdomen on June 30, 2006, out of      concern over the patient's bilirubin being so elevated.  The final       impression was that there were gallstones present without acute      cholecystitis.  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  Again, the elevated transaminases more      than likely are secondary to the hemolysis that the patient is more      than likely undergoing.  The patient's bilirubin was 6.4 on June 30, 2006.  By July 02, 2006, at 4:50 in the morning, it was 6.5.  A      fractionated bilirubin was ordered.  The total bilirubin was 7.2, the      direct bilirubin 0.7, the indirect bilirubin 6.5.  Again, I have      consulted hematology and I am awaiting consultation from them.  4. Fever.  The etiology of the patient's fever is questionable.  It may be      related to the patient's sickle cell pain crisis as he mentioned during      his initial evaluation in the ER.  Blood cultures have been ordered and      they were negative x2.  Likewise, urinalysis plus culture was ordered      and it was found to be negative.  A chest x-ray was completed on      June 28, 2006, and it was found to reveal no active disease.  The      patient was started on empiric ceftriaxone secondary to this.  5. This brings me up the July 02, 2006.  Issues needing to be resolved      are, number one, the declining hemoglobin.  Once this stabilizes, the  patient may be able to be discharged home.      Michaelyn Barter, M.D.  Electronically Signed     OR/MEDQ  D:  07/02/2006  T:  07/03/2006  Job:  [id number]

## 2011-02-16 NOTE — H&P (Signed)
NAME:  Philip, Rios NO.:  [id number]   MEDICAL RECORD NO.:  [id number]          PATIENT TYPE:  INP   LOCATION:  5011                         FACILITY:  [facility]   PHYSICIAN:  Thomasenia Bottoms, MDDATE OF BIRTH:  Feb 15, 1986   DATE OF ADMISSION:  06/28/2006  DATE OF DISCHARGE:                                HISTORY & PHYSICAL   CHIEF COMPLAINT:  Pain.   HISTORY OF PRESENT ILLNESS:  Mr. Philip Rios is an 25 year old with sickle cell  who presents today with likely sickle cell crisis.  His pain started about 4  o'clock this morning.  Yesterday, he felt completely fine and is in his  usual state of health.  He was seen in the emergency department early this  morning.  After IV fluids and Dilaudid, he felt much better and was  released.  Later in the afternoon, his pain returned at home, and he came  back to the emergency department.  The patient says mostly his sickle cell  crises involve pain in his head, though I see his last admission in January,  he also had pain in his chest.  He says the pain this time is a little bit  unusual because it is chest, belly, and legs as opposed to just his head.  He is having fever which started this afternoon.  He says he always has  fevers when he has a crisis.  He denies any shortness of breath.  No sore  throat, no productive cough, and again was feeling at his baseline  yesterday.   PAST MEDICAL HISTORY:  1. Sickle cell anemia.  2. History of asthma which is completely quiet.  He takes no medications      and apparently has no symptoms.  3. He has a history of hand surgery last surgery of the left hand.   MEDICATIONS ON ARRIVAL:  Hydroxyurea 500 mg two tablets one day alternating  with three tablets the next.   SOCIAL HISTORY:  No alcohol, no cigarettes, no illicit drug use.   FAMILY HISTORY:  Significant for multiple family members with diabetes.  He  has an aunt with sickle cell trait.   REVIEW OF SYSTEMS:   CONSTITUTIONAL:  Appetite excellent.  No weight loss.  HEENT:  No headache, double vision, sore throat.  CARDIOVASCULAR:  Did have  some chest pain with this crisis.  No orthopnea or lower extremity edema.  RESPIRATORY:  No hemoptysis, no productive cough.  He does have a mild dry  cough.  GI:  He had some belly pain with the crisis.  No constipation,  diarrhea, or vomiting.  He has not vomited any blood or seen any blood in  his stool.  GU:  No hematuria.  MUSCULOSKELETAL:  Denies joint pains prior  to today.  NEUROLOGIC:  Independent of his ADLs and IADLs.  No seizure.  No  slurred speech.  No asymmetric weakness or paresthesias.  INTEGUMENTARY:  No  open lesions or rashes reported.  HEMATOLOGIC:  Sickle cell anemia but no  history of bruising easily.   PHYSICAL EXAMINATION:  VITAL SIGNS:  In the emergency department,  temperature 100.7, pulse 98, respiratory rate 18, pulse oximetry 98% on 2 L  nasal cannula.  GENERAL:  He is a thin young man in no acute distress.  HEENT:  Normocephalic, atraumatic.  His pupils are round.  His sclerae are  quite icteric.  Oral mucosa moist.  NECK:  Supple.  No lymphadenopathy, no thyromegaly, no jugular venous  distension.  CARDIAC:  Tachycardic and regular.  LUNGS:  Clear to auscultation bilaterally with no wheezing, rhonchi, or  rales.  ABDOMEN:  Soft, nontender, nondistended.  He does have bowel sounds.  EXTREMITIES:  No evidence of clubbing, cyanosis, or edema.  He has palpable  DP pulses bilaterally.  NEUROLOGIC:  His cranial nerves II-XII are intact grossly.  He is alert and  oriented x3.  He has no slurred speech.  He has 5/5 strength in his upper  and lower extremities.  His sensory exam is intact grossly in his upper and  lower extremities.  SKIN:  Intact without open lesions or rash.  MUSCULOSKELETAL:  Normal with no effusions of his joints and good range of  motion.   LABORATORY DATA:  White count 9.6, hemoglobin 9.7, hematocrit 27.3,  platelet  count 345.  Sodium 137, potassium 4.5, chloride 105, bicarb 25, glucose 104,  BUN 5, creatinine 0.6.  Albumin 3.6, AST 72, ALT 26, alkaline phosphatase  121, total bilirubin 7.0.  Reticulocyte count is 88.4.   ASSESSMENT AND PLAN:  This is an 25 year old with sickle cell crisis and  fever.  The plan is to admit him to the hospital.  We will put him on large  amounts of intravenous fluids, as needed intravenous Dilaudid.  We will go  ahead and put him on intravenous Rocephin because of the fever.  We will  check blood cultures.  They have been drawn and are actually pending at this  time.  We will continue his hydroxyurea.  His pain is already significantly  improved at the time of my evaluation.  Will continue to monitor his  hemoglobin.  I expect that it will drop tomorrow.  We will monitor him  carefully.   NOTE:  The patient also tells me that he is looking for an adult physician,  as he has been followed by a pediatrician for all of these years.      Thomasenia Bottoms, MD  Electronically Signed     CVC/MEDQ  D:  06/28/2006  T:  06/30/2006  Job:  [id number]   cc:   Tresa Garter, M.D.
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
## 2011-05-16 ENCOUNTER — Emergency Department (HOSPITAL_COMMUNITY)
Admission: EM | Admit: 2011-05-16 | Discharge: 2011-05-16 | Disposition: A | Discharge status: Home / Self Care | Payer: Medicaid Other | Attending: Emergency Medicine | Admitting: Emergency Medicine

## 2011-05-16 ENCOUNTER — Emergency Department (HOSPITAL_COMMUNITY): Payer: Medicaid Other

## 2011-05-16 DIAGNOSIS — R1011 Right upper quadrant pain: Secondary | ICD-10-CM | POA: Insufficient documentation

## 2011-05-16 DIAGNOSIS — R160 Hepatomegaly, not elsewhere classified: Secondary | ICD-10-CM | POA: Insufficient documentation

## 2011-05-16 DIAGNOSIS — Z79899 Other long term (current) drug therapy: Secondary | ICD-10-CM | POA: Insufficient documentation

## 2011-05-16 DIAGNOSIS — R17 Unspecified jaundice: Secondary | ICD-10-CM | POA: Insufficient documentation

## 2011-05-16 DIAGNOSIS — M549 Dorsalgia, unspecified: Secondary | ICD-10-CM | POA: Insufficient documentation

## 2011-05-16 DIAGNOSIS — D5700 Hb-SS disease with crisis, unspecified: Secondary | ICD-10-CM | POA: Insufficient documentation

## 2011-05-16 LAB — COMPREHENSIVE METABOLIC PANEL
ALT: 27 U/L (ref 0–53)
BUN: 5 mg/dL — ABNORMAL LOW (ref 6–23)
Calcium: 9.4 mg/dL (ref 8.4–10.5)
Creatinine, Ser: <0.47 mg/dL — ABNORMAL LOW (ref 0.50–1.35)
Glucose, Bld: 92 mg/dL (ref 70–99)
Sodium: 138 mEq/L (ref 135–145)
Total Protein: 7.8 g/dL (ref 6.0–8.3)

## 2011-05-16 LAB — RETICULOCYTES: Retic Ct Pct: 23.1 % — ABNORMAL HIGH (ref 0.4–3.1)

## 2011-05-16 LAB — CBC
MCH: 33.3 pg (ref 26.0–34.0)
Platelets: 310 10*3/uL (ref 150–400)
RBC: 2.46 MIL/uL — ABNORMAL LOW (ref 4.22–5.81)
RDW: 23.6 % — ABNORMAL HIGH (ref 11.5–15.5)

## 2011-05-16 LAB — DIFFERENTIAL
Band Neutrophils: 0 % (ref 0–10)
Basophils Absolute: 0 10*3/uL (ref 0.0–0.1)
Blasts: 0 %
Metamyelocytes Relative: 0 %
Monocytes Absolute: 0.5 10*3/uL (ref 0.1–1.0)
Promyelocytes Absolute: 0 %

## 2011-06-21 LAB — CBC
HCT: 23.7 — ABNORMAL LOW
HCT: 27.1 — ABNORMAL LOW
Hemoglobin: 8.3 — ABNORMAL LOW
Hemoglobin: 8.8 — ABNORMAL LOW
MCHC: 35.7
MCV: 96.6
Platelets: 489 — ABNORMAL HIGH
RBC: 2.46 — ABNORMAL LOW
RBC: 2.56 — ABNORMAL LOW
RBC: 2.79 — ABNORMAL LOW
WBC: 13.1 — ABNORMAL HIGH
WBC: 7.7
WBC: 8.8

## 2011-06-21 LAB — COMPREHENSIVE METABOLIC PANEL
ALT: 20
AST: 25
CO2: 30
Calcium: 9.1
Chloride: 101
Creatinine, Ser: 0.53
GFR calc Af Amer: >60
GFR calc non Af Amer: >60
Glucose, Bld: 99
Total Bilirubin: 2.6 — ABNORMAL HIGH

## 2011-06-21 LAB — DIFFERENTIAL
Basophils Relative: 0
Eosinophils Relative: 0
Lymphocytes Relative: 5 — ABNORMAL LOW
Monocytes Relative: 8
Neutrophils Relative %: 87 — ABNORMAL HIGH

## 2011-06-21 LAB — RETICULOCYTES
Retic Count, Absolute: 191.7 — ABNORMAL HIGH
Retic Ct Pct: 7.1 — ABNORMAL HIGH

## 2011-06-21 LAB — SEDIMENTATION RATE: Sed Rate: 100 — ABNORMAL HIGH

## 2011-06-22 LAB — DIFFERENTIAL
Basophils Absolute: 0
Basophils Relative: 0
Eosinophils Absolute: 0
Eosinophils Absolute: 0.3
Eosinophils Relative: 0
Lymphocytes Relative: 20
Lymphocytes Relative: 45
Lymphocytes Relative: 7 — ABNORMAL LOW
Lymphs Abs: 2.3
Monocytes Relative: 8
Monocytes Relative: 0 — ABNORMAL LOW
Monocytes Relative: 9
Myelocytes: 0
Neutro Abs: 8.1 — ABNORMAL HIGH
Neutro Abs: 14.6 — ABNORMAL HIGH
Neutrophils Relative %: 42 — ABNORMAL LOW
Neutrophils Relative %: 93 — ABNORMAL HIGH
nRBC: 0

## 2011-06-22 LAB — CBC
HCT: 27.9 — ABNORMAL LOW
HCT: 26.6 — ABNORMAL LOW
Hemoglobin: 10 — ABNORMAL LOW
MCHC: 34.5
MCHC: 35.1
MCHC: 35.3
MCV: 98.1
MCV: 100.2 — ABNORMAL HIGH
MCV: 100.3 — ABNORMAL HIGH
RBC: 2.66 — ABNORMAL LOW
RBC: 2.77 — ABNORMAL LOW
RDW: 23.5 — ABNORMAL HIGH
WBC: 11.6 — ABNORMAL HIGH
WBC: 15.7 — ABNORMAL HIGH

## 2011-06-22 LAB — CROSSMATCH

## 2011-06-22 LAB — I-STAT 8, (EC8 V) (CONVERTED LAB)
BUN: 7
BUN: 5 — ABNORMAL LOW
Chloride: 107
Chloride: 106
Glucose, Bld: 105 — ABNORMAL HIGH
HCT: 31 — ABNORMAL LOW
Hemoglobin: 10.5 — ABNORMAL LOW
Operator id: 277751
Potassium: 4.1
Potassium: 4.3
Sodium: 140
pH, Ven: 7.518 — ABNORMAL HIGH

## 2011-06-22 LAB — HEPATIC FUNCTION PANEL
AST: 75 — ABNORMAL HIGH
Albumin: 4
Alkaline Phosphatase: 81
Total Bilirubin: 6.9 — ABNORMAL HIGH

## 2011-06-22 LAB — COMPREHENSIVE METABOLIC PANEL
ALT: 75 — ABNORMAL HIGH
AST: 69 — ABNORMAL HIGH
AST: 84 — ABNORMAL HIGH
BUN: 1 — ABNORMAL LOW
CO2: 25
Calcium: 8.6
Calcium: 8.8
Creatinine, Ser: 0.54
Creatinine, Ser: 0.54
GFR calc Af Amer: >60
GFR calc Af Amer: >60
GFR calc non Af Amer: >60
GFR calc non Af Amer: >60
Sodium: 135
Total Protein: 6.9

## 2011-06-22 LAB — URINALYSIS, ROUTINE W REFLEX MICROSCOPIC
Hgb urine dipstick: NEGATIVE
Specific Gravity, Urine: 1.012
Urobilinogen, UA: >8 — ABNORMAL HIGH
pH: 7

## 2011-06-22 LAB — POCT I-STAT CREATININE
Creatinine, Ser: 1.6 — ABNORMAL HIGH
Creatinine, Ser: 0.8
Operator id: 288331
Operator id: 277751

## 2011-06-22 LAB — PROTIME-INR
INR: 1.4
Prothrombin Time: 17.9 — ABNORMAL HIGH

## 2011-06-22 LAB — LIPASE, BLOOD: Lipase: 16

## 2011-06-22 LAB — HEMOGLOBIN AND HEMATOCRIT, BLOOD: Hemoglobin: 8.1 — ABNORMAL LOW

## 2011-06-22 LAB — SEDIMENTATION RATE: Sed Rate: 28 — ABNORMAL HIGH

## 2011-06-27 LAB — CBC
Hemoglobin: 8.8 — ABNORMAL LOW
MCHC: 35.9
MCV: 103.1 — ABNORMAL HIGH
RBC: 2.37 — ABNORMAL LOW

## 2011-06-27 LAB — DIFFERENTIAL
Basophils Relative: 1
Eosinophils Relative: 4
Lymphs Abs: 2.1
Monocytes Absolute: 1

## 2011-06-28 LAB — DIFFERENTIAL
Band Neutrophils: 0
Basophils Absolute: 0
Basophils Absolute: 0.1
Basophils Relative: 0
Eosinophils Absolute: 1.5 — ABNORMAL HIGH
Eosinophils Relative: 13 — ABNORMAL HIGH
Lymphs Abs: 2.4
Metamyelocytes Relative: 0
Monocytes Relative: 7
Myelocytes: 0
Neutro Abs: 3.9
Neutrophils Relative %: 39 — ABNORMAL LOW
Neutrophils Relative %: 56
Promyelocytes Absolute: 0

## 2011-06-28 LAB — RETICULOCYTES
RBC.: 2.46 — ABNORMAL LOW
Retic Count, Absolute: 196.8 — ABNORMAL HIGH
Retic Count, Absolute: 214 — ABNORMAL HIGH
Retic Ct Pct: 7.6 — ABNORMAL HIGH
Retic Ct Pct: 8.7 — ABNORMAL HIGH

## 2011-06-28 LAB — BASIC METABOLIC PANEL
BUN: 6
CO2: 23
Calcium: 8.9
Calcium: 9
Chloride: 106
Creatinine, Ser: 0.44
Creatinine, Ser: 0.48
GFR calc Af Amer: >60
GFR calc non Af Amer: >60
Glucose, Bld: 116 — ABNORMAL HIGH
Sodium: 136

## 2011-06-28 LAB — CBC
Hemoglobin: 9.4 — ABNORMAL LOW
MCHC: 34.9
MCV: 104.4 — ABNORMAL HIGH
Platelets: 411 — ABNORMAL HIGH
RBC: 2.63 — ABNORMAL LOW
RDW: 21.9 — ABNORMAL HIGH
WBC: 10.6 — ABNORMAL HIGH

## 2011-06-28 LAB — URINALYSIS, ROUTINE W REFLEX MICROSCOPIC
Bilirubin Urine: NEGATIVE
Glucose, UA: NEGATIVE
Ketones, ur: NEGATIVE
Nitrite: NEGATIVE
Protein, ur: NEGATIVE
pH: 7

## 2011-06-28 LAB — LACTATE DEHYDROGENASE: LDH: 504 — ABNORMAL HIGH

## 2011-06-29 LAB — RETICULOCYTES
RBC.: 2.39 — ABNORMAL LOW
Retic Ct Pct: 9.4 — ABNORMAL HIGH
Retic Ct Pct: 6.8 — ABNORMAL HIGH

## 2011-06-29 LAB — URINE MICROSCOPIC-ADD ON

## 2011-06-29 LAB — POCT I-STAT, CHEM 8
Creatinine, Ser: 0.6
HCT: 26 — ABNORMAL LOW
Hemoglobin: 8.8 — ABNORMAL LOW
Sodium: 139
TCO2: 26

## 2011-06-29 LAB — CBC
HCT: 24.1 — ABNORMAL LOW
HCT: 26.5 — ABNORMAL LOW
MCHC: 36.8 — ABNORMAL HIGH
MCHC: 35.4
MCV: 105.2 — ABNORMAL HIGH
MCV: 105.5 — ABNORMAL HIGH
Platelets: 304
Platelets: 278
RDW: 23 — ABNORMAL HIGH
RDW: 20.9 — ABNORMAL HIGH

## 2011-06-29 LAB — BASIC METABOLIC PANEL
BUN: 5 — ABNORMAL LOW
Chloride: 101
Creatinine, Ser: 0.59
Glucose, Bld: 111 — ABNORMAL HIGH
Potassium: 3.9

## 2011-06-29 LAB — DIFFERENTIAL
Band Neutrophils: 0
Blasts: 0
Metamyelocytes Relative: 0
Monocytes Absolute: 0.4
Monocytes Relative: 4
Myelocytes: 0
Promyelocytes Absolute: 0

## 2011-06-29 LAB — URINALYSIS, ROUTINE W REFLEX MICROSCOPIC
Bilirubin Urine: NEGATIVE
Nitrite: NEGATIVE
Protein, ur: 30 — AB
Urobilinogen, UA: 2 — ABNORMAL HIGH

## 2011-07-02 LAB — CBC
HCT: 26.4 — ABNORMAL LOW
Hemoglobin: 9.3 — ABNORMAL LOW
WBC: 10.6 — ABNORMAL HIGH

## 2011-07-02 LAB — DIFFERENTIAL
Basophils Relative: 0
Eosinophils Relative: 5
Lymphocytes Relative: 12
Neutrophils Relative %: 74

## 2011-07-02 LAB — RETICULOCYTES
RBC.: 2.41 — ABNORMAL LOW
Retic Count, Absolute: 255.5 — ABNORMAL HIGH

## 2011-07-06 LAB — CROSSMATCH

## 2011-07-06 LAB — URINALYSIS, ROUTINE W REFLEX MICROSCOPIC
Bilirubin Urine: NEGATIVE
Glucose, UA: NEGATIVE
Hgb urine dipstick: NEGATIVE
Ketones, ur: NEGATIVE
Protein, ur: NEGATIVE

## 2011-07-06 LAB — POCT I-STAT CREATININE
Creatinine, Ser: 1.3
Operator id: 151321

## 2011-07-06 LAB — I-STAT 8, (EC8 V) (CONVERTED LAB)
BUN: <3 — ABNORMAL LOW
Chloride: 108
Glucose, Bld: 96
pCO2, Ven: 36 — ABNORMAL LOW
pH, Ven: 7.476 — ABNORMAL HIGH

## 2011-07-06 LAB — CBC
HCT: 24.5 — ABNORMAL LOW
HCT: 25.4 — ABNORMAL LOW
HCT: 17.8 — ABNORMAL LOW
Hemoglobin: 8.7 — ABNORMAL LOW
Hemoglobin: 6.4 — CL
Hemoglobin: 9 — ABNORMAL LOW
MCHC: 35.3
MCHC: 36.1 — ABNORMAL HIGH
MCV: 101 — ABNORMAL HIGH
Platelets: 486 — ABNORMAL HIGH
RBC: 1.8 — ABNORMAL LOW
RBC: 2.58 — ABNORMAL LOW
RDW: 20.8 — ABNORMAL HIGH
RDW: 24 — ABNORMAL HIGH
RDW: 23.3 — ABNORMAL HIGH
WBC: 14.8 — ABNORMAL HIGH

## 2011-07-06 LAB — BASIC METABOLIC PANEL
CO2: 24
CO2: 30
GFR calc Af Amer: >60
Glucose, Bld: 131 — ABNORMAL HIGH
Glucose, Bld: 94
Potassium: 3.6
Potassium: 3.9
Sodium: 138
Sodium: 140

## 2011-07-06 LAB — DIFFERENTIAL
Basophils Absolute: 0
Basophils Absolute: 0
Basophils Relative: 0
Eosinophils Relative: 2
Eosinophils Relative: 3
Lymphocytes Relative: 9 — ABNORMAL LOW
Monocytes Absolute: 0.9
Neutro Abs: 11.6 — ABNORMAL HIGH

## 2011-07-06 LAB — RETICULOCYTES: Retic Ct Pct: 8.3 — ABNORMAL HIGH

## 2011-07-07 ENCOUNTER — Emergency Department (HOSPITAL_COMMUNITY)
Admission: EM | Admit: 2011-07-07 | Discharge: 2011-07-07 | Disposition: A | Discharge status: Home / Self Care | Payer: Medicaid Other | Attending: Emergency Medicine | Admitting: Emergency Medicine

## 2011-07-07 DIAGNOSIS — D5700 Hb-SS disease with crisis, unspecified: Secondary | ICD-10-CM | POA: Insufficient documentation

## 2011-07-07 DIAGNOSIS — N390 Urinary tract infection, site not specified: Secondary | ICD-10-CM | POA: Insufficient documentation

## 2011-07-07 LAB — CBC
HCT: 18.9 % — ABNORMAL LOW (ref 39.0–52.0)
Hemoglobin: 7.5 g/dL — ABNORMAL LOW (ref 13.0–17.0)
RBC: 2.28 MIL/uL — ABNORMAL LOW (ref 4.22–5.81)
WBC: 12.9 10*3/uL — ABNORMAL HIGH (ref 4.0–10.5)

## 2011-07-07 LAB — POCT I-STAT, CHEM 8
Chloride: 107 mEq/L (ref 96–112)
Glucose, Bld: 95 mg/dL (ref 70–99)
HCT: 21 % — ABNORMAL LOW (ref 39.0–52.0)
Hemoglobin: 7.1 g/dL — ABNORMAL LOW (ref 13.0–17.0)
Potassium: 4 mEq/L (ref 3.5–5.1)
Sodium: 141 mEq/L (ref 135–145)

## 2011-07-07 LAB — URINALYSIS, ROUTINE W REFLEX MICROSCOPIC
Nitrite: NEGATIVE
Protein, ur: NEGATIVE mg/dL
Specific Gravity, Urine: 1.012 (ref 1.005–1.030)
Urobilinogen, UA: 1 mg/dL (ref 0.0–1.0)

## 2011-07-07 LAB — DIFFERENTIAL
Basophils Absolute: 0.1 10*3/uL (ref 0.0–0.1)
Eosinophils Relative: 3 % (ref 0–5)
Lymphocytes Relative: 13 % (ref 12–46)
Lymphs Abs: 1.7 10*3/uL (ref 0.7–4.0)
Monocytes Relative: 7 % (ref 3–12)

## 2011-07-07 LAB — URINE MICROSCOPIC-ADD ON

## 2011-07-07 LAB — RETICULOCYTES
RBC.: 2.28 MIL/uL — ABNORMAL LOW (ref 4.22–5.81)
Retic Count, Absolute: 410.4 10*3/uL — ABNORMAL HIGH (ref 19.0–186.0)
Retic Ct Pct: 18 % — ABNORMAL HIGH (ref 0.4–3.1)

## 2011-07-08 LAB — URINE CULTURE
Colony Count: NO GROWTH
Culture  Setup Time: 201210061820

## 2011-07-09 LAB — DIFFERENTIAL
Basophils Absolute: 0
Eosinophils Absolute: 0.3
Eosinophils Relative: 3
Lymphocytes Relative: 16
Neutrophils Relative %: 73

## 2011-07-09 LAB — POCT CARDIAC MARKERS
CKMB, poc: <1 — ABNORMAL LOW
Operator id: 257131

## 2011-07-09 LAB — I-STAT 8, (EC8 V) (CONVERTED LAB)
BUN: 4 — ABNORMAL LOW
Chloride: 108
Glucose, Bld: 90
Hemoglobin: 11.2 — ABNORMAL LOW
Potassium: 4.2
Sodium: 142
TCO2: 27

## 2011-07-09 LAB — CBC
HCT: 28.4 — ABNORMAL LOW
MCV: 105.6 — ABNORMAL HIGH
Platelets: 415 — ABNORMAL HIGH
RDW: 21 — ABNORMAL HIGH
WBC: 10.7 — ABNORMAL HIGH

## 2011-07-09 LAB — RETICULOCYTES
RBC.: 2.72 — ABNORMAL LOW
Retic Ct Pct: 4.8 — ABNORMAL HIGH

## 2011-07-10 LAB — BASIC METABOLIC PANEL
BUN: 4 — ABNORMAL LOW
CO2: 27
Chloride: 99
Glucose, Bld: 98
Potassium: 4.2

## 2011-07-10 LAB — CBC
HCT: 25 — ABNORMAL LOW
HCT: 25.1 — ABNORMAL LOW
HCT: 26.6 — ABNORMAL LOW
Hemoglobin: 8 — ABNORMAL LOW
Hemoglobin: 8.6 — ABNORMAL LOW
Hemoglobin: 8.7 — ABNORMAL LOW
Hemoglobin: 9.2 — ABNORMAL LOW
MCHC: 34.5
MCHC: 35.5
MCV: 107.6 — ABNORMAL HIGH
MCV: 107.7 — ABNORMAL HIGH
Platelets: 333
Platelets: 381
Platelets: 425 — ABNORMAL HIGH
Platelets: 494 — ABNORMAL HIGH
RBC: 2.33 — ABNORMAL LOW
RDW: 18.4 — ABNORMAL HIGH
RDW: 19.5 — ABNORMAL HIGH
RDW: 20.5 — ABNORMAL HIGH
WBC: 10.2
WBC: 12.1 — ABNORMAL HIGH

## 2011-07-10 LAB — DIFFERENTIAL
Basophils Relative: 0
Basophils Relative: 1
Eosinophils Absolute: 0.1 — ABNORMAL LOW
Eosinophils Relative: 2
Lymphs Abs: 0.9
Lymphs Abs: 2.2
Monocytes Absolute: 1.2 — ABNORMAL HIGH
Monocytes Relative: 13 — ABNORMAL HIGH
Neutro Abs: 6.6
Neutro Abs: 7.9 — ABNORMAL HIGH
Neutrophils Relative %: 77

## 2011-07-10 LAB — URINALYSIS, ROUTINE W REFLEX MICROSCOPIC
Bilirubin Urine: NEGATIVE
Glucose, UA: NEGATIVE
Hgb urine dipstick: NEGATIVE
Ketones, ur: NEGATIVE
Nitrite: NEGATIVE
Protein, ur: NEGATIVE
Urobilinogen, UA: 1
pH: 6

## 2011-07-10 LAB — CULTURE, BLOOD (ROUTINE X 2)

## 2011-07-10 LAB — COMPREHENSIVE METABOLIC PANEL
ALT: 23
ALT: 24
AST: 43 — ABNORMAL HIGH
Albumin: 3.6
Alkaline Phosphatase: 85
BUN: 5 — ABNORMAL LOW
Calcium: 9
Chloride: 106
GFR calc Af Amer: >60
Potassium: 4.1
Sodium: 133 — ABNORMAL LOW
Total Bilirubin: 4.5 — ABNORMAL HIGH
Total Protein: 8.2

## 2011-07-10 LAB — URINE CULTURE: Colony Count: NO GROWTH

## 2011-07-12 LAB — CBC
Hemoglobin: 10.1 — ABNORMAL LOW
MCHC: 35.5
MCV: 109.7 — ABNORMAL HIGH
Platelets: 413 — ABNORMAL HIGH
RBC: 2.44 — ABNORMAL LOW
RBC: 2.66 — ABNORMAL LOW
WBC: 9.5

## 2011-07-12 LAB — DIFFERENTIAL
Basophils Relative: 1
Eosinophils Relative: 3
Lymphocytes Relative: 32
Monocytes Absolute: 0.8 — ABNORMAL HIGH
Monocytes Relative: 8
Neutrophils Relative %: 56

## 2011-07-12 LAB — CULTURE, BLOOD (ROUTINE X 2): Culture: NO GROWTH

## 2011-07-12 LAB — I-STAT 8, (EC8 V) (CONVERTED LAB)
Bicarbonate: 25.3 — ABNORMAL HIGH
Chloride: 105
HCT: 32 — ABNORMAL LOW
Hemoglobin: 10.9 — ABNORMAL LOW
Operator id: 270111
pCO2, Ven: 41.3 — ABNORMAL LOW
pH, Ven: 7.395 — ABNORMAL HIGH

## 2011-07-12 LAB — RETICULOCYTES
RBC.: 2.63 — ABNORMAL LOW
Retic Count, Absolute: 213 — ABNORMAL HIGH
Retic Ct Pct: 8.1 — ABNORMAL HIGH

## 2011-07-12 LAB — URINALYSIS, ROUTINE W REFLEX MICROSCOPIC
Bilirubin Urine: NEGATIVE
Hgb urine dipstick: NEGATIVE
Ketones, ur: NEGATIVE
Nitrite: NEGATIVE
Specific Gravity, Urine: 1.013
pH: 7

## 2011-07-12 LAB — POCT I-STAT CREATININE: Creatinine, Ser: 0.5

## 2011-07-12 LAB — SAMPLE TO BLOOD BANK

## 2011-09-27 ENCOUNTER — Encounter: Payer: Self-pay | Admitting: Emergency Medicine

## 2011-09-27 ENCOUNTER — Emergency Department (HOSPITAL_COMMUNITY)
Admission: EM | Admit: 2011-09-27 | Discharge: 2011-09-27 | Disposition: A | Discharge status: Home / Self Care | Payer: Medicaid Other | Attending: Emergency Medicine | Admitting: Emergency Medicine

## 2011-09-27 DIAGNOSIS — Z8679 Personal history of other diseases of the circulatory system: Secondary | ICD-10-CM | POA: Insufficient documentation

## 2011-09-27 DIAGNOSIS — D5700 Hb-SS disease with crisis, unspecified: Secondary | ICD-10-CM

## 2011-09-27 DIAGNOSIS — R109 Unspecified abdominal pain: Secondary | ICD-10-CM | POA: Insufficient documentation

## 2011-09-27 DIAGNOSIS — M79609 Pain in unspecified limb: Secondary | ICD-10-CM | POA: Insufficient documentation

## 2011-09-27 HISTORY — DX: Cerebral infarction, unspecified: I63.9

## 2011-09-27 HISTORY — DX: Sickle-cell disease without crisis: D57.1

## 2011-09-27 LAB — CBC
HCT: 19.7 % — ABNORMAL LOW (ref 39.0–52.0)
Hemoglobin: 7.3 g/dL — ABNORMAL LOW (ref 13.0–17.0)
MCH: 30.5 pg (ref 26.0–34.0)
MCHC: 37.1 g/dL — ABNORMAL HIGH (ref 30.0–36.0)
MCV: 82.4 fL (ref 78.0–100.0)
Platelets: 328 10*3/uL (ref 150–400)
RBC: 2.39 MIL/uL — ABNORMAL LOW (ref 4.22–5.81)
RDW: 25.2 % — ABNORMAL HIGH (ref 11.5–15.5)
WBC: 11.9 10*3/uL — ABNORMAL HIGH (ref 4.0–10.5)

## 2011-09-27 LAB — DIFFERENTIAL
Basophils Absolute: 0 10*3/uL (ref 0.0–0.1)
Basophils Relative: 0 % (ref 0–1)
Eosinophils Absolute: 0.5 K/uL (ref 0.0–0.7)
Eosinophils Relative: 4 % (ref 0–5)
Lymphocytes Relative: 23 % (ref 12–46)
Lymphs Abs: 2.7 K/uL (ref 0.7–4.0)
Monocytes Absolute: 1.1 K/uL — ABNORMAL HIGH (ref 0.1–1.0)
Monocytes Relative: 9 % (ref 3–12)
Neutro Abs: 7.6 K/uL (ref 1.7–7.7)
Neutrophils Relative %: 64 % (ref 43–77)

## 2011-09-27 LAB — BASIC METABOLIC PANEL WITH GFR
CO2: 24 meq/L (ref 19–32)
Calcium: 9.5 mg/dL (ref 8.4–10.5)
Creatinine, Ser: 0.73 mg/dL (ref 0.50–1.35)
Glucose, Bld: 90 mg/dL (ref 70–99)

## 2011-09-27 LAB — RETICULOCYTES
RBC.: 2.39 MIL/uL — ABNORMAL LOW (ref 4.22–5.81)
Retic Count, Absolute: 435 10*3/uL — ABNORMAL HIGH (ref 19.0–186.0)
Retic Ct Pct: 18.2 % — ABNORMAL HIGH (ref 0.4–3.1)

## 2011-09-27 LAB — BASIC METABOLIC PANEL
BUN: 9 mg/dL (ref 6–23)
Chloride: 104 mEq/L (ref 96–112)
GFR calc Af Amer: >90 mL/min (ref >90)
GFR calc non Af Amer: >90 mL/min (ref >90)
Potassium: 4.3 mEq/L (ref 3.5–5.1)
Sodium: 139 mEq/L (ref 135–145)

## 2011-09-27 MED ORDER — KETOROLAC TROMETHAMINE 15 MG/ML IJ SOLN
15.0000 mg | INTRAMUSCULAR | Status: AC
  Administered 2011-09-27: 15 mg via INTRAVENOUS
  Filled 2011-09-27: qty 1

## 2011-09-27 MED ORDER — SODIUM CHLORIDE 0.9 % IV BOLUS (SEPSIS)
1000.0000 mL | Freq: Once | INTRAVENOUS | Status: AC
  Administered 2011-09-27: 1000 mL via INTRAVENOUS

## 2011-09-27 MED ORDER — KETOROLAC TROMETHAMINE 30 MG/ML IJ SOLN
INTRAMUSCULAR | Status: AC
  Filled 2011-09-27: qty 1

## 2011-09-27 MED ORDER — HYDROMORPHONE HCL PF 1 MG/ML IJ SOLN
1.0000 mg | Freq: Once | INTRAMUSCULAR | Status: AC
  Administered 2011-09-27: 1 mg via INTRAVENOUS
  Filled 2011-09-27: qty 1

## 2011-09-27 MED ORDER — ONDANSETRON HCL 4 MG/2ML IJ SOLN
4.0000 mg | Freq: Once | INTRAMUSCULAR | Status: AC
  Administered 2011-09-27: 4 mg via INTRAMUSCULAR
  Filled 2011-09-27: qty 2

## 2011-09-27 MED ORDER — FOLIC ACID 1 MG PO TABS
1.0000 mg | ORAL_TABLET | ORAL | Status: AC
  Administered 2011-09-27: 1 mg via ORAL
  Filled 2011-09-27: qty 1

## 2011-09-27 MED ORDER — OXYCODONE-ACETAMINOPHEN 5-325 MG PO TABS: ORAL_TABLET | ORAL | Status: DC

## 2011-09-27 NOTE — ED Provider Notes (Signed)
History     CSN: [id number]  Arrival date & time 09/27/11  4098   First MD Initiated Contact with Patient 09/27/11 [phone]      Chief Complaint  Patient presents with  . Sickle Cell Pain Crisis    (Consider location/radiation/quality/duration/timing/severity/associated sxs/prior treatment) HPI Comments: The patient reports woke up this AM due to pain in both sides and flanks with pain going down into his legs.  No weakness or numbness.  No CP, fevers, sore throat, cough.  No HA.  No SOB or N/V.  Pt normally takes hydrocodone or oxycodone for prn pain, has run out of both.  Took naprosyn this AM with no sig relief so came to the ED.  Pt has h/o stroke at age 69, but fully recovered per his history.  Dr. Concepcion Elk is his PCP.  He reports his baseline Hgb is around 7-8.  He reports cold weather sometimes can cause sickle crisis.    The history is provided by the patient.    Past Medical History  Diagnosis Date  . Sickle cell disease, type SS   . Stroke     Past Surgical History  Procedure Date  . Cholecystectomy     No family history on file.  History  Substance Use Topics  . Smoking status: Not on file  . Smokeless tobacco: Not on file  . Alcohol Use:       Review of Systems  Constitutional: Negative for fever and chills.  HENT: Negative for congestion, sore throat and rhinorrhea.   Respiratory: Negative for cough.   Cardiovascular: Negative for chest pain.  Musculoskeletal: Positive for arthralgias. Negative for joint swelling.  Neurological: Negative for weakness, numbness and headaches.  All other systems reviewed and are negative.    Allergies  Codeine  Home Medications   Current Outpatient Rx  Name Route Sig Dispense Refill  . HYDROCODONE-ACETAMINOPHEN 5-325 MG PO TABS Oral Take 1-2 tablets by mouth every 6 (six) hours as needed. For pain.     Marland Kitchen HYDROXYUREA 500 MG PO CAPS Oral Take 1,000-1,500 mg by mouth daily. Alternates between 2 and 3 tablets each day.     Marland Kitchen NAPROXEN 500 MG PO TABS Oral Take 500 mg by mouth 2 (two) times daily as needed. For pain.     . OXYCODONE-ACETAMINOPHEN 5-325 MG PO TABS Oral Take 1-2 tablets by mouth every 6 (six) hours as needed. For pain.     . OXYCODONE-ACETAMINOPHEN 5-325 MG PO TABS  1-2 tablets po q 4 hours as needed for moderate or severe pain 20 tablet 0    BP 107/54  Pulse 68  Temp(Src) 98.5 F (36.9 C) (Oral)  Resp 16  Wt 130 lb (58.968 kg)  SpO2 100%  Physical Exam  Nursing note and vitals reviewed. Constitutional: He appears well-developed and well-nourished.  HENT:  Head: Normocephalic.  Eyes: EOM are normal. Pupils are equal, round, and reactive to light. Scleral icterus is present.  Neck: Normal range of motion. Neck supple.  Cardiovascular: Normal rate.   Pulmonary/Chest: Effort normal.  Abdominal: Soft. Bowel sounds are normal. There is no tenderness.  Musculoskeletal: Normal range of motion. He exhibits tenderness. He exhibits no edema.  Neurological: He is alert.  Skin: Skin is warm and dry.  Psychiatric: He has a normal mood and affect.    ED Course  Procedures (including critical care time)  Labs Reviewed  CBC - Abnormal; Notable for the following:    WBC 11.9 (*)    RBC 2.39 (*)  Hemoglobin 7.3 (*) REPEATED TO VERIFY   HCT 19.7 (*)    MCHC 37.1 (*)    RDW 25.2 (*)    All other components within normal limits  DIFFERENTIAL - Abnormal; Notable for the following:    Monocytes Absolute 1.1 (*)    All other components within normal limits  RETICULOCYTES - Abnormal; Notable for the following:    Retic Ct Pct 18.2 (*) RESULTS CONFIRMED BY MANUAL DILUTION   RBC. 2.39 (*)    Retic Count, Manual 435.0 (*)    All other components within normal limits  BASIC METABOLIC PANEL  URINALYSIS, ROUTINE W REFLEX MICROSCOPIC   No results found.   1. Sickle cell crisis       MDM  RA sat is 92% which I think is normal for him due to anemia and sickle cell.  He has no cough or CP, no  h/o acute chest syndrome.  This is typical for his crises.  Hgb is near baseline by his report at 7.2.  retic count is up, so likely some sickling is taking place, IVF's, IV analgesics given and will continue to monitor.  If pain improves, will transition to PO's and likely be able to discharge with prescriptions and to then follow up with Dr. Concepcion Elk next week.        9:52 AM Pt reports feeling improved, wants to go home.  Will d/c home.  Gavin Pound. Oletta Lamas, MD 09/27/11 [phone]

## 2011-09-27 NOTE — Discharge Instructions (Signed)
 Sickle Cell Pain Crisis Sickle cell anemia requires regular medical attention by your healthcare provider and awareness about when to seek medical care. Pain is a common problem in children with sickle cell disease. This usually starts at less than 23 year of age. Pain can occur nearly anywhere in the body but most commonly occurs in the extremities, back, chest, or belly (abdomen). Pain episodes can start suddenly or may follow an illness. These attacks can appear as decreased activity, loss of appetite, change in behavior, or simply complaints of pain. DIAGNOSIS   Specialized blood and gene testing can help make this diagnosis early in the disease. Blood tests may then be done to watch blood levels.   Specialized brain scans are done when there are problems in the brain during a crisis.   Lung testing may be done later in the disease.  HOME CARE INSTRUCTIONS   Maintain good hydration. Increase you or your child's fluid intake in hot weather and during exercise.   Avoid smoking. Smoking lowers the oxygen in the blood and can cause the production of sickle-shaped cells (sickling).   Control pain. Only take over-the-counter or prescription medicines for pain, discomfort, or fever as directed by your caregiver. Do not give aspirin to children because of the association with Reye's syndrome.   Keep regular health care checks to keep a proper red blood cell (hemoglobin) level. A moderate anemia level protects against sickling crises.   You and your child should receive all the same immunizations and care as the people around you.   Mothers should breastfeed their babies if possible. Use formulas with iron added if breastfeeding is not possible. Additional iron should not be given unless there is a lack of it. People with sickle cell disease (SCD) build up iron faster than normal. Give folic acid  and additional vitamins as directed.   If you or your child has been prescribed antibiotics or other  medications to prevent problems, take them as directed.   Summer camps are available for children with SCD. They may help young people deal with their disease. The camps introduce them to other children with the same problem.   Young people with SCD may become frustrated or angry at their disease. This can cause rebellion and refusal to follow medical care. Help groups or counseling may help with these problems.   Wear a medical alert bracelet. When traveling, keep medical information, caregiver's names, and the medications you or your child takes with you at all times.  SEEK IMMEDIATE MEDICAL CARE IF:   You or your child develops dizziness or fainting, numbness in or difficulty with movement of arms and legs, difficulty with speech, or is acting abnormally. This could be early signs of a stroke. Immediate treatment is necessary.   You or your child has an oral temperature above 102 F (38.9 C), not controlled by medicine.   You or your child has other signs of infection (chills, lethargy, irritability, poor eating, vomiting). The younger the child, the more you should be concerned.   With fevers, do not give medicine to lower the fever right away. This could cover up a problem that is developing. Notify your caregiver.   You or your child develops pain that is not helped with medicine.   You or your child develops shortness of breath or is coughing up pus-like or bloody sputum.   You or your child develops any problems that are new and are causing you to worry.   You or  your child develops a persistent, often uncomfortable and painful penile erection. This is called priapism. Always check young boys for this. It is often embarrassing for them and they may not bring it to your attention. This is a medical emergency and needs immediate treatment. If this is not treated it will lead to impotence.   You or your child develops a new onset of abdominal pain, especially on the left side near the  stomach area.   You or your child has any questions or has problems that are not getting better. Return immediately if you feel your child is getting worse, even if your child was seen only a short while ago.  Document Released: 06/27/2005 Document Revised: 05/30/2011 Document Reviewed: 11/16/2009 [facility] Patient Information 2012 [place], [place].    Narcotic and benzodiazepine use may cause drowsiness, slowed breathing or dependence.  Please use with caution and do not drive, operate machinery or watch young children alone while taking them.  Taking combinations of these medications or drinking alcohol will potentiate these effects.

## 2011-09-27 NOTE — ED Notes (Signed)
Pt woken out of sleep with pain to bilateral lower extremities, lower back, bilateral flank. Took naproxen @ 0600 with no relief and came to ER for pain control.

## 2011-10-02 HISTORY — PX: RENAL BIOPSY: SHX156

## 2011-10-27 ENCOUNTER — Emergency Department (HOSPITAL_COMMUNITY)
Admission: EM | Admit: 2011-10-27 | Discharge: 2011-10-27 | Disposition: A | Discharge status: Home / Self Care | Payer: Medicaid Other | Attending: Emergency Medicine | Admitting: Emergency Medicine

## 2011-10-27 ENCOUNTER — Encounter (HOSPITAL_COMMUNITY): Payer: Self-pay | Admitting: Emergency Medicine

## 2011-10-27 DIAGNOSIS — Z79899 Other long term (current) drug therapy: Secondary | ICD-10-CM | POA: Insufficient documentation

## 2011-10-27 DIAGNOSIS — Z8673 Personal history of transient ischemic attack (TIA), and cerebral infarction without residual deficits: Secondary | ICD-10-CM | POA: Insufficient documentation

## 2011-10-27 DIAGNOSIS — T699XXA Effect of reduced temperature, unspecified, initial encounter: Secondary | ICD-10-CM

## 2011-10-27 DIAGNOSIS — T3390XA Superficial frostbite of unspecified sites, initial encounter: Secondary | ICD-10-CM

## 2011-10-27 DIAGNOSIS — M79673 Pain in unspecified foot: Secondary | ICD-10-CM

## 2011-10-27 DIAGNOSIS — D571 Sickle-cell disease without crisis: Secondary | ICD-10-CM | POA: Insufficient documentation

## 2011-10-27 DIAGNOSIS — M79609 Pain in unspecified limb: Secondary | ICD-10-CM | POA: Insufficient documentation

## 2011-10-27 DIAGNOSIS — X31XXXA Exposure to excessive natural cold, initial encounter: Secondary | ICD-10-CM | POA: Insufficient documentation

## 2011-10-27 MED ORDER — IBUPROFEN 200 MG PO TABS
600.0000 mg | ORAL_TABLET | Freq: Once | ORAL | Status: AC
  Administered 2011-10-27: 600 mg via ORAL
  Filled 2011-10-27: qty 3

## 2011-10-27 MED ORDER — HYDROCODONE-ACETAMINOPHEN 5-325 MG PO TABS
2.0000 | ORAL_TABLET | Freq: Once | ORAL | Status: AC
  Administered 2011-10-27: 2 via ORAL
  Filled 2011-10-27: qty 2

## 2011-10-27 NOTE — ED Provider Notes (Signed)
History     CSN: [id number]  Arrival date & time 10/27/11  1202   First MD Initiated Contact with Patient 10/27/11 1227      Chief Complaint  Patient presents with  . Foot Pain    (Consider location/radiation/quality/duration/timing/severity/associated sxs/prior treatment) Patient is a 26 y.o. male presenting with lower extremity pain. The history is provided by the patient.  Foot Pain Pertinent negatives include no chest pain, no abdominal pain, no headaches and no shortness of breath.  pt c/o left foot pain since last night. States was working outside and foot was exposed to wet/cold for several hours. Says milder pain in right foot. Foot pain is diffuse, the entire foot to ankle. No focal or point pain or tenderness. No skin changes or erythema. No numbness/weakness. No swelling. No redness. Constant dull pain without exacerbating or alleviating factors. No calf or lower leg pain or swelling. No hx same pain.  No fever.   Past Medical History  Diagnosis Date  . Sickle cell disease, type SS   . Stroke     Past Surgical History  Procedure Date  . Cholecystectomy     No family history on file.  History  Substance Use Topics  . Smoking status: Not on file  . Smokeless tobacco: Not on file  . Alcohol Use: 6.0 oz/week    10 Shots of liquor per week      Review of Systems  Constitutional: Negative for fever and chills.  Respiratory: Negative for shortness of breath.   Cardiovascular: Negative for chest pain and leg swelling.  Gastrointestinal: Negative for abdominal pain.  Genitourinary: Negative for flank pain.  Musculoskeletal: Negative for back pain and joint swelling.  Skin: Negative for rash and wound.  Neurological: Negative for weakness, numbness and headaches.  Hematological: Does not bruise/bleed easily.  Psychiatric/Behavioral: Negative for confusion.    Allergies  Codeine  Home Medications   Current Outpatient Rx  Name Route Sig Dispense Refill  .  HYDROCODONE-ACETAMINOPHEN 5-325 MG PO TABS Oral Take 1-2 tablets by mouth every 6 (six) hours as needed. For pain.     Marland Kitchen HYDROXYUREA 500 MG PO CAPS Oral Take 1,000-1,500 mg by mouth daily. Alternates between 2 and 3 tablets each day.    Marland Kitchen NAPROXEN 500 MG PO TABS Oral Take 500 mg by mouth 2 (two) times daily as needed. For pain.     . OXYCODONE-ACETAMINOPHEN 5-325 MG PO TABS Oral Take 1-2 tablets by mouth every 6 (six) hours as needed. For pain.     . OXYCODONE-ACETAMINOPHEN 5-325 MG PO TABS  1-2 tablets po q 4 hours as needed for moderate or severe pain 20 tablet 0    BP 114/55  Pulse 84  Temp(Src) 98.1 F (36.7 C) (Oral)  Resp 17  SpO2 94%  Physical Exam  Nursing note and vitals reviewed. Constitutional: He is oriented to person, place, and time. He appears well-developed and well-nourished. No distress.  HENT:  Head: Atraumatic.  Neck: Neck supple. No tracheal deviation present.  Cardiovascular: Normal rate.   Pulmonary/Chest: Effort normal. No accessory muscle usage. No respiratory distress.  Abdominal: He exhibits no distension.  Musculoskeletal: Normal range of motion. He exhibits no edema and no tenderness.       bil feet appear normal. No sts. No skin changes or erythema. Distal pulses palp. Normal cap refill distally. No focal bony tenderness.   Neurological: He is alert and oriented to person, place, and time.       Motor  intact. Able to flex/extend at ankle without pain, able to move all digits.   Skin: Skin is warm and dry.  Psychiatric: He has a normal mood and affect.    ED Course  Procedures (including critical care time)     MDM  Pt says has ride, did nnot drive. Hydrocodone po. Motrin po. Foot appears normal with good pulses. Pt says unlike symptoms related to sickle cell in past, does not feel is having sickle cell pain/crisis. Discussed possible neuropathic type pain related to cold/wet exposure, frostnip, etc.         Suzi Roots, MD 10/27/11  1319

## 2011-10-27 NOTE — ED Notes (Signed)
Pt. Stated, i was working yesterday and i was working outside and my feet got wet and ever since they have been very painful.

## 2011-10-27 NOTE — ED Notes (Signed)
Patient states that he was working outside in the cold and his left foot got wet and cold. Since then he states that he has been having severe stabbing like pain in his left foot. No obvious injury noted. Pedals present.

## 2011-10-28 ENCOUNTER — Encounter (HOSPITAL_COMMUNITY): Payer: Self-pay | Admitting: Emergency Medicine

## 2011-10-28 ENCOUNTER — Observation Stay (HOSPITAL_COMMUNITY)
Admission: EM | Admit: 2011-10-28 | Discharge: 2011-10-28 | Disposition: A | Discharge status: Home / Self Care | Payer: Medicaid Other | Source: Ambulatory Visit | Attending: Emergency Medicine | Admitting: Emergency Medicine

## 2011-10-28 DIAGNOSIS — M79609 Pain in unspecified limb: Principal | ICD-10-CM | POA: Insufficient documentation

## 2011-10-28 DIAGNOSIS — M79673 Pain in unspecified foot: Secondary | ICD-10-CM

## 2011-10-28 DIAGNOSIS — Z8673 Personal history of transient ischemic attack (TIA), and cerebral infarction without residual deficits: Secondary | ICD-10-CM | POA: Insufficient documentation

## 2011-10-28 DIAGNOSIS — J45909 Unspecified asthma, uncomplicated: Secondary | ICD-10-CM | POA: Insufficient documentation

## 2011-10-28 DIAGNOSIS — D571 Sickle-cell disease without crisis: Secondary | ICD-10-CM | POA: Insufficient documentation

## 2011-10-28 LAB — RETICULOCYTES: RBC.: 2.22 MIL/uL — ABNORMAL LOW (ref 4.22–5.81)

## 2011-10-28 LAB — POCT I-STAT, CHEM 8
BUN: 8 mg/dL (ref 6–23)
Calcium, Ion: 1.16 mmol/L (ref 1.12–1.32)
Chloride: 107 mEq/L (ref 96–112)
Glucose, Bld: 103 mg/dL — ABNORMAL HIGH (ref 70–99)
HCT: 22 % — ABNORMAL LOW (ref 39.0–52.0)
TCO2: 24 mmol/L (ref 0–100)

## 2011-10-28 LAB — CBC
HCT: 18.4 % — ABNORMAL LOW (ref 39.0–52.0)
MCHC: 38 g/dL — ABNORMAL HIGH (ref 30.0–36.0)
Platelets: 328 10*3/uL (ref 150–400)
RDW: 24 % — ABNORMAL HIGH (ref 11.5–15.5)

## 2011-10-28 MED ORDER — SODIUM CHLORIDE 0.9 % IV BOLUS (SEPSIS)
1000.0000 mL | Freq: Once | INTRAVENOUS | Status: AC
  Administered 2011-10-28: 1000 mL via INTRAVENOUS

## 2011-10-28 MED ORDER — OXYCODONE-ACETAMINOPHEN 5-325 MG PO TABS
1.0000 | ORAL_TABLET | ORAL | Status: DC | PRN
  Administered 2011-10-28: 1 via ORAL
  Filled 2011-10-28: qty 1

## 2011-10-28 MED ORDER — KETOROLAC TROMETHAMINE 30 MG/ML IJ SOLN
30.0000 mg | Freq: Once | INTRAMUSCULAR | Status: AC
  Administered 2011-10-28: 30 mg via INTRAVENOUS
  Filled 2011-10-28: qty 1

## 2011-10-28 MED ORDER — HYDROMORPHONE HCL PF 1 MG/ML IJ SOLN
1.0000 mg | Freq: Once | INTRAMUSCULAR | Status: AC
  Administered 2011-10-28: 1 mg via INTRAVENOUS

## 2011-10-28 MED ORDER — OXYCODONE-ACETAMINOPHEN 5-325 MG PO TABS: 1.0000 | ORAL_TABLET | Freq: Four times a day (QID) | ORAL | Status: DC | PRN

## 2011-10-28 MED ORDER — HYDROMORPHONE HCL PF 1 MG/ML IJ SOLN
1.0000 mg | INTRAMUSCULAR | Status: DC | PRN
Start: 2011-10-28 — End: 2011-10-28
  Administered 2011-10-28: 1 mg via INTRAVENOUS
  Filled 2011-10-28 (×2): qty 1

## 2011-10-28 MED ORDER — HYDROMORPHONE HCL PF 1 MG/ML IJ SOLN
1.0000 mg | Freq: Once | INTRAMUSCULAR | Status: AC
  Administered 2011-10-28: 1 mg via INTRAVENOUS
  Filled 2011-10-28: qty 1

## 2011-10-28 MED ORDER — SODIUM CHLORIDE 0.9 % IV SOLN
INTRAVENOUS | Status: DC
  Administered 2011-10-28: 09:00:00 via INTRAVENOUS

## 2011-10-28 MED ORDER — ONDANSETRON HCL 4 MG/2ML IJ SOLN: 4.0000 mg | Freq: Four times a day (QID) | INTRAMUSCULAR | Status: DC | PRN

## 2011-10-28 MED ORDER — ONDANSETRON HCL 4 MG/2ML IJ SOLN
4.0000 mg | Freq: Once | INTRAMUSCULAR | Status: AC
  Administered 2011-10-28: 4 mg via INTRAVENOUS
  Filled 2011-10-28: qty 2

## 2011-10-28 MED ORDER — MORPHINE SULFATE 4 MG/ML IJ SOLN
4.0000 mg | Freq: Once | INTRAMUSCULAR | Status: AC
  Administered 2011-10-28: 4 mg via INTRAVENOUS
  Filled 2011-10-28: qty 1

## 2011-10-28 MED ORDER — OXYCODONE-ACETAMINOPHEN 5-325 MG PO TABS
2.0000 | ORAL_TABLET | Freq: Once | ORAL | Status: AC
  Administered 2011-10-28: 2 via ORAL
  Filled 2011-10-28: qty 2

## 2011-10-28 NOTE — ED Notes (Signed)
Pt c/o increased pain. States needs to stay longer. Pt can not find transportation. Bus passes given. Provider made aware and will re-assess.

## 2011-10-28 NOTE — ED Provider Notes (Signed)
History     CSN: [id number]  Arrival date & time 10/28/11  4098   First MD Initiated Contact with Patient 10/28/11 [phone]      Chief Complaint  Patient presents with  . Foot Pain    (Consider location/radiation/quality/duration/timing/severity/associated sxs/prior treatment) The history is provided by the patient.    Past Medical History  Diagnosis Date  . Sickle cell disease, type SS   . Stroke     Past Surgical History  Procedure Date  . Cholecystectomy     No family history on file.  History  Substance Use Topics  . Smoking status: Not on file  . Smokeless tobacco: Not on file  . Alcohol Use: 6.0 oz/week    10 Shots of liquor per week      Review of Systems  Allergies  Codeine  Home Medications   Current Outpatient Rx  Name Route Sig Dispense Refill  . HYDROXYUREA 500 MG PO CAPS Oral Take 1,000-1,500 mg by mouth daily. Alternates between 2 and 3 tablets each day.    . OXYCODONE-ACETAMINOPHEN 5-325 MG PO TABS  1-2 tablets po q 4 hours as needed for moderate or severe pain 20 tablet 0    BP 118/64  Pulse 101  Temp(Src) 98.9 F (37.2 C) (Oral)  Resp 16  SpO2 99%  Physical Exam  ED Course  Procedures (including critical care time)  Labs Reviewed  CBC - Abnormal; Notable for the following:    WBC 10.7 (*) WHITE COUNT CONFIRMED ON SMEAR   RBC 2.22 (*)    Hemoglobin 7.0 (*) REPEATED TO VERIFY   HCT 18.4 (*)    MCHC 38.0 (*) SICKLE CELLS   RDW 24.0 (*)    All other components within normal limits  RETICULOCYTES - Abnormal; Notable for the following:    Retic Ct Pct 17.6 (*) RESULTS CONFIRMED BY MANUAL DILUTION   RBC. 2.22 (*)    Retic Count, Manual 390.7 (*)    All other components within normal limits  POCT I-STAT, CHEM 8 - Abnormal; Notable for the following:    Glucose, Bld 103 (*)    Hemoglobin 7.5 (*)    HCT 22.0 (*)    All other components within normal limits  I-STAT, CHEM 8   No results found.   1. Foot pain   2. Sickle cell  disease     6:20 AM Handoff from [facility] NP at 0600. History of Hgb SS disease -- emergency department with left foot pain. Pain is assumed to be from vaso-occlusive crisis. Patient takes Percocet at home for pain however is now. Plan: Hydrate, several doses of IV pain medicines, d/c to home.   6:46 AM Patient c/o 4/10 pain down from 10/10. Review of previous labs shows persistent icterus. Total bili typically in 10-14 range. Exam:  Gen NAD; Eyes Icteric Heart RRR, nml S1,S2, no m/r/g; Lungs CTAB; Abd soft, NT, no rebound or guarding; Ext 2+ pedal pulses bilaterally, no edema, sensation intact to light touch distally, full ROM.  7:52 AM Patient has received 2 percocet. His pain is controlled. Will d/c to home. Urged to return with worsening pain, fever, SOB. Urged to see PCP in next 3 days. He verbalizes understanding and agrees with plan.    7:54 AM Patient counseled on use of narcotic pain medications. Counseled not to combine these medications with others containing tylenol. Urged not to drink alcohol, drive, or perform any other activities that requires focus while taking these medications. The patient verbalizes  understanding and agrees with the plan.  9:13 AM Patient was discharged however started to have severe pain and did not leave. He was re-evaluated. Continued L foot pain. Percocet is not helping. Will place in CDU obs.    MDM  Foot pain, likely 2/2 vasoocclusive crisis -- chronic icterus. Pain controlled in ED. Patient appears well and stable for d/c home.  Medical screening examination/treatment/procedure(s) were performed by non-physician practitioner and as supervising physician I was immediately available for consultation/collaboration.       Carolee Rota, PA 10/28/11 0755  Carolee Rota, PA 10/28/11 1610  Sunnie Nielsen, MD 10/28/11 [phone]

## 2011-10-28 NOTE — ED Notes (Signed)
Water given as requested.  Pt states ate breakfast.

## 2011-10-28 NOTE — ED Provider Notes (Signed)
Medical screening examination/treatment/procedure(s) were conducted as a shared visit with non-physician practitioner(s) and myself.  I personally evaluated the patient during the encounter   Nat Christen, MD 10/28/11 1447

## 2011-10-28 NOTE — ED Notes (Signed)
Pt attempting to call for ride

## 2011-10-28 NOTE — ED Notes (Signed)
PT. REPORTS PERSISTENT LEFT FOOT PAIN ONSET YESTERDAY , DENIES INJURY OR FALL .

## 2011-10-28 NOTE — ED Provider Notes (Signed)
Medical screening examination/treatment/procedure(s) were conducted as a shared visit with non-physician practitioner(s) and myself.  I personally evaluated the patient during the encounter   Patient has been seen and evaluated and has left foot pain that is concerning for possible persistent sickle cell crisis in his foot.  There are no signs of acute infection as there is no fevers, no erythema, no crepitus.  Patient does have palpable DP and PT pulses.  Cap refill is less than 2 seconds.  Patient's sensation is intact as well.  Patient's hemoglobin is stable for him and his reticulocyte count is consistent with a sickle cell crisis given its elevation.  Patient's pain is starting to improve but wouldn't continue to need further observation so the patient has been placed in a CDU sickle cell crisis protocol.  If patient is not improving he will require admission for further evaluation of this.  Nat Christen, MD 10/28/11 1321

## 2011-10-28 NOTE — ED Provider Notes (Signed)
2:29 PM Patient reports pain is a 2/10. Discharge home. Patient likely had sickle cell exacerbation. Advised followup with primary care physician for further evaluation if needed. Left foot exam  was 100% normal. Full range of motion, normal capillary refill, normal sensation, normal strength did not feel x-ray was necessary since recall injury or fall.  Results for orders placed during the hospital encounter of 10/28/11  CBC      Component Value Range   WBC 10.7 (*) 4.0 - 10.5 (K/uL)   RBC 2.22 (*) 4.22 - 5.81 (MIL/uL)   Hemoglobin 7.0 (*) 13.0 - 17.0 (g/dL)   HCT 16.1 (*) 09.6 - 52.0 (%)   MCV 82.9  78.0 - 100.0 (fL)   MCH 31.5  26.0 - 34.0 (pg)   MCHC 38.0 (*) 30.0 - 36.0 (g/dL)   RDW 04.5 (*) 40.9 - 15.5 (%)   Platelets 328  150 - 400 (K/uL)  RETICULOCYTES      Component Value Range   Retic Ct Pct 17.6 (*) 0.4 - 3.1 (%)   RBC. 2.22 (*) 4.22 - 5.81 (MIL/uL)   Retic Count, Manual 390.7 (*) 19.0 - 186.0 (K/uL)  POCT I-STAT, CHEM 8      Component Value Range   Sodium 142  135 - 145 (mEq/L)   Potassium 4.3  3.5 - 5.1 (mEq/L)   Chloride 107  96 - 112 (mEq/L)   BUN 8  6 - 23 (mg/dL)   Creatinine, Ser 8.11  0.50 - 1.35 (mg/dL)   Glucose, Bld 914 (*) 70 - 99 (mg/dL)   Calcium, Ion 7.82  9.56 - 1.32 (mmol/L)   TCO2 24  0 - 100 (mmol/L)   Hemoglobin 7.5 (*) 13.0 - 17.0 (g/dL)   HCT 21.3 (*) 08.6 - 52.0 (%)     Thomasene Lot, PA-C 10/28/11 1431

## 2011-10-28 NOTE — ED Provider Notes (Signed)
History     CSN: [id number]  Arrival date & time 10/28/11  4098   First MD Initiated Contact with Patient 10/28/11 [phone]      Chief Complaint  Patient presents with  . Foot Pain    (Consider location/radiation/quality/duration/timing/severity/associated sxs/prior treatment) HPI Comments: Seen here yesterday for left foot pain after becoming wet and cold outside.  He was seen and this was thought to be frostbite he is back tonight with increased pain, tingling.  He describes the pain as sharp, piercing stabs to the entire foot has no decreased mobility of the toes.  Ankle, no swelling, no erythema.  No discoloration, no swelling.  It is warm to touch, good cap refill, positive pulses  Patient is a 26 y.o. male presenting with lower extremity pain. The history is provided by the patient.  Foot Pain This is a recurrent problem. The current episode started yesterday. Pertinent negatives include no chills, fever, numbness or weakness.  Foot Pain This is a recurrent problem. The current episode started yesterday.    Past Medical History  Diagnosis Date  . Sickle cell disease, type SS   . Stroke   . Asthma     Past Surgical History  Procedure Date  . Cholecystectomy     Family History  Problem Relation Age of Onset  . Diabetes Sister     History  Substance Use Topics  . Smoking status: Never Smoker   . Smokeless tobacco: Not on file  . Alcohol Use: 6.0 oz/week    10 Shots of liquor per week      Review of Systems  Constitutional: Negative for fever and chills.  Cardiovascular: Negative for leg swelling.  Skin: Negative for color change, pallor and wound.  Neurological: Negative for weakness and numbness.    Allergies  Codeine  Home Medications   No current outpatient prescriptions on file.  BP 112/70  Pulse 74  Temp(Src) 98.1 F (36.7 C) (Oral)  Resp 18  SpO2 96%  Physical Exam  Constitutional: He is oriented to person, place, and time. He appears  well-developed and well-nourished.  HENT:  Head: Normocephalic.  Eyes: Pupils are equal, round, and reactive to light.  Neck: Normal range of motion.  Cardiovascular: Normal rate.   Pulmonary/Chest: Effort normal.  Musculoskeletal: Normal range of motion. He exhibits no edema and no tenderness.       Left ankle: He exhibits normal range of motion, no deformity and normal pulse.       Local area of tenderness, pain, no discoloration positive caprefill strong pulses  Neurological: He is alert and oriented to person, place, and time.  Skin: Skin is warm and dry.    ED Course  Procedures (including critical care time)  Labs Reviewed  CBC - Abnormal; Notable for the following:    WBC 10.7 (*) WHITE COUNT CONFIRMED ON SMEAR   RBC 2.22 (*)    Hemoglobin 7.0 (*) REPEATED TO VERIFY   HCT 18.4 (*)    MCHC 38.0 (*) SICKLE CELLS   RDW 24.0 (*)    All other components within normal limits  RETICULOCYTES - Abnormal; Notable for the following:    Retic Ct Pct 17.6 (*) RESULTS CONFIRMED BY MANUAL DILUTION   RBC. 2.22 (*)    Retic Count, Manual 390.7 (*)    All other components within normal limits  POCT I-STAT, CHEM 8 - Abnormal; Notable for the following:    Glucose, Bld 103 (*)    Hemoglobin 7.5 (*)  HCT 22.0 (*)    All other components within normal limits  LAB REPORT - SCANNED   Dg Chest 2 View  10/30/2011  *RADIOLOGY REPORT*  Clinical Data: Shortness of breath, smoker, sickle cell disease  CHEST - 2 VIEW  Comparison: 05/16/2011  Findings: Upper-normal size of cardiac silhouette. Mediastinal contours and pulmonary vascularity normal. Lungs clear. No pleural effusion or pneumothorax. No acute osseous findings.  IMPRESSION: No acute abnormalities.  Original Report Authenticated By: Lollie Marrow, M.D.   Dg Ankle Complete Left  10/30/2011  *RADIOLOGY REPORT*  Clinical Data: Sickle cell disease, left ankle pain for 5 days, no known injury  LEFT ANKLE COMPLETE - 3+ VIEW  Comparison: None   Findings: Ankle mortise intact. Osseous mineralization normal. No acute fracture, dislocation, or bone destruction  IMPRESSION: No acute osseous abnormalities.  Original Report Authenticated By: Lollie Marrow, M.D.   Mr Foot Left Wo Contrast  10/31/2011  *RADIOLOGY REPORT*  Clinical Data: Pain.  Sickle cell disease.  Question avascular necrosis.  MRI OF THE LEFT FOREFOOT WITHOUT CONTRAST  Technique:  Multiplanar, multisequence MR imaging was performed. No intravenous contrast was administered.  Comparison: None.  Findings: There are scattered areas of marrow signal abnormality, most prominent in the proximal aspect of the first metatarsal. Additional areas of marrow signal abnormality is seen in the distal talus, navicular, cuboid, proximal second metatarsal and medial cuneiform.  No fracture is identified.  No fluid collection or mass is seen.  Intrinsic musculature appears normal.  IMPRESSION: Multi focal areas of marrow signal abnormality most prominent in the proximal first metatarsal.  Findings could be due to the presence of active marrow, infarct or possibly osteomyelitis.  Original Report Authenticated By: Bernadene Bell. Maricela Curet, M.D.   Mr Ankle Left  Wo Contrast  10/31/2011  *RADIOLOGY REPORT*  Clinical Data: Severe pain.  History of sickle cell disease.  MRI OF THE LEFT ANKLE WITHOUT CONTRAST  Technique:  Multiplanar, multisequence MR imaging was performed. No intravenous contrast was administered.  Comparison: None.  Findings: There is multi focal decreased T1 marrow signal, particularly in the distal tibia.  The patient has an ankle joint effusion.  There is subcutaneous edema about the ankle with mild muscular edema also seen scattered in intrinsic musculature of the foot.  No focal fluid collection is identified.  Tendons appear intact.  No ligament tear is identified.  IMPRESSION: Multi focal marrow signal abnormality appearing most intense in the distal tibia is nonspecific.  Findings could be due  to bone infarcts, the presence of active marrow or possibly osteomyelitis.  Original Report Authenticated By: Bernadene Bell. D'ALESSIO, M.D.     1. Foot pain   2. Sickle cell disease       MDM  Will obtain labs including reticulocyte count to evaluate for sickle cell crisis  .  I have ordered IV hydration and pain control as well Patient's reticulocyte count is positive will continue hydration pain control.        Arman Filter, NP 10/28/11 0550  Arman Filter, NP 10/31/11 2000  Medical screening examination/treatment/procedure(s) were performed by non-physician practitioner and as supervising physician I was immediately available for consultation/collaboration.  Sunnie Nielsen, MD 11/04/11 [phone]

## 2011-10-30 ENCOUNTER — Emergency Department (HOSPITAL_COMMUNITY): Payer: Medicaid Other

## 2011-10-30 ENCOUNTER — Encounter (HOSPITAL_COMMUNITY): Payer: Self-pay | Admitting: *Deleted

## 2011-10-30 ENCOUNTER — Inpatient Hospital Stay (HOSPITAL_COMMUNITY)
Admission: EM | Admit: 2011-10-30 | Discharge: 2011-11-05 | DRG: 812 | Disposition: A | Discharge status: Home / Self Care | Payer: Medicaid Other | Source: Ambulatory Visit | Attending: Internal Medicine | Admitting: Internal Medicine

## 2011-10-30 DIAGNOSIS — N390 Urinary tract infection, site not specified: Secondary | ICD-10-CM | POA: Diagnosis present

## 2011-10-30 DIAGNOSIS — M879 Osteonecrosis, unspecified: Secondary | ICD-10-CM

## 2011-10-30 DIAGNOSIS — F1910 Other psychoactive substance abuse, uncomplicated: Secondary | ICD-10-CM | POA: Diagnosis present

## 2011-10-30 DIAGNOSIS — F17200 Nicotine dependence, unspecified, uncomplicated: Secondary | ICD-10-CM | POA: Diagnosis present

## 2011-10-30 DIAGNOSIS — R0902 Hypoxemia: Secondary | ICD-10-CM | POA: Diagnosis present

## 2011-10-30 DIAGNOSIS — D5700 Hb-SS disease with crisis, unspecified: Principal | ICD-10-CM | POA: Diagnosis present

## 2011-10-30 DIAGNOSIS — H5710 Ocular pain, unspecified eye: Secondary | ICD-10-CM | POA: Diagnosis present

## 2011-10-30 DIAGNOSIS — M79609 Pain in unspecified limb: Secondary | ICD-10-CM | POA: Diagnosis present

## 2011-10-30 DIAGNOSIS — D571 Sickle-cell disease without crisis: Secondary | ICD-10-CM | POA: Diagnosis present

## 2011-10-30 DIAGNOSIS — D72829 Elevated white blood cell count, unspecified: Secondary | ICD-10-CM | POA: Diagnosis present

## 2011-10-30 DIAGNOSIS — H109 Unspecified conjunctivitis: Secondary | ICD-10-CM | POA: Diagnosis present

## 2011-10-30 DIAGNOSIS — D638 Anemia in other chronic diseases classified elsewhere: Secondary | ICD-10-CM | POA: Diagnosis present

## 2011-10-30 DIAGNOSIS — M79672 Pain in left foot: Secondary | ICD-10-CM | POA: Diagnosis present

## 2011-10-30 DIAGNOSIS — M8700 Idiopathic aseptic necrosis of unspecified bone: Secondary | ICD-10-CM | POA: Diagnosis present

## 2011-10-30 DIAGNOSIS — Z8673 Personal history of transient ischemic attack (TIA), and cerebral infarction without residual deficits: Secondary | ICD-10-CM

## 2011-10-30 DIAGNOSIS — M25579 Pain in unspecified ankle and joints of unspecified foot: Secondary | ICD-10-CM | POA: Diagnosis present

## 2011-10-30 DIAGNOSIS — D649 Anemia, unspecified: Secondary | ICD-10-CM

## 2011-10-30 LAB — COMPREHENSIVE METABOLIC PANEL
ALT: 22 U/L (ref 0–53)
AST: 75 U/L — ABNORMAL HIGH (ref 0–37)
Albumin: 3.9 g/dL (ref 3.5–5.2)
CO2: 25 mEq/L (ref 19–32)
Calcium: 9.3 mg/dL (ref 8.4–10.5)
Sodium: 135 mEq/L (ref 135–145)
Total Protein: 7.7 g/dL (ref 6.0–8.3)

## 2011-10-30 LAB — DIFFERENTIAL
Basophils Absolute: 0 10*3/uL (ref 0.0–0.1)
Eosinophils Absolute: 0.4 10*3/uL (ref 0.0–0.7)
Eosinophils Relative: 3 % (ref 0–5)
Monocytes Absolute: 1.5 10*3/uL — ABNORMAL HIGH (ref 0.1–1.0)
Neutrophils Relative %: 76 % (ref 43–77)

## 2011-10-30 LAB — CBC
MCH: 32.6 pg (ref 26.0–34.0)
Platelets: 313 10*3/uL (ref 150–400)
RBC: 2.18 MIL/uL — ABNORMAL LOW (ref 4.22–5.81)

## 2011-10-30 LAB — RETICULOCYTES: Retic Count, Absolute: 660.5 10*3/uL — ABNORMAL HIGH (ref 19.0–186.0)

## 2011-10-30 MED ORDER — ACETAMINOPHEN 650 MG RE SUPP: 650.0000 mg | Freq: Four times a day (QID) | RECTAL | Status: DC | PRN

## 2011-10-30 MED ORDER — HYDROMORPHONE HCL PF 1 MG/ML IJ SOLN
1.0000 mg | Freq: Once | INTRAMUSCULAR | Status: AC
  Administered 2011-10-30: 1 mg via INTRAVENOUS
  Filled 2011-10-30: qty 1

## 2011-10-30 MED ORDER — THIAMINE HCL 100 MG/ML IJ SOLN
100.0000 mg | Freq: Every day | INTRAMUSCULAR | Status: DC
  Administered 2011-10-31: 100 mg via INTRAVENOUS
  Filled 2011-10-30 (×2): qty 2

## 2011-10-30 MED ORDER — SODIUM CHLORIDE 0.9 % IV SOLN
Freq: Once | INTRAVENOUS | Status: AC
  Administered 2011-10-30: 22:00:00 via INTRAVENOUS

## 2011-10-30 MED ORDER — HYDROMORPHONE HCL PF 1 MG/ML IJ SOLN
1.0000 mg | INTRAMUSCULAR | Status: DC | PRN
  Administered 2011-10-31 (×8): 1 mg via INTRAVENOUS
  Filled 2011-10-30 (×8): qty 1

## 2011-10-30 MED ORDER — HYDROCODONE-ACETAMINOPHEN 5-325 MG PO TABS
1.0000 | ORAL_TABLET | Freq: Four times a day (QID) | ORAL | Status: DC | PRN
  Administered 2011-11-01: 1 via ORAL
  Filled 2011-10-30: qty 1

## 2011-10-30 MED ORDER — VITAMIN B-1 100 MG PO TABS
100.0000 mg | ORAL_TABLET | Freq: Every day | ORAL | Status: DC
  Administered 2011-10-31 – 2011-11-05 (×6): 100 mg via ORAL
  Filled 2011-10-30 (×8): qty 1

## 2011-10-30 MED ORDER — LORAZEPAM 1 MG PO TABS: 1.0000 mg | ORAL_TABLET | Freq: Four times a day (QID) | ORAL | Status: AC | PRN

## 2011-10-30 MED ORDER — KETOROLAC TROMETHAMINE 30 MG/ML IJ SOLN
30.0000 mg | Freq: Once | INTRAMUSCULAR | Status: AC
  Administered 2011-10-30: 30 mg via INTRAVENOUS
  Filled 2011-10-30: qty 1

## 2011-10-30 MED ORDER — FOLIC ACID 1 MG PO TABS: 1.0000 mg | ORAL_TABLET | Freq: Every day | ORAL | Status: DC

## 2011-10-30 MED ORDER — ONDANSETRON HCL 4 MG PO TABS: 4.0000 mg | ORAL_TABLET | Freq: Four times a day (QID) | ORAL | Status: DC | PRN

## 2011-10-30 MED ORDER — LORAZEPAM 2 MG/ML IJ SOLN: 1.0000 mg | Freq: Four times a day (QID) | INTRAMUSCULAR | Status: AC | PRN

## 2011-10-30 MED ORDER — SODIUM CHLORIDE 0.9 % IV SOLN
INTRAVENOUS | Status: DC
  Administered 2011-10-31 – 2011-11-01 (×4): via INTRAVENOUS

## 2011-10-30 MED ORDER — ADULT MULTIVITAMIN W/MINERALS CH
1.0000 | ORAL_TABLET | Freq: Every day | ORAL | Status: DC
  Administered 2011-10-31 – 2011-11-05 (×7): 1 via ORAL
  Filled 2011-10-30 (×9): qty 1

## 2011-10-30 MED ORDER — ONDANSETRON HCL 4 MG/2ML IJ SOLN: 4.0000 mg | Freq: Four times a day (QID) | INTRAMUSCULAR | Status: DC | PRN

## 2011-10-30 MED ORDER — HYDROXYUREA 500 MG PO CAPS
1500.0000 mg | ORAL_CAPSULE | ORAL | Status: DC
  Administered 2011-10-31 – 2011-11-04 (×3): 1500 mg via ORAL
  Filled 2011-10-30 (×3): qty 3

## 2011-10-30 MED ORDER — FOLIC ACID 1 MG PO TABS
1.0000 mg | ORAL_TABLET | Freq: Every day | ORAL | Status: DC
  Filled 2011-10-30 (×2): qty 1

## 2011-10-30 MED ORDER — ACETAMINOPHEN 325 MG PO TABS: 650.0000 mg | ORAL_TABLET | Freq: Four times a day (QID) | ORAL | Status: DC | PRN

## 2011-10-30 NOTE — ED Notes (Signed)
The pt is c/o lt foot pain since Friday night.  He has been seen several times for the same

## 2011-10-30 NOTE — ED Notes (Signed)
Provided pt with beverage.

## 2011-10-30 NOTE — ED Provider Notes (Signed)
History     CSN: [id number]  Arrival date & time 10/30/11  4098   First MD Initiated Contact with Patient 10/30/11 2032      Chief Complaint  Patient presents with  . Foot Pain    (Consider location/radiation/quality/duration/timing/severity/associated sxs/prior treatment) Patient is a 26 y.o. male presenting with sickle cell pain. The history is provided by the patient and medical records.  Sickle Cell Pain Crisis  This is a recurrent problem. Episode onset: about 4 days ago. The problem occurs continuously. The problem has been gradually worsening. The pain is associated with cold exposure. Pain location: left ankle. Site of pain is localized in a joint. The pain is similar to prior episodes. The pain is moderate. Relieved by: improved with percocet and dilaudid. The symptoms are aggravated by activity and movement. Pertinent negatives include no chest pain, no blurred vision, no double vision, no photophobia, no abdominal pain, no constipation, no diarrhea, no nausea, no vomiting, no dysuria, no hematuria, no congestion, no ear pain, no headaches, no rhinorrhea, no sore throat, no back pain, no neck pain, no neck stiffness, no loss of sensation, no tingling, no weakness, no cough, no difficulty breathing, no rash, no eye pain and no eye redness. There is no swelling present. He has been behaving normally. He has been eating and drinking normally. He sickle cell type is SS. There is no history of acute chest syndrome. There is no history of stroke. He has not been treated with chronic transfusion therapy. He has been treated with hydroxyurea. There were no sick contacts. Recently, medical care has been given at this facility (seen recently for similar symptoms). Services received include medications given.    Past Medical History  Diagnosis Date  . Sickle cell disease, type SS   . Stroke     Past Surgical History  Procedure Date  . Cholecystectomy     History reviewed. No pertinent  family history.  History  Substance Use Topics  . Smoking status: Not on file  . Smokeless tobacco: Not on file  . Alcohol Use: 6.0 oz/week    10 Shots of liquor per week      Review of Systems  Constitutional: Negative for fever, chills, activity change, appetite change and fatigue.  HENT: Negative for ear pain, congestion, sore throat, rhinorrhea, neck pain and neck stiffness.   Eyes: Negative for blurred vision, double vision, photophobia, pain, redness and visual disturbance.  Respiratory: Negative for cough, shortness of breath and wheezing.   Cardiovascular: Negative for chest pain, palpitations and leg swelling.  Gastrointestinal: Negative for nausea, vomiting, abdominal pain, diarrhea, constipation and blood in stool.  Genitourinary: Negative for dysuria, urgency, hematuria, flank pain and penile pain.       No priapism.   Musculoskeletal: Negative for back pain.  Skin: Negative for rash and wound.  Neurological: Negative for dizziness, tingling, seizures, facial asymmetry, speech difficulty, weakness, light-headedness, numbness and headaches.  Psychiatric/Behavioral: Negative for confusion.  All other systems reviewed and are negative.    Allergies  Codeine  Home Medications   Current Outpatient Rx  Name Route Sig Dispense Refill  . FOLIC ACID 1 MG PO TABS Oral Take 1 mg by mouth daily.    Marland Kitchen HYDROCODONE-ACETAMINOPHEN 5-325 MG PO TABS Oral Take 1-2 tablets by mouth every 6 (six) hours as needed. For pain    . HYDROXYUREA 500 MG PO CAPS Oral Take 1,000-1,500 mg by mouth daily. Alternates between 2 and 3 tablets each day.    Marland Kitchen  OXYCODONE-ACETAMINOPHEN 5-325 MG PO TABS Oral Take 1-2 tablets by mouth every 6 (six) hours as needed. For pain      BP 115/64  Pulse 103  Temp 100.1 F (37.8 C)  Resp 20  SpO2 84%  Physical Exam  Nursing note and vitals reviewed. Constitutional: He is oriented to person, place, and time. He appears well-developed and well-nourished.   Non-toxic appearance. No distress.  HENT:  Head: Normocephalic and atraumatic.  Mouth/Throat: Oropharynx is clear and moist.  Eyes: Conjunctivae and EOM are normal. Pupils are equal, round, and reactive to light. Scleral icterus (moderate bilaterally) is present.  Neck: Normal range of motion. Neck supple. No JVD present.  Cardiovascular: Normal rate, regular rhythm, normal heart sounds and intact distal pulses.   No murmur heard. Pulmonary/Chest: Effort normal and breath sounds normal. No respiratory distress. He has no wheezes. He has no rales.  Abdominal: Soft. Bowel sounds are normal. He exhibits no distension. There is no tenderness. There is no rebound and no guarding.  Musculoskeletal: Normal range of motion.       Left hip: Normal.       Left knee: Normal.       Left ankle: He exhibits no swelling, no ecchymosis, no deformity and normal pulse. tenderness (tenderness medial and lateral ankle. ).       Left lower leg: Normal.       Pain diffusely around left ankle. No pain in foot or tibia or fibula. Pain worse with active ROM.   Neurological: He is alert and oriented to person, place, and time. He has normal strength. No cranial nerve deficit. GCS eye subscore is 4. GCS verbal subscore is 5. GCS motor subscore is 6.  Skin: Skin is warm and dry. No rash noted. He is not diaphoretic.  Psychiatric: He has a normal mood and affect.    ED Course  Procedures (including critical care time)  Labs Reviewed - No data to display Dg Chest 2 View  10/30/2011  *RADIOLOGY REPORT*  Clinical Data: Shortness of breath, smoker, sickle cell disease  CHEST - 2 VIEW  Comparison: 05/16/2011  Findings: Upper-normal size of cardiac silhouette. Mediastinal contours and pulmonary vascularity normal. Lungs clear. No pleural effusion or pneumothorax. No acute osseous findings.  IMPRESSION: No acute abnormalities.  Original Report Authenticated By: Lollie Marrow, M.D.   Dg Ankle Complete Left  10/30/2011   *RADIOLOGY REPORT*  Clinical Data: Sickle cell disease, left ankle pain for 5 days, no known injury  LEFT ANKLE COMPLETE - 3+ VIEW  Comparison: None  Findings: Ankle mortise intact. Osseous mineralization normal. No acute fracture, dislocation, or bone destruction  IMPRESSION: No acute osseous abnormalities.  Original Report Authenticated By: Lollie Marrow, M.D.     1. Sickle cell pain crisis   2. Anemia   3. Hypoxia       MDM  25yo AAM with PMH significant for sickle cell disease homozygous type with prior cholecystectomy who presents to the ED due to pain in left ankle. Onset about 4 days ago after cold exposure. Pt with scleral icterus. Left ankle with pain all around ankle worse with active ROM. No increased warmth to touch or redness. Pt with hypoxia in triage 84%. Placed on 2L nasal cannula and sats 94%. No increased work of breathing. States that he had some dyspnea earlier but denies chest pain or dyspnea at this time. No calf swelling. Normal HR. Doubt PE. Will get CXR and labs. Giving dilaudid for pain. Repeat  temp 99.6. Doubt septic arthritis.   CXR w/o evidence of acute chest. Pt reassessed no dyspnea, CP, or increased WOB. Sats 94-95% on 2L. Hg7. Retic ok. Not c/w aplastic crisis.   Attempted to turn of o2 pt desaturated to 84-85% with good waveform on room air but no respiratory distress. Placed back on nasal cannula and sat back up to 94%. Perhaps low o2 carrying capacity with Hg 7. Pain improving with dilaudid.  Medicine consulted for admission.         Verne Carrow, MD 10/30/11 [phone]

## 2011-10-30 NOTE — ED Notes (Signed)
Patient refused to let me take a rectal temperature.

## 2011-10-30 NOTE — ED Notes (Signed)
Lab to bedside, then pt to x-ray

## 2011-10-30 NOTE — ED Notes (Signed)
MD at bedside. 

## 2011-10-30 NOTE — H&P (Addendum)
Philip Rios is an 26 y.o. male.   PCP - Dr.Edwin Avbuerre Chief Complaint: Left foot pain. HPI: 25 year-old male with history of sickle cell disease has been experiencing left foot pain over the last 4 days. The pain is present even at rest and increased on applying pressure. Denies any pain in other joints and denies any fever chills. Patient was found mildly hypoxic in the ER which got better with 2 L of oxygen. Patient denies any chest pain shortness breath cough or phlegm. In any abdominal pain nausea vomiting or diarrhea. Patient admitted for further pain management could be probably from sickle cell crisis.  Past Medical History  Diagnosis Date  . Sickle cell disease, type SS   . Stroke     Past Surgical History  Procedure Date  . Cholecystectomy     Family History  Problem Relation Age of Onset  . Diabetes Sister    Social History:  does not have a smoking history on file. He does not have any smokeless tobacco history on file. He reports that he drinks about 6 ounces of alcohol per week. He reports that he uses illicit drugs (Marijuana).  Allergies:  Allergies  Allergen Reactions  . Codeine Hives    Medications Prior to Admission  Medication Dose Route Frequency Provider Last Rate Last Dose  . 0.9 %  sodium chloride infusion   Intravenous Once Verne Carrow, MD 150 mL/hr at 10/30/11 2136    . HYDROmorphone (DILAUDID) injection 1 mg  1 mg Intravenous Once Verne Carrow, MD   1 mg at 10/30/11 2137  . HYDROmorphone (DILAUDID) injection 1 mg  1 mg Intravenous Once Verne Carrow, MD   1 mg at 10/30/11 2231  . ketorolac (TORADOL) 30 MG/ML injection 30 mg  30 mg Intravenous Once Verne Carrow, MD   30 mg at 10/30/11 2225   Medications Prior to Admission  Medication Sig Dispense Refill  . hydroxyurea (HYDREA) 500 MG capsule Take 1,000-1,500 mg by mouth daily. Alternates between 2 and 3 tablets each day.      . oxyCODONE-acetaminophen (PERCOCET) 5-325 MG per tablet  Take 1-2 tablets by mouth every 6 (six) hours as needed. For pain        Results for orders placed during the hospital encounter of 10/30/11 (from the past 48 hour(s))  CBC     Status: Abnormal   Collection Time   10/30/11  8:54 PM      Component Value Range Comment   WBC 12.4 (*) 4.0 - 10.5 (K/uL) ADJUSTED FOR NUCLEATED RBC'S   RBC 2.18 (*) 4.22 - 5.81 (MIL/uL)    Hemoglobin 7.1 (*) 13.0 - 17.0 (g/dL)    HCT 16.1 (*) 09.6 - 52.0 (%)    MCV 85.3  78.0 - 100.0 (fL)    MCH 32.6  26.0 - 34.0 (pg)    MCHC 38.2 (*) 30.0 - 36.0 (g/dL) SICKLE CELLS   RDW 04.5 (*) 11.5 - 15.5 (%)    Platelets 313  150 - 400 (K/uL)   DIFFERENTIAL     Status: Abnormal   Collection Time   10/30/11  8:54 PM      Component Value Range Comment   Neutrophils Relative 76  43 - 77 (%)    Lymphocytes Relative 9 (*) 12 - 46 (%)    Monocytes Relative 12  3 - 12 (%)    Eosinophils Relative 3  0 - 5 (%)    Basophils Relative 0  0 - 1 (%)    Neutro Abs 9.4 (*) 1.7 - 7.7 (K/uL)    Lymphs Abs 1.1  0.7 - 4.0 (K/uL)    Monocytes Absolute 1.5 (*) 0.1 - 1.0 (K/uL)    Eosinophils Absolute 0.4  0.0 - 0.7 (K/uL)    Basophils Absolute 0.0  0.0 - 0.1 (K/uL)    RBC Morphology POLYCHROMASIA PRESENT     COMPREHENSIVE METABOLIC PANEL     Status: Abnormal   Collection Time   10/30/11  8:54 PM      Component Value Range Comment   Sodium 135  135 - 145 (mEq/L)    Potassium 3.9  3.5 - 5.1 (mEq/L)    Chloride 99  96 - 112 (mEq/L)    CO2 25  19 - 32 (mEq/L)    Glucose, Bld 90  70 - 99 (mg/dL)    BUN 8  6 - 23 (mg/dL)    Creatinine, Ser 4.09  0.50 - 1.35 (mg/dL) ICTERUS AT THIS LEVEL MAY AFFECT RESULT   Calcium 9.3  8.4 - 10.5 (mg/dL)    Total Protein 7.7  6.0 - 8.3 (g/dL)    Albumin 3.9  3.5 - 5.2 (g/dL)    AST 75 (*) 0 - 37 (U/L)    ALT 22  0 - 53 (U/L)    Alkaline Phosphatase 70  39 - 117 (U/L)    Total Bilirubin 15.8 (*) 0.3 - 1.2 (mg/dL)    GFR calc non Af Amer >90  >90 (mL/min)    GFR calc Af Amer >90  >90 (mL/min)     RETICULOCYTES     Status: Abnormal   Collection Time   10/30/11  8:54 PM      Component Value Range Comment   Retic Ct Pct 30.3 (*) 0.4 - 3.1 (%) RESULTS CONFIRMED BY MANUAL DILUTION   RBC. 2.18 (*) 4.22 - 5.81 (MIL/uL)    Retic Count, Manual 660.5 (*) 19.0 - 186.0 (K/uL)    Dg Chest 2 View  10/30/2011  *RADIOLOGY REPORT*  Clinical Data: Shortness of breath, smoker, sickle cell disease  CHEST - 2 VIEW  Comparison: 05/16/2011  Findings: Upper-normal size of cardiac silhouette. Mediastinal contours and pulmonary vascularity normal. Lungs clear. No pleural effusion or pneumothorax. No acute osseous findings.  IMPRESSION: No acute abnormalities.  Original Report Authenticated By: Lollie Marrow, M.D.   Dg Ankle Complete Left  10/30/2011  *RADIOLOGY REPORT*  Clinical Data: Sickle cell disease, left ankle pain for 5 days, no known injury  LEFT ANKLE COMPLETE - 3+ VIEW  Comparison: None  Findings: Ankle mortise intact. Osseous mineralization normal. No acute fracture, dislocation, or bone destruction  IMPRESSION: No acute osseous abnormalities.  Original Report Authenticated By: Lollie Marrow, M.D.    Review of Systems  Constitutional: Negative.   Eyes: Negative.   Respiratory: Negative.   Cardiovascular: Negative.   Gastrointestinal: Negative.   Genitourinary: Negative.   Musculoskeletal:       Left foot pain.  Skin: Negative.   Neurological: Negative.   Endo/Heme/Allergies: Negative.   Psychiatric/Behavioral: Negative.     Blood pressure 119/67, pulse 78, temperature 99.1 F (37.3 C), temperature source Oral, resp. rate 18, SpO2 97.00%. Physical Exam  Constitutional: He is oriented to person, place, and time. He appears well-developed and well-nourished. No distress.  HENT:  Head: Normocephalic and atraumatic.  Right Ear: External ear normal.  Left Ear: External ear normal.  Nose: Nose normal.  Mouth/Throat: Oropharynx is clear and moist.  Eyes: Conjunctivae and EOM are normal.  Pupils are equal, round, and reactive to light. Right eye exhibits no discharge. Left eye exhibits no discharge. No scleral icterus.  Neck: Normal range of motion. Neck supple.  Cardiovascular: Normal rate, regular rhythm and normal heart sounds.   Respiratory: Effort normal and breath sounds normal. No respiratory distress. He has no wheezes. He has no rales.  GI: Soft. Bowel sounds are normal. He exhibits no distension. There is no tenderness. There is no rebound.  Musculoskeletal: Normal range of motion. He exhibits no edema and no tenderness.       Normal range of motion in left foot. No obvious swelling.  Neurological: He is alert and oriented to person, place, and time.       Moves upper and lower limbs 5/5.  Skin: Skin is warm and dry. No rash noted. He is not diaphoretic. No erythema.  Psychiatric: His behavior is normal.     Assessment/Plan #1. Left foot pain with sickle cell disease possibly of sickle cell crisis - we will hydrate patient and keep patient on pain relief medications. We'll also consult PT. If the pain persists patient may need MRI. The differentials include possibility of avascular necrosis. #2. 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 #3. Mildly hypoxic - patient is not short of breath and the x-rays does not show any acute. We will observe in telemetry. If he gets more short of breath then we may have to recheck chest x-ray and also get a 2-D echo to rule out pulmonary hypertension. #4. Polysubstance abuse including alcohol and marijuana -  Patient will be placed on CIWA protocol with as needed Ativan. #5. Mild leukocytosis - patient's temperature is around 22F. Does not have any cough does not complain of any dysuria. We will check a urinalysis now.  CODE STATUS - full code.  Eduard Clos. 10/30/2011, 11:53 PM

## 2011-10-30 NOTE — ED Notes (Addendum)
Pt aware of plan to admit

## 2011-10-30 NOTE — ED Notes (Signed)
Pt refused rectal temp.

## 2011-10-31 ENCOUNTER — Inpatient Hospital Stay (HOSPITAL_COMMUNITY): Payer: Medicaid Other

## 2011-10-31 ENCOUNTER — Encounter (HOSPITAL_COMMUNITY): Payer: Self-pay | Admitting: *Deleted

## 2011-10-31 LAB — URINALYSIS, ROUTINE W REFLEX MICROSCOPIC
Glucose, UA: NEGATIVE mg/dL
Protein, ur: 30 mg/dL — AB
Urobilinogen, UA: 1 mg/dL (ref 0.0–1.0)

## 2011-10-31 LAB — CBC
HCT: 17.2 % — ABNORMAL LOW (ref 39.0–52.0)
Hemoglobin: 6.2 g/dL — CL (ref 13.0–17.0)
MCHC: 36 g/dL (ref 30.0–36.0)
MCV: 87.3 fL (ref 78.0–100.0)
RDW: 32.8 % — ABNORMAL HIGH (ref 11.5–15.5)

## 2011-10-31 LAB — URINE MICROSCOPIC-ADD ON

## 2011-10-31 LAB — COMPREHENSIVE METABOLIC PANEL
Alkaline Phosphatase: 62 U/L (ref 39–117)
BUN: 9 mg/dL (ref 6–23)
CO2: 26 mEq/L (ref 19–32)
Chloride: 103 mEq/L (ref 96–112)
GFR calc Af Amer: >90 mL/min (ref >90)
Glucose, Bld: 94 mg/dL (ref 70–99)
Potassium: 4.1 mEq/L (ref 3.5–5.1)
Total Bilirubin: 11.1 mg/dL — ABNORMAL HIGH (ref 0.3–1.2)

## 2011-10-31 LAB — RAPID URINE DRUG SCREEN, HOSP PERFORMED: Opiates: NOT DETECTED

## 2011-10-31 MED ORDER — ACETAMINOPHEN 325 MG PO TABS
650.0000 mg | ORAL_TABLET | Freq: Four times a day (QID) | ORAL | Status: DC | PRN
  Administered 2011-10-31: 650 mg via ORAL
  Filled 2011-10-31: qty 2

## 2011-10-31 MED ORDER — DIPHENHYDRAMINE HCL 25 MG PO CAPS
25.0000 mg | ORAL_CAPSULE | Freq: Four times a day (QID) | ORAL | Status: DC | PRN
  Administered 2011-10-31: 25 mg via ORAL
  Filled 2011-10-31: qty 1

## 2011-10-31 MED ORDER — FOLIC ACID 1 MG PO TABS
1.0000 mg | ORAL_TABLET | Freq: Every day | ORAL | Status: DC
  Administered 2011-10-31 – 2011-11-05 (×6): 1 mg via ORAL
  Filled 2011-10-31 (×7): qty 1

## 2011-10-31 MED ORDER — NAPHAZOLINE HCL 0.1 % OP SOLN
1.0000 [drp] | Freq: Four times a day (QID) | OPHTHALMIC | Status: DC | PRN
  Administered 2011-10-31 (×2): 1 [drp] via OPHTHALMIC
  Filled 2011-10-31: qty 15

## 2011-10-31 MED ORDER — CEFTRIAXONE SODIUM 1 G IJ SOLR
1.0000 g | INTRAMUSCULAR | Status: DC
  Administered 2011-10-31 – 2011-11-05 (×6): 1 g via INTRAVENOUS
  Filled 2011-10-31 (×7): qty 10

## 2011-10-31 MED ORDER — HYDROMORPHONE HCL PF 1 MG/ML IJ SOLN
2.0000 mg | INTRAMUSCULAR | Status: DC | PRN
  Administered 2011-10-31 – 2011-11-01 (×8): 2 mg via INTRAVENOUS
  Filled 2011-10-31 (×8): qty 2

## 2011-10-31 MED ORDER — ACETAMINOPHEN 650 MG RE SUPP: 650.0000 mg | Freq: Four times a day (QID) | RECTAL | Status: DC | PRN

## 2011-10-31 MED ORDER — HYDROXYUREA 500 MG PO CAPS
1000.0000 mg | ORAL_CAPSULE | ORAL | Status: DC
  Administered 2011-11-01 – 2011-11-05 (×3): 1000 mg via ORAL
  Filled 2011-10-31 (×3): qty 2

## 2011-10-31 NOTE — Progress Notes (Signed)
Patient ID: Philip Rios, male   DOB: 1986/06/21, 26 y.o.   MRN: [id number] Subjective: Left Ankle pain unchanged.  Has been going on since 1/26.  Seen in ED 1/26, 1/27.  Has had cut on left tibial area.  Also complaining of Right Eye irritation / pain without changes in vision.  Patient reports his eyes have always been yellow.  Usually has approx 4 sickle cell pain crises a year.  Usually stays in the hospital for 1-2 days with a pain crisis.  Sickle cell pain is normally located in his sides, chest, back and occasionally sacral area.  Has never had pain in his left ankle before from sickle cell.  Objective: Weight change:   Intake/Output Summary (Last 24 hours) at 10/31/11 1109 Last data filed at 10/31/11 0900  Gross per 24 hour  Intake 872.17 ml  Output    950 ml  Net -77.83 ml   Blood pressure 95/51, pulse 63, temperature 97.9 F (36.6 C), temperature source Oral, resp. rate 20, height 5\' 11"  (1.803 m), weight 61.4 kg (135 lb 5.8 oz), SpO2 97.00%. Filed Vitals:   10/30/11 2359 10/31/11 0033 10/31/11 0500 10/31/11 0655  BP:  119/69  95/51  Pulse:  71 63 63  Temp:  98.8 F (37.1 C) 97.9 F (36.6 C) 97.9 F (36.6 C)  TempSrc:  Oral Oral Oral  Resp:  20  20  Height:  5\' 11"  (1.803 m)    Weight:  61.4 kg (135 lb 5.8 oz)    SpO2: 96% 96%  97%   Tele:  Has converted to 1st degree AV block.  Physical Exam: General: No acute distress, thin, 2+ icterus Lungs: Clear to auscultation bilaterally without wheezes or crackles Cardiovascular: Regular rate and rhythm without murmur gallop or rub normal S1 and S2 Abdomen: Nontender, nondistended, soft, bowel sounds positive, no rebound, no ascites, no appreciable mass Extremities: No significant cyanosis, clubbing, or edema bilateral lower extremities.  Left ankle tender to palpation.  Decreased ROM in comparison to right.  Small well-healing lesion in the pretibial area with no erythema, there is some swelling, but it does not appear to be  infected.  Basic Metabolic Panel:  Lab 10/31/11 4098 10/30/11 2054  NA 138 135  K 4.1 3.9  CL 103 99  CO2 26 25  GLUCOSE 94 90  BUN 9 8  CREATININE 0.69 0.75  CALCIUM 8.7 9.3  MG -- --  PHOS -- --   Liver Function Tests:  Lab 10/31/11 0520 10/30/11 2054  AST 59* 75*  ALT 19 22  ALKPHOS 62 70  BILITOT 11.1* 15.8*  PROT 6.8 7.7  ALBUMIN 3.2* 3.9   CBC:  Lab 10/31/11 0520 10/30/11 2054  WBC 10.7* 12.4*  NEUTROABS -- 9.4*  HGB 6.2* 7.1*  HCT 17.2* 18.6*  MCV 87.3 85.3  PLT 267 313   Anemia Panel:  Lab 10/30/11 2054  VITAMINB12 --  FOLATE --  FERRITIN --  TIBC --  IRON --  RETICCTPCT 30.3*   Urine Drug Screen: Drugs of Abuse     Component Value Date/Time   LABOPIA NONE DETECTED 10/31/2011 0153   COCAINSCRNUR NONE DETECTED 10/31/2011 0153   LABBENZ NONE DETECTED 10/31/2011 0153   AMPHETMU NONE DETECTED 10/31/2011 0153   THCU POSITIVE* 10/31/2011 0153   LABBARB NONE DETECTED 10/31/2011 0153     Studies/Results: Scheduled Meds:   . sodium chloride   Intravenous Once  . folic acid  1 mg Oral Daily  .  HYDROmorphone (  DILAUDID) injection  1 mg Intravenous Once  .  HYDROmorphone (DILAUDID) injection  1 mg Intravenous Once  . hydroxyurea  1,000 mg Oral Q48H  . hydroxyurea  1,500 mg Oral Q48H  . ketorolac  30 mg Intravenous Once  . mulitivitamin with minerals  1 tablet Oral Daily  . thiamine  100 mg Oral Daily   Or  . thiamine  100 mg Intravenous Daily  . DISCONTD: folic acid  1 mg Oral Daily  . DISCONTD: folic acid  1 mg Oral Daily   Continuous Infusions:   . sodium chloride 125 mL/hr at 10/31/11 0857   PRN Meds:.acetaminophen, acetaminophen, HYDROcodone-acetaminophen, HYDROmorphone, LORazepam, LORazepam, ondansetron (ZOFRAN) IV, ondansetron  Assessment/Plan: Principal Problem:  *Left foot pain Active Problems:  Hypoxia  Sickle cell disease  1.  Left Ankle Pain - not improved over 3 -- 4 day period with visits to ED.  Will move forward with MRI to  rule out AVN 2.  Urinary Tract Infection - will await cultures and start Rocephin 1/30 3.  Sickle Cell Anemia - baseline is approx 7.0  Currently 6.2.  Considering transfusion. 4.  Rt Eye irritation / pain 4/10.  Will try visine drops.    LOS: 1 day   Stephani Police 10/31/2011, 11:09 AM [phone]  Addendum:  MRI Left Ankle indicates probable Infarct.  Dr. Willey Blade Consulted.

## 2011-10-31 NOTE — Progress Notes (Signed)
Utilization review completed. Philip Rios 10/31/2011 

## 2011-10-31 NOTE — Progress Notes (Signed)
CRITICAL VALUE ALERT  Critical value received:  Hgb 6.2  Date of notification:  10/31/2011   Time of notification:  6:06 AM   Critical value read back:yes  Nurse who received alert:  Hively, Avie Echevaria, RN  MD notified (1st page):  Maren Reamer, PA  Time of first page:  6:06 AM   MD notified (2nd page):  Time of second page:  Responding MD:  Maren Reamer, PA  Time MD responded:  [phone]

## 2011-10-31 NOTE — Progress Notes (Signed)
Patient seen and examined this am. Agree with assessment and plan as outlined above  MRI of left foot comments findings of possible  active marrow vs infarct vs osteomyelitis. Patient has good ROM on exam and palpable distal pulses. There is no erythema or swelling and patient is afebrile as well. Discussed with Dr dean who will evaluate patient in the morning. will repeat H&h and if still low with transfuse 1 U PRBC.

## 2011-10-31 NOTE — Progress Notes (Signed)
PT Cancellation Note  Treatment cancelled today due to medical issues with patient which prohibited therapy. Pt's Hemoglobin 6.2 this morning.  Pt also awaiting MRI of Lt ankle to r/o AVN.   Will follow-up on pt tomorrow   Pacer Dorn 10/31/2011, 4:06 PM Michal Callicott L. Mayzie Caughlin DPT [phone]

## 2011-10-31 NOTE — Progress Notes (Signed)
10/31/2011 .Philip Rios Case Management Note [phone]    CARE MANAGEMENT NOTE 10/31/2011  Patient:  Philip Rios, Philip Rios   Account Number:  [id number]  Date Initiated:  10/31/2011  Documentation initiated by:  Philip Michael  Subjective/Objective Assessment:   admitted on 10/30/11 with sickle cell crisis.     Action/Plan:   prior to admission, patient lived at home with family support and was independent with ADLs   Anticipated DC Date:  11/03/2011   Anticipated DC Plan:  HOME/SELF CARE      DC Planning Services  CM consult      Choice offered to / List presented to:             Status of service:  Completed, signed off Medicare Important Message given?   (If response is "NO", the following Medicare IM given date fields will be blank) Date Medicare IM given:   Date Additional Medicare IM given:    Discharge Disposition:  HOME/SELF CARE  Per UR Regulation:  Reviewed for med. necessity/level of care/duration of stay  Comments:  10/31/11-1131-Philip Rios  [phone]      25yo male patient admitted on 10/30/11 with sickle cell crisis. Prior to admission, patient lived at home with family support and was independent with ADLs. In to see patient. Anticipated plan will be to discharge home to self care. No discharge needs identified. CM will continue to monitor.

## 2011-10-31 NOTE — ED Provider Notes (Signed)
I saw and evaluated the patient, reviewed the resident's note and I agree with the findings and plan. Sickle cell foot pain. Desaturations to 8485%. Pain has been improved, but due to the hypoxia he was admitted  Juliet Rude. Rubin Payor, MD 10/31/11 1610

## 2011-11-01 LAB — CBC
MCH: 31.5 pg (ref 26.0–34.0)
MCHC: 36.5 g/dL — ABNORMAL HIGH (ref 30.0–36.0)
MCV: 86.4 fL (ref 78.0–100.0)
Platelets: 293 10*3/uL (ref 150–400)
RDW: 26.9 % — ABNORMAL HIGH (ref 11.5–15.5)
WBC: 8.7 10*3/uL (ref 4.0–10.5)

## 2011-11-01 LAB — TYPE AND SCREEN

## 2011-11-01 MED ORDER — ONDANSETRON HCL 4 MG/2ML IJ SOLN: 4.0000 mg | Freq: Four times a day (QID) | INTRAMUSCULAR | Status: DC | PRN

## 2011-11-01 MED ORDER — DEXTROSE-NACL 5-0.2 % IV SOLN
INTRAVENOUS | Status: DC
  Administered 2011-11-02 – 2011-11-05 (×5): via INTRAVENOUS
  Filled 2011-11-01 (×3): qty 1000

## 2011-11-01 MED ORDER — HYDROMORPHONE 0.3 MG/ML IV SOLN
INTRAVENOUS | Status: DC
  Administered 2011-11-02: 3 mL via INTRAVENOUS
  Administered 2011-11-02: 2.41 mL via INTRAVENOUS
  Administered 2011-11-02: 3 mL via INTRAVENOUS
  Administered 2011-11-02: 1.5 mg via INTRAVENOUS
  Administered 2011-11-02: via INTRAVENOUS
  Administered 2011-11-02: 1.9 mg via INTRAVENOUS
  Administered 2011-11-03: 3 mg via INTRAVENOUS
  Administered 2011-11-03: 25 mL via INTRAVENOUS
  Administered 2011-11-03: 2.4 mg via INTRAVENOUS
  Administered 2011-11-03: 4 mg via INTRAVENOUS
  Administered 2011-11-04: 1.2 mg via INTRAVENOUS
  Administered 2011-11-04: 0.4 mg via INTRAVENOUS
  Administered 2011-11-04: 0.79 mg via INTRAVENOUS
  Administered 2011-11-04: 0.4 mg via INTRAVENOUS
  Administered 2011-11-05: 1.19 mg via INTRAVENOUS
  Administered 2011-11-05: 0.8 mg via INTRAVENOUS
  Filled 2011-11-01 (×5): qty 25

## 2011-11-01 MED ORDER — DIPHENHYDRAMINE HCL 50 MG/ML IJ SOLN: 12.5000 mg | Freq: Four times a day (QID) | INTRAMUSCULAR | Status: DC | PRN

## 2011-11-01 MED ORDER — NALOXONE HCL 0.4 MG/ML IJ SOLN: 0.4000 mg | INTRAMUSCULAR | Status: DC | PRN

## 2011-11-01 MED ORDER — SODIUM CHLORIDE 0.9 % IJ SOLN: 9.0000 mL | INTRAMUSCULAR | Status: DC | PRN

## 2011-11-01 MED ORDER — DIPHENHYDRAMINE HCL 12.5 MG/5ML PO ELIX
12.5000 mg | ORAL_SOLUTION | Freq: Four times a day (QID) | ORAL | Status: DC | PRN
  Filled 2011-11-01: qty 5

## 2011-11-01 MED ORDER — HYDROMORPHONE HCL 2 MG PO TABS
1.0000 mg | ORAL_TABLET | Freq: Once | ORAL | Status: AC
  Administered 2011-11-01: 1 mg via ORAL
  Filled 2011-11-01: qty 1

## 2011-11-01 NOTE — Progress Notes (Signed)
PATIENT DETAILS Name: Philip Rios Age: 26 y.o. Sex: male Date of Birth: Sep 30, 1986 Admit Date: 10/30/2011 UJW:[id number],GNFAO A, MD, MD POA:   CONSULTS: Dr August Saucer  Subjective: Left ankle pain improved, but still with a great deal of "pressure". Symptoms worse after walking. Denies any cp, sob. Received one unit PRBCs last pm.   Objective: Vital signs in last 24 hours: Temp:  [97.5 F (36.4 C)-98.3 F (36.8 C)] 98.1 F (36.7 C) (01/31 0634) Pulse Rate:  [64-77] 64  (01/31 0634) Resp:  [16-19] 18  (01/31 0634) BP: (98-112)/(60-68) 112/67 mmHg (01/31 0634) SpO2:  [96 %-99 %] 99 % (01/31 0634) Weight change:  Last BM Date: 10/31/11  Intake/Output from previous day:  Intake/Output Summary (Last 24 hours) at 11/01/11 1112 Last data filed at 11/01/11 0900  Gross per 24 hour  Intake   1724 ml  Output   1980 ml  Net   -256 ml     Physical Exam:  Gen:  Awake, alert in NAD Cardiovascular:  S1S2 RRR, no m/r/g Respiratory: CTAB, no w/r/c, no increased wob Gastrointestinal: abdomen soft, NT/ND, BS+ Extremities: very mild swelling to left lateral ankle.    Lab Results:  Lab 11/01/11 0515 10/31/11 0520 10/30/11 2054  HGB 7.4* 6.2* 7.1*  HCT 20.3* 17.2* 18.6*  WBC 8.7 10.7* 12.4*  PLT 293 267 313     Lab 10/31/11 0520 10/30/11 2054 10/28/11 0409  NA 138 135 142  K 4.1 3.9 --  CL 103 99 107  CO2 26 25 --  GLUCOSE 94 90 103*  BUN 9 8 8   CREATININE 0.69 0.75 0.50  CALCIUM 8.7 9.3 --  MG -- -- --  PHOS -- -- --    Studies/Results: Dg Chest 2 View  10/30/2011  *RADIOLOGY REPORT*  Clinical Data: Shortness of breath, smoker, sickle cell disease  CHEST - 2 VIEW  Comparison: 05/16/2011  Findings: Upper-normal size of cardiac silhouette. Mediastinal contours and pulmonary vascularity normal. Lungs clear. No pleural effusion or pneumothorax. No acute osseous findings.  IMPRESSION: No acute abnormalities.  Original Report Authenticated By: Lollie Marrow, M.D.   Dg Ankle  Complete Left  10/30/2011  *RADIOLOGY REPORT*  Clinical Data: Sickle cell disease, left ankle pain for 5 days, no known injury  LEFT ANKLE COMPLETE - 3+ VIEW  Comparison: None  Findings: Ankle mortise intact. Osseous mineralization normal. No acute fracture, dislocation, or bone destruction  IMPRESSION: No acute osseous abnormalities.  Original Report Authenticated By: Lollie Marrow, M.D.   Mr Foot Left Wo Contrast  10/31/2011  *RADIOLOGY REPORT*  Clinical Data: Pain.  Sickle cell disease.  Question avascular necrosis.  MRI OF THE LEFT FOREFOOT WITHOUT CONTRAST  Technique:  Multiplanar, multisequence MR imaging was performed. No intravenous contrast was administered.  Comparison: None.  Findings: There are scattered areas of marrow signal abnormality, most prominent in the proximal aspect of the first metatarsal. Additional areas of marrow signal abnormality is seen in the distal talus, navicular, cuboid, proximal second metatarsal and medial cuneiform.  No fracture is identified.  No fluid collection or mass is seen.  Intrinsic musculature appears normal.  IMPRESSION: Multi focal areas of marrow signal abnormality most prominent in the proximal first metatarsal.  Findings could be due to the presence of active marrow, infarct or possibly osteomyelitis.  Original Report Authenticated By: Bernadene Bell. Maricela Curet, M.D.   Mr Ankle Left  Wo Contrast  10/31/2011  *RADIOLOGY REPORT*  Clinical Data: Severe pain.  History of sickle cell disease.  MRI OF THE LEFT ANKLE WITHOUT CONTRAST  Technique:  Multiplanar, multisequence MR imaging was performed. No intravenous contrast was administered.  Comparison: None.  Findings: There is multi focal decreased T1 marrow signal, particularly in the distal tibia.  The patient has an ankle joint effusion.  There is subcutaneous edema about the ankle with mild muscular edema also seen scattered in intrinsic musculature of the foot.  No focal fluid collection is identified.  Tendons  appear intact.  No ligament tear is identified.  IMPRESSION: Multi focal marrow signal abnormality appearing most intense in the distal tibia is nonspecific.  Findings could be due to bone infarcts, the presence of active marrow or possibly osteomyelitis.  Original Report Authenticated By: Bernadene Bell. Maricela Curet, M.D.    Medications: Scheduled Meds:   . cefTRIAXone (ROCEPHIN)  IV  1 g Intravenous Q24H  . folic acid  1 mg Oral Daily  . HYDROmorphone  1 mg Oral Once  . hydroxyurea  1,000 mg Oral Q48H  . hydroxyurea  1,500 mg Oral Q48H  . mulitivitamin with minerals  1 tablet Oral Daily  . thiamine  100 mg Oral Daily  . DISCONTD: thiamine  100 mg Intravenous Daily   Continuous Infusions:   . sodium chloride 100 mL/hr at 10/31/11 2113   PRN Meds:.acetaminophen, acetaminophen, diphenhydrAMINE, HYDROcodone-acetaminophen, HYDROmorphone, LORazepam, LORazepam, naphazoline, ondansetron (ZOFRAN) IV, ondansetron, DISCONTD: acetaminophen, DISCONTD: acetaminophen, DISCONTD: HYDROmorphone Antibiotics: Anti-infectives     Start     Dose/Rate Route Frequency Ordered Stop   10/31/11 1200   cefTRIAXone (ROCEPHIN) 1 g in dextrose 5 % 50 mL IVPB        1 g 100 mL/hr over 30 Minutes Intravenous Every 24 hours 10/31/11 1120             Assessment/Plan:  1. Left ankle pain with Sickle Cell Dz - Improved today. No evidence of infection on exam. MRI with question of infarct. Low suspicion for osteo. ?r related to SS crisis. Dr August Saucer has been consulted.  2. UTI - on Rocephin D2. Not sure culture was sent. Will reorder today.  3. Anemia of chronic dz - baseline hgb ~7.0. S/p one unit PRBCs overnight. Hbg 7.4<6.2. Will continue to monitor.   4. Polysubstance abuse - THC/etoh. Pt counseled. No signs of withdrawal.      LOS: 2 days    11/01/2011, 11:12 AM

## 2011-11-01 NOTE — Progress Notes (Signed)
Pt c/o RT eye pain.  States that when he wakes up, there is "crust" around the outside of his eye and when he touches it, there is pain.  Denies itching, visual disturbances.  Belia Heman, NP notified and she stated that Dr. August Saucer can look at it when he rounds on pt. Will continue to monitor pt.

## 2011-11-01 NOTE — Consult Note (Signed)
Reason for Consult: Evaluation of patient with sickle cell crisis and left ankle pain. ` Referring Physician: [facility] hospitalist  Philip Rios is an 26 y.o. male with hemoglobin SS disease who presented to the hospital complaining of increasing left foot pain of several days' duration. 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 He noted increasing pain in his foot which was unusual. This progressed and he subsequently came to the emergency room for further evaluation. Patient was admitted thereafter for sickle cell crisis with new onset left foot involvement. MRI scan raised the question of osteomyelitis versus sickle cell osteopathy. Patient denies recent fever or chills night sweats no cough. Since being in the hospital he has noted increasing right eye pain which has been throbbing in nature. He has had no drainage with some whitish dried secretions in a.m. He has not been around anyone with similar symptoms. Denies any yellow green secretions. Patient presently rates his pain as a 7/10 tonight.  Past Medical History  Diagnosis Date  . Sickle cell disease, type SS   . Stroke   . Asthma     Past Surgical History  Procedure Date  . Cholecystectomy     Family History  Problem Relation Age of Onset  . Diabetes Sister     Social History:  reports that he has never smoked. He does not have any smokeless tobacco history on file. He reports that he drinks about 6 ounces of alcohol per week. He reports that he uses illicit drugs (Marijuana).  Allergies:  Allergies  Allergen Reactions  . Codeine Hives    ROS: As noted above.  Results for orders placed during the hospital encounter of 10/30/11 (from the past 48 hour(s))  URINALYSIS, ROUTINE W REFLEX MICROSCOPIC     Status: Abnormal   Collection Time   10/31/11  1:53 AM      Component Value Range Comment   Color, Urine ORANGE (*) YELLOW  BIOCHEMICALS MAY BE AFFECTED  BY COLOR   APPearance CLEAR  CLEAR     Specific Gravity, Urine 1.012  1.005 - 1.030     pH 6.0  5.0 - 8.0     Glucose, UA NEGATIVE  NEGATIVE (mg/dL)    Hgb urine dipstick SMALL (*) NEGATIVE     Bilirubin Urine MODERATE (*) NEGATIVE     Ketones, ur NEGATIVE  NEGATIVE (mg/dL)    Protein, ur 30 (*) NEGATIVE (mg/dL)    Urobilinogen, UA 1.0  0.0 - 1.0 (mg/dL)    Nitrite POSITIVE (*) NEGATIVE     Leukocytes, UA SMALL (*) NEGATIVE    URINE RAPID DRUG SCREEN (HOSP PERFORMED)     Status: Abnormal   Collection Time   10/31/11  1:53 AM      Component Value Range Comment   Opiates NONE DETECTED  NONE DETECTED     Cocaine NONE DETECTED  NONE DETECTED     Benzodiazepines NONE DETECTED  NONE DETECTED     Amphetamines NONE DETECTED  NONE DETECTED     Tetrahydrocannabinol POSITIVE (*) NONE DETECTED     Barbiturates NONE DETECTED  NONE DETECTED    URINE MICROSCOPIC-ADD ON     Status: Abnormal   Collection Time   10/31/11  1:53 AM      Component Value Range Comment   Squamous Epithelial / LPF RARE  RARE     WBC, UA 21-50  <3 (WBC/hpf)    RBC /  HPF 3-6  <3 (RBC/hpf)    Bacteria, UA MANY (*) RARE    COMPREHENSIVE METABOLIC PANEL     Status: Abnormal   Collection Time   10/31/11  5:20 AM      Component Value Range Comment   Sodium 138  135 - 145 (mEq/L)    Potassium 4.1  3.5 - 5.1 (mEq/L)    Chloride 103  96 - 112 (mEq/L)    CO2 26  19 - 32 (mEq/L)    Glucose, Bld 94  70 - 99 (mg/dL)    BUN 9  6 - 23 (mg/dL)    Creatinine, Ser 1.61  0.50 - 1.35 (mg/dL)    Calcium 8.7  8.4 - 10.5 (mg/dL)    Total Protein 6.8  6.0 - 8.3 (g/dL)    Albumin 3.2 (*) 3.5 - 5.2 (g/dL)    AST 59 (*) 0 - 37 (U/L)    ALT 19  0 - 53 (U/L)    Alkaline Phosphatase 62  39 - 117 (U/L)    Total Bilirubin 11.1 (*) 0.3 - 1.2 (mg/dL)    GFR calc non Af Amer >90  >90 (mL/min)    GFR calc Af Amer >90  >90 (mL/min)   CBC     Status: Abnormal   Collection Time   10/31/11  5:20 AM      Component Value Range Comment   WBC 10.7 (*)  4.0 - 10.5 (K/uL) WHITE COUNT CONFIRMED ON SMEAR   RBC 1.97 (*) 4.22 - 5.81 (MIL/uL)    Hemoglobin 6.2 (*) 13.0 - 17.0 (g/dL)    HCT 09.6 (*) 04.5 - 52.0 (%)    MCV 87.3  78.0 - 100.0 (fL)    MCH 31.5  26.0 - 34.0 (pg)    MCHC 36.0  30.0 - 36.0 (g/dL)    RDW 40.9 (*) 81.1 - 15.5 (%)    Platelets 267  150 - 400 (K/uL) PLATELET COUNT CONFIRMED BY SMEAR  TYPE AND SCREEN     Status: Normal   Collection Time   10/31/11  6:56 AM      Component Value Range Comment   ABO/RH(D) O NEG      Antibody Screen NEG      Sample Expiration 11/03/2011      Unit Number [id number]      Blood Component Type RED CELLS,LR      Unit division 00      Status of Unit ISSUED,FINAL      Donor AG Type        Value: NEGATIVE FOR C ANTIGEN NEGATIVE FOR E ANTIGEN NEGATIVE FOR KELL ANTIGEN   Transfusion Status OK TO TRANSFUSE      Crossmatch Result COMPATIBLE     PREPARE RBC (CROSSMATCH)     Status: Normal   Collection Time   10/31/11 10:00 PM      Component Value Range Comment   Order Confirmation ORDER PROCESSED BY BLOOD BANK     CBC     Status: Abnormal   Collection Time   11/01/11  5:15 AM      Component Value Range Comment   WBC 8.7  4.0 - 10.5 (K/uL)    RBC 2.35 (*) 4.22 - 5.81 (MIL/uL)    Hemoglobin 7.4 (*) 13.0 - 17.0 (g/dL)    HCT 62.1 (*) 30.8 - 52.0 (%)    MCV 86.4  78.0 - 100.0 (fL)    MCH 31.5  26.0 - 34.0 (pg)    MCHC 36.5 (*)  30.0 - 36.0 (g/dL)    RDW 16.1 (*) 09.6 - 15.5 (%)    Platelets 293  150 - 400 (K/uL) PLATELET COUNT CONFIRMED BY SMEAR    Mr Foot Left Wo Contrast  10/31/2011  *RADIOLOGY REPORT*  Clinical Data: Pain.  Sickle cell disease.  Question avascular necrosis.  MRI OF THE LEFT FOREFOOT WITHOUT CONTRAST  Technique:  Multiplanar, multisequence MR imaging was performed. No intravenous contrast was administered.  Comparison: None.  Findings: There are scattered areas of marrow signal abnormality, most prominent in the proximal aspect of the first metatarsal. Additional areas of marrow  signal abnormality is seen in the distal talus, navicular, cuboid, proximal second metatarsal and medial cuneiform.  No fracture is identified.  No fluid collection or mass is seen.  Intrinsic musculature appears normal.  IMPRESSION: Multi focal areas of marrow signal abnormality most prominent in the proximal first metatarsal.  Findings could be due to the presence of active marrow, infarct or possibly osteomyelitis.  Original Report Authenticated By: Bernadene Bell. Maricela Curet, M.D.   Mr Ankle Left  Wo Contrast  10/31/2011  *RADIOLOGY REPORT*  Clinical Data: Severe pain.  History of sickle cell disease.  MRI OF THE LEFT ANKLE WITHOUT CONTRAST  Technique:  Multiplanar, multisequence MR imaging was performed. No intravenous contrast was administered.  Comparison: None.  Findings: There is multi focal decreased T1 marrow signal, particularly in the distal tibia.  The patient has an ankle joint effusion.  There is subcutaneous edema about the ankle with mild muscular edema also seen scattered in intrinsic musculature of the foot.  No focal fluid collection is identified.  Tendons appear intact.  No ligament tear is identified.  IMPRESSION: Multi focal marrow signal abnormality appearing most intense in the distal tibia is nonspecific.  Findings could be due to bone infarcts, the presence of active marrow or possibly osteomyelitis.  Original Report Authenticated By: Bernadene Bell. Maricela Curet, M.D.   Blood pressure 106/64, pulse 74, temperature 98 F (36.7 C), temperature source Oral, resp. rate 18, height 5\' 11"  (1.803 m), weight 135 lb 5.8 oz (61.4 kg), SpO2 99.00%. Well-developed slender black male in mild distress. HEENT: No sinus tenderness. Minimal discomfort on pressure of the right eye orbit versus left. NECK: Bilateral small posterior cervical nodes nontender. LUNGS: Clear to auscultation. Decreased breath sounds at bases. No CVA tenderness. CV: Normal S1, S2 without S3. As a 1/6 systolic ejection murmur no lower  left sternal border. Intermittent midsystolic click murmur noted as well. Negative rub. ABDOMEN: No epigastric tenderness. MUSCLE skeletal: Swelling of the left ankle with minimal warmth. Tender to palpation. Pulses intact. No erythema appreciated. Right ankle intact. NEUROLOGICAL: Intact.   Assessment/Plan: Sickle cell crisis. Patient with hemoglobin SS disease with usual hemoglobin range between 7 and 8. Patient with severe crisis precipitated by excessive physical activity. 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 Increased risk for avascular necrosis. Clinically not consistent with osteomyelitis. Right eye pain. Rule out early conjunctivitis. Atypical headaches. Rule out secondary to #1 and risk for CVA event. Urinary tract infection rule out. Cultures still pending.  PLAN: Change IV to hypotonic saline. Continue empiric antibiotics. PICC line for exchange transfusion. Baseline CT scan of head. Rule out occult sinus disease as well. Topical eye drops for now. PCA Dilaudid pump for pain control. Incentive spirometry. OK to transfer to sickle cell service at [facility].   August Saucer, Burnice Oestreicher 11/01/2011, 9:46 PM

## 2011-11-01 NOTE — Progress Notes (Signed)
Patient seen and examined this am. Still has left foot and ankle pain but much better. States it to be 4/10 and aggravated only with movement.  assessment and plan as outlined reviewed awaiting Dr Diamantina Providence evaluation and recommendation regarding his left foot pain. Other wise stable. Received 1 U prbc yesterday evening . H&H stable at baseline.  Plan discussed with Ms Bretta Bang.

## 2011-11-01 NOTE — Evaluation (Signed)
Physical Therapy Evaluation and Discharge Patient Details Name: Philip Rios MRN: [id number] DOB: 1986-07-29 Today's Date: 11/01/2011  Problem List:  Patient Active Problem List  Diagnoses  . Left foot pain  . Hypoxia  . Sickle cell disease    Past Medical History:  Past Medical History  Diagnosis Date  . Sickle cell disease, type SS   . Stroke   . Asthma    Past Surgical History:  Past Surgical History  Procedure Date  . Cholecystectomy     PT Assessment/Plan/Recommendation PT Assessment Clinical Impression Statement: Pt is a 26 y/o male admitted for pain from sickle cell crises.  Today pt presents wuith signifcantly less pain in Lt foot.  Pt demonstrates independdence and safety with all mobility and has no further acute or follow up PT needs.  Pt should be appropriate to return to his home upon discharge from hospital. Pt may need a pair of axillary crutches if pain in Lt foot returns.     PT Recommendation/Assessment: Patent does not need any further PT services No Skilled PT: All education completed PT Recommendation Follow Up Recommendations: No PT follow up Equipment Recommended: Other (comment);None recommended by PT (crutches if pain in foot returns. ) PT Goals  Acute Rehab PT Goals PT Goal Formulation: With patient  PT Evaluation Precautions/Restrictions    Prior Functioning  Home Living Lives With: Friend(s) Receives Help From: Other (Comment) (Roommate) Type of Home: Apartment Home Layout: One level Home Access: Stairs to enter Entrance Stairs-Rails: Can reach both Entrance Stairs-Number of Steps: 12 Bathroom Shower/Tub: Forensic scientist: Standard Bathroom Accessibility: Yes How Accessible: Accessible via walker Home Adaptive Equipment: None Prior Function Level of Independence: Independent with basic ADLs;Independent with homemaking with ambulation;Independent with gait;Independent with transfers Able to Take Stairs?:  Reciprically Driving: Yes Vocation: Full time employment Leisure: Hobbies-yes (Comment) Comments: weightlifting Cognition Cognition Arousal/Alertness: Awake/alert Overall Cognitive Status: Appears within functional limits for tasks assessed Sensation/Coordination Sensation Light Touch: Appears Intact Stereognosis: Not tested Hot/Cold: Not tested Proprioception: Appears Intact Coordination Gross Motor Movements are Fluid and Coordinated: Yes Fine Motor Movements are Fluid and Coordinated: Yes Extremity Assessment RUE Assessment RUE Assessment: Within Functional Limits LUE Assessment LUE Assessment: Within Functional Limits RLE Assessment RLE Assessment: Within Functional Limits LLE Assessment LLE Assessment: Exceptions to WFL LLE AROM (degrees) Left Ankle Dorsiflexion 0-20: 12  Mobility (including Balance) Bed Mobility Bed Mobility: Yes Supine to Sit: 7: Independent;HOB flat Sit to Supine: 7: Independent;HOB flat Transfers Transfers: Yes Sit to Stand: 7: Independent;From bed;With upper extremity assist Stand to Sit: 7: Independent;To bed Ambulation/Gait Ambulation/Gait: Yes Ambulation/Gait Assistance: 7: Independent;6: Modified independent (Device/Increase time) Ambulation Distance (Feet): 200 Feet Assistive device: None;Crutches (100 feet with single crutch 100 with no AD) Gait Pattern: Decreased stance time - left;Step-to pattern;Decreased weight shift to left Stairs: Yes Stairs Assistance: 7: Independent Stair Management Technique: One rail Right Number of Stairs: 12  Height of Stairs: 6   Posture/Postural Control Posture/Postural Control: No significant limitations Balance Balance Assessed: No Exercise  General Exercises - Lower Extremity Ankle Circles/Pumps: AROM;10 reps;Supine;Seated Short Arc Quad: 10 reps;Both;Supine Long Arc Quad: 10 reps;Seated;Both;Strengthening Heel Slides: Both;AROM;Sidelying;Seated Hip ABduction/ADduction: 10  reps;AROM;Strengthening;Both End of Session PT - End of Session Equipment Utilized During Treatment: Gait belt Activity Tolerance: Patient tolerated treatment well Patient left: in bed Nurse Communication: Mobility status for transfers;Mobility status for ambulation General Behavior During Session: [facility] for tasks performed Cognition: [facility] for tasks performed  Chantil Bari 11/01/2011, 1:18 PM Zayda Angell L. Chilton Si DPT  [phone]  

## 2011-11-02 ENCOUNTER — Inpatient Hospital Stay (HOSPITAL_COMMUNITY): Payer: Medicaid Other

## 2011-11-02 LAB — CBC
HCT: 18.6 % — ABNORMAL LOW (ref 39.0–52.0)
Hemoglobin: 7 g/dL — ABNORMAL LOW (ref 13.0–17.0)
RBC: 2.21 MIL/uL — ABNORMAL LOW (ref 4.22–5.81)
WBC: 7.8 10*3/uL (ref 4.0–10.5)

## 2011-11-02 LAB — URINE CULTURE

## 2011-11-02 MED ORDER — CIPROFLOXACIN HCL 0.3 % OP SOLN
1.0000 [drp] | OPHTHALMIC | Status: DC
  Administered 2011-11-02 – 2011-11-05 (×16): 1 [drp] via OPHTHALMIC
  Filled 2011-11-02 (×2): qty 2.5

## 2011-11-02 MED ORDER — WHITE PETROLATUM GEL
Status: AC
  Administered 2011-11-02: 03:00:00
  Filled 2011-11-02: qty 5

## 2011-11-02 NOTE — Progress Notes (Signed)
11/02/11 1400 Pt being transferred to [facility] Rm  1316. Alon Mazor,RN.

## 2011-11-02 NOTE — Progress Notes (Signed)
Patient seen and examined.  agree with assessment and plan outlined above.

## 2011-11-02 NOTE — Progress Notes (Signed)
Patient ID: Philip Rios, male   DOB: [birth year], 26 y.o.   MRN: [id number]  PATIENT DETAILS Name: Philip Rios Age: 26 y.o. Sex: male Date of Birth: 10/09/1985 Admit Date: 10/30/2011 WUJ:[id number],WGNFA A, MD, MD POA:   CONSULTS: Dr Willey Blade.  Subjective: Left Ankle pain improved on PCA pump.  Patient still unable to walk.  Objective: Vital signs in last 24 hours: Temp:  [98 F (36.7 C)-98.2 F (36.8 C)] 98.1 F (36.7 C) (02/01 0516) Pulse Rate:  [67-78] 67  (02/01 0516) Resp:  [16-18] 18  (02/01 0816) BP: (106-128)/(64-69) 114/69 mmHg (02/01 0516) SpO2:  [95 %-100 %] 100 % (02/01 0816) FiO2 (%):  [27 %] 27 % (02/01 0816) Weight change:  Last BM Date: 10/31/11  Intake/Output from previous day:  Intake/Output Summary (Last 24 hours) at 11/02/11 1040 Last data filed at 11/02/11 0900  Gross per 24 hour  Intake    472 ml  Output   3745 ml  Net  -3273 ml     Physical Exam:  Gen:  Awake, alert in NAD, Icterus 2+ Cardiovascular:  S1S2 RRR, no m/r/g Respiratory: CTAB, no w/r/c, no increased wob Gastrointestinal: abdomen soft, NT/ND, BS+ Extremities: very mild swelling to left lateral ankle.    Lab Results:  Lab 11/02/11 0532 11/01/11 0515 10/31/11 0520  HGB 7.0* 7.4* 6.2*  HCT 18.6* 20.3* 17.2*  WBC 7.8 8.7 10.7*  PLT 287 293 267     Lab 10/31/11 0520 10/30/11 2054 10/28/11 0409  NA 138 135 142  K 4.1 3.9 --  CL 103 99 107  CO2 26 25 --  GLUCOSE 94 90 103*  BUN 9 8 8   CREATININE 0.69 0.75 0.50  CALCIUM 8.7 9.3 --  MG -- -- --  PHOS -- -- --    Studies/Results: Ct Head Wo Contrast  11/02/2011  *RADIOLOGY REPORT*  Clinical Data: Right-sided headache.  Previous head trauma.  CT HEAD WITHOUT CONTRAST  Technique:  Contiguous axial images were obtained from the base of the skull through the vertex without contrast.  Comparison: 02/06/2005  Findings: The right hemisphere is small compared to the left suggesting hypoplasia or atrophy due to old ischemic  insult.  There is an old focal infarction in the right frontal region with cortical and subcortical encephalomalacia.  No sign of acute infarction, mass lesion, hemorrhage, hydrocephalus or extra-axial collection.  No inflammatory sinus disease. No detectable orbital abnormality.  IMPRESSION: No acute or reversible findings.  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  Original Report Authenticated By: Thomasenia Sales, M.D.   Mr Foot Left Wo Contrast  10/31/2011  *RADIOLOGY REPORT*  Clinical Data: Pain.  Sickle cell disease.  Question avascular necrosis.  MRI OF THE LEFT FOREFOOT WITHOUT CONTRAST  Technique:  Multiplanar, multisequence MR imaging was performed. No intravenous contrast was administered.  Comparison: None.  Findings: There are scattered areas of marrow signal abnormality, most prominent in the proximal aspect of the first metatarsal. Additional areas of marrow signal abnormality is seen in the distal talus, navicular, cuboid, proximal second metatarsal and medial cuneiform.  No fracture is identified.  No fluid collection or mass is seen.  Intrinsic musculature appears normal.  IMPRESSION: Multi focal areas of marrow signal abnormality most prominent in the proximal first metatarsal.  Findings could be due to the presence of active marrow, infarct or possibly osteomyelitis.  Original Report Authenticated By: Bernadene Bell. Maricela Curet, M.D.   Mr Ankle Left  Wo Contrast  10/31/2011  *RADIOLOGY REPORT*  Clinical Data: Severe pain.  History of sickle cell disease.  MRI OF THE LEFT ANKLE WITHOUT CONTRAST  Technique:  Multiplanar, multisequence MR imaging was performed. No intravenous contrast was administered.  Comparison: None.  Findings: There is multi focal decreased T1 marrow signal, particularly in the distal tibia.  The patient has an ankle joint effusion.   There is subcutaneous edema about the ankle with mild muscular edema also seen scattered in intrinsic musculature of the foot.  No focal fluid collection is identified.  Tendons appear intact.  No ligament tear is identified.  IMPRESSION: Multi focal marrow signal abnormality appearing most intense in the distal tibia is nonspecific.  Findings could be due to bone infarcts, the presence of active marrow or possibly osteomyelitis.  Original Report Authenticated By: Bernadene Bell. Maricela Curet, M.D.    Medications: Scheduled Meds:    . cefTRIAXone (ROCEPHIN)  IV  1 g Intravenous Q24H  . ciprofloxacin  1 drop Both Eyes Q4H while awake  . folic acid  1 mg Oral Daily  . HYDROmorphone PCA 0.3 mg/mL   Intravenous Q4H  . hydroxyurea  1,000 mg Oral Q48H  . hydroxyurea  1,500 mg Oral Q48H  . mulitivitamin with minerals  1 tablet Oral Daily  . thiamine  100 mg Oral Daily  . white petrolatum       Continuous Infusions:    . dextrose 5 % and 0.2 % NaCl 100 mL/hr at 11/02/11 0242  . DISCONTD: sodium chloride 100 mL/hr at 11/01/11 1717   PRN Meds:.acetaminophen, acetaminophen, diphenhydrAMINE, diphenhydrAMINE, diphenhydrAMINE, HYDROcodone-acetaminophen, HYDROmorphone, LORazepam, LORazepam, naloxone, naphazoline, ondansetron (ZOFRAN) IV, ondansetron (ZOFRAN) IV, ondansetron, sodium chloride Antibiotics: Anti-infectives     Start     Dose/Rate Route Frequency Ordered Stop   10/31/11 1200   cefTRIAXone (ROCEPHIN) 1 g in dextrose 5 % 50 mL IVPB        1 g 100 mL/hr over 30 Minutes Intravenous Every 24 hours 10/31/11 1120             Assessment/Plan:  1. Left ankle pain with Sickle Cell Dz -Sickle cell crisis. Patient with hemoglobin SS disease with usual hemoglobin range between 7 and 8. Patient with severe crisis precipitated by excessive physical activity.  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 Increased risk for avascular necrosis. Clinically not consistent with osteomyelitis.     Dr. August Saucer  has recommended a PICC for exchange transfusion, and Dilaudid PCA for pain control.  We appreciate  Dr. Diamantina Providence counsel and will transfer the patient to the Sickle Cell Service at [facility] under Dr. August Saucer as attending physician.    2. UTI - on Rocephin (d3 is 2/1) cultures pending.  Likely change to oral cipro based on cultures.   3. Anemia of chronic dz - Patient has received 1 unit of PRBCs on 1/30.  Hgb stable at 7.0  4. Polysubstance abuse - THC/etoh. Pt counseled. No signs of withdrawal.  5.  Right Eye Pain - improved on eye drops.  Baseline CT ordered per Dr. August Saucer.  Disposition:  Transferring to Dr. Diamantina Providence Service at [facility].  We will sign off.  Please re-consult if needed.    LOS: 3 days  Algis Downs, [place] - [facility] [phone]  11/02/2011, 10:40 AM

## 2011-11-02 NOTE — Progress Notes (Signed)
Subjective:  Patient transferred to [facility] hematology for further treatment of  his sickle cell crisis. His pain is much improved with his PCA Dilaudid pump. He notes that his headaches are not as severe as well. His right eye pain did respond to the Ciloxan eyedrops. He is presently rating his pain as a 5/10 tonight. Patient denies chest pains or abdominal pain. Continues to have pain in his left ankle but decreased.   Allergies  Allergen Reactions  . Codeine Hives   Current Facility-Administered Medications  Medication Dose Route Frequency Provider Last Rate Last Dose  . acetaminophen (TYLENOL) tablet 650 mg  650 mg Oral Q6H PRN Maren Reamer, NP   650 mg at 10/31/11 2254   Or  . acetaminophen (TYLENOL) suppository 650 mg  650 mg Rectal Q6H PRN Maren Reamer, NP      . cefTRIAXone (ROCEPHIN) 1 g in dextrose 5 % 50 mL IVPB  1 g Intravenous Q24H Stephani Police, PA   1 g at 11/02/11 1249  . ciprofloxacin (CILOXAN) 0.3 % ophthalmic solution 1 drop  1 drop Both Eyes Q4H while awake Willey Blade, MD   1 drop at 11/02/11 1352  . dextrose 5 % and 0.2 % NaCl infusion   Intravenous Continuous Willey Blade, MD 100 mL/hr at 11/02/11 1349    . diphenhydrAMINE (BENADRYL) injection 12.5 mg  12.5 mg Intravenous Q6H PRN Willey Blade, MD       Or  . diphenhydrAMINE (BENADRYL) 12.5 MG/5ML elixir 12.5 mg  12.5 mg Oral Q6H PRN Willey Blade, MD      . diphenhydrAMINE (BENADRYL) capsule 25 mg  25 mg Oral Q6H PRN Stephani Police, PA   25 mg at 10/31/11 2254  . folic acid (FOLVITE) tablet 1 mg  1 mg Oral Daily Marcellus Delano, MD   1 mg at 11/02/11 1031  . HYDROcodone-acetaminophen (NORCO) 5-325 MG per tablet 1-2 tablet  1-2 tablet Oral Q6H PRN Eduard Clos, MD   1 tablet at 11/01/11 1535  . HYDROmorphone (DILAUDID) PCA injection 0.3 mg/mL   Intravenous Q4H Willey Blade, MD   2.41 mL at 11/02/11 2029  . hydroxyurea (HYDREA) capsule 1,000 mg  1,000 mg Oral Q48H Hilario Quarry Amend, PHARMD   1,000 mg at 11/01/11 0857  .  hydroxyurea (HYDREA) capsule 1,500 mg  1,500 mg Oral Q48H Eduard Clos, MD   1,500 mg at 11/02/11 1610  . LORazepam (ATIVAN) tablet 1 mg  1 mg Oral Q6H PRN Eduard Clos, MD       Or  . LORazepam (ATIVAN) injection 1 mg  1 mg Intravenous Q6H PRN Eduard Clos, MD      . mulitivitamin with minerals tablet 1 tablet  1 tablet Oral Daily Eduard Clos, MD   1 tablet at 11/02/11 1031  . naloxone [facility]) injection 0.4 mg  0.4 mg Intravenous PRN Willey Blade, MD       And  . sodium chloride 0.9 % injection 9 mL  9 mL Intravenous PRN Willey Blade, MD      . naphazoline (NAPHCON) 0.1 % ophthalmic solution 1 drop  1 drop Both Eyes QID PRN Stephani Police, PA   1 drop at 10/31/11 1538  . ondansetron (ZOFRAN) tablet 4 mg  4 mg Oral Q6H PRN Eduard Clos, MD       Or  . ondansetron [facility]) injection 4 mg  4 mg Intravenous Q6H PRN Eduard Clos, MD      .  ondansetron (ZOFRAN) injection 4 mg  4 mg Intravenous Q6H PRN Willey Blade, MD      . thiamine (VITAMIN B-1) tablet 100 mg  100 mg Oral Daily Eduard Clos, MD   100 mg at 11/02/11 1031  . white petrolatum (VASELINE) gel           . DISCONTD: 0.9 %  sodium chloride infusion   Intravenous Continuous Stephani Police, PA 100 mL/hr at 11/01/11 1717    . DISCONTD: HYDROmorphone (DILAUDID) injection 2 mg  2 mg Intravenous Q2H PRN Stephani Police, PA   2 mg at 11/01/11 2142    Objective: Blood pressure 119/73, pulse 75, temperature 98.8 F (37.1 C), temperature source Oral, resp. rate 16, height 5\' 11"  (1.803 m), weight 135 lb 5.8 oz (61.4 kg), SpO2 100.00%.  Well-developed well-nourished black male in no acute distress. HEENT: No sinus tenderness. Bowel sclera icterus. NECK: No posterior cervical nodes. LUNGS: Clear to auscultation. No vocal fremitus. No CVA tenderness. CV: Normal S1, S2 without S3. Intermittent midsystolic click noted. ABD: Soft nontender. MSK: Decreased swelling in the left ankle. Negative  Homans bilaterally. NEURO: Intact.  Lab results: Results for orders placed during the hospital encounter of 10/30/11 (from the past 48 hour(s))  PREPARE RBC (CROSSMATCH)     Status: Normal   Collection Time   10/31/11 10:00 PM      Component Value Range Comment   Order Confirmation ORDER PROCESSED BY BLOOD BANK     CBC     Status: Abnormal   Collection Time   11/01/11  5:15 AM      Component Value Range Comment   WBC 8.7  4.0 - 10.5 (K/uL)    RBC 2.35 (*) 4.22 - 5.81 (MIL/uL)    Hemoglobin 7.4 (*) 13.0 - 17.0 (g/dL)    HCT 40.9 (*) 81.1 - 52.0 (%)    MCV 86.4  78.0 - 100.0 (fL)    MCH 31.5  26.0 - 34.0 (pg)    MCHC 36.5 (*) 30.0 - 36.0 (g/dL)    RDW 91.4 (*) 78.2 - 15.5 (%)    Platelets 293  150 - 400 (K/uL) PLATELET COUNT CONFIRMED BY SMEAR  URINE CULTURE     Status: Normal   Collection Time   11/01/11  1:27 PM      Component Value Range Comment   Specimen Description URINE, RANDOM      Special Requests PATIENT ON FOLLOWING ROCEPHIN      Culture  Setup Time [id number]      Colony Count NO GROWTH      Culture NO GROWTH      Report Status 11/02/2011 FINAL     CBC     Status: Abnormal   Collection Time   11/02/11  5:32 AM      Component Value Range Comment   WBC 7.8  4.0 - 10.5 (K/uL) ADJUSTED FOR NUCLEATED RBC'S   RBC 2.21 (*) 4.22 - 5.81 (MIL/uL)    Hemoglobin 7.0 (*) 13.0 - 17.0 (g/dL)    HCT 46.9 (*) 62.9 - 52.0 (%)    MCV 84.2  78.0 - 100.0 (fL)    MCH 31.7  26.0 - 34.0 (pg)    MCHC 37.6 (*) 30.0 - 36.0 (g/dL)    RDW 52.8 (*) 41.3 - 15.5 (%)    Platelets 287  150 - 400 (K/uL) PLATELET COUNT CONFIRMED BY SMEAR    Studies/Results: Ct Head Wo Contrast  11/02/2011  *RADIOLOGY REPORT*  Clinical Data:  Right-sided headache.  Previous head trauma.  CT HEAD WITHOUT CONTRAST  Technique:  Contiguous axial images were obtained from the base of the skull through the vertex without contrast.  Comparison: 02/06/2005  Findings: The right hemisphere is small compared to the left  suggesting hypoplasia or atrophy due to old ischemic insult.  There is an old focal infarction in the right frontal region with cortical and subcortical encephalomalacia.  No sign of acute infarction, mass lesion, hemorrhage, hydrocephalus or extra-axial collection.  No inflammatory sinus disease. No detectable orbital abnormality.  IMPRESSION: No acute or reversible findings.  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  Original Report Authenticated By: Thomasenia Sales, M.D.    Patient Active Problem List  Diagnoses  . Left foot pain  . Hypoxia  . Sickle cell disease    Impression: Sickle cell crisis, slowly improving. 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 Cerebrovascular disease secondary to #1. Patient with history of previous CVA as a child. He did receive monthly transfusions up until age 62. Mild right eye conjunctivitis presently improved. UTI. Rule out. Culture negative.   Plan: Continue his present therapy. Continue present dose of PCA pump. Followup CMET, CBC with differential, ferritin level in a.m. We'll consider exchange transfusion pending  progress of the above.   August Saucer, Jamilett Ferrante 11/02/2011 9:06 PM

## 2011-11-03 LAB — COMPREHENSIVE METABOLIC PANEL
ALT: 23 U/L (ref 0–53)
Alkaline Phosphatase: 78 U/L (ref 39–117)
BUN: 8 mg/dL (ref 6–23)
CO2: 26 mEq/L (ref 19–32)
GFR calc Af Amer: >90 mL/min (ref >90)
GFR calc non Af Amer: >90 mL/min (ref >90)
Glucose, Bld: 91 mg/dL (ref 70–99)
Potassium: 4.3 mEq/L (ref 3.5–5.1)
Total Protein: 8.7 g/dL — ABNORMAL HIGH (ref 6.0–8.3)

## 2011-11-03 LAB — DIFFERENTIAL
Basophils Absolute: 0 10*3/uL (ref 0.0–0.1)
Lymphs Abs: 1.4 10*3/uL (ref 0.7–4.0)
Monocytes Absolute: 0.7 10*3/uL (ref 0.1–1.0)
Neutro Abs: 6.2 10*3/uL (ref 1.7–7.7)

## 2011-11-03 LAB — CBC
HCT: 21.8 % — ABNORMAL LOW (ref 39.0–52.0)
MCH: 32.6 pg (ref 26.0–34.0)
MCV: 84.5 fL (ref 78.0–100.0)
Platelets: 338 10*3/uL (ref 150–400)
RDW: 23.7 % — ABNORMAL HIGH (ref 11.5–15.5)

## 2011-11-03 NOTE — Progress Notes (Signed)
Subjective:  Patient reports he's feeling better today. He denies significant headaches. There's no chest pain or shortness of breath. No further discomfort. His left ankle is feeling better. He was able to ambulate with much reduced pain. He is presently using his PCA Dilaudid pump very sparingly at this time. No other new complaints.   Allergies  Allergen Reactions  . Codeine Hives   Current Facility-Administered Medications  Medication Dose Route Frequency Provider Last Rate Last Dose  . acetaminophen (TYLENOL) tablet 650 mg  650 mg Oral Q6H PRN Maren Reamer, NP   650 mg at 10/31/11 2254   Or  . acetaminophen (TYLENOL) suppository 650 mg  650 mg Rectal Q6H PRN Maren Reamer, NP      . cefTRIAXone (ROCEPHIN) 1 g in dextrose 5 % 50 mL IVPB  1 g Intravenous Q24H Stephani Police, PA   1 g at 11/02/11 1249  . ciprofloxacin (CILOXAN) 0.3 % ophthalmic solution 1 drop  1 drop Both Eyes Q4H while awake Willey Blade, MD   1 drop at 11/03/11 1037  . dextrose 5 % and 0.2 % NaCl infusion   Intravenous Continuous Willey Blade, MD 100 mL/hr at 11/03/11 [phone]    . diphenhydrAMINE (BENADRYL) injection 12.5 mg  12.5 mg Intravenous Q6H PRN Willey Blade, MD       Or  . diphenhydrAMINE (BENADRYL) 12.5 MG/5ML elixir 12.5 mg  12.5 mg Oral Q6H PRN Willey Blade, MD      . diphenhydrAMINE (BENADRYL) capsule 25 mg  25 mg Oral Q6H PRN Stephani Police, PA   25 mg at 10/31/11 2254  . folic acid (FOLVITE) tablet 1 mg  1 mg Oral Daily Marcellus Fridman, MD   1 mg at 11/03/11 1006  . HYDROcodone-acetaminophen (NORCO) 5-325 MG per tablet 1-2 tablet  1-2 tablet Oral Q6H PRN Eduard Clos, MD   1 tablet at 11/01/11 1535  . HYDROmorphone (DILAUDID) PCA injection 0.3 mg/mL   Intravenous Q4H Willey Blade, MD   3 mg at 11/03/11 0527  . hydroxyurea (HYDREA) capsule 1,000 mg  1,000 mg Oral Q48H Hilario Quarry [place], PHARMD   1,000 mg at 11/03/11 0748  . hydroxyurea (HYDREA) capsule 1,500 mg  1,500 mg Oral Q48H Eduard Clos, MD   1,500 mg  at 11/02/11 6578  . LORazepam (ATIVAN) tablet 1 mg  1 mg Oral Q6H PRN Eduard Clos, MD       Or  . LORazepam (ATIVAN) injection 1 mg  1 mg Intravenous Q6H PRN Eduard Clos, MD      . mulitivitamin with minerals tablet 1 tablet  1 tablet Oral Daily Eduard Clos, MD   1 tablet at 11/03/11 1006  . naloxone [facility]) injection 0.4 mg  0.4 mg Intravenous PRN Willey Blade, MD       And  . sodium chloride 0.9 % injection 9 mL  9 mL Intravenous PRN Willey Blade, MD      . naphazoline (NAPHCON) 0.1 % ophthalmic solution 1 drop  1 drop Both Eyes QID PRN Stephani Police, PA   1 drop at 10/31/11 1538  . ondansetron (ZOFRAN) tablet 4 mg  4 mg Oral Q6H PRN Eduard Clos, MD       Or  . ondansetron [facility]) injection 4 mg  4 mg Intravenous Q6H PRN Eduard Clos, MD      . ondansetron [facility]) injection 4 mg  4 mg Intravenous Q6H PRN Willey Blade, MD      .  thiamine (VITAMIN B-1) tablet 100 mg  100 mg Oral Daily Eduard Clos, MD   100 mg at 11/03/11 1006  . DISCONTD: HYDROmorphone (DILAUDID) injection 2 mg  2 mg Intravenous Q2H PRN Stephani Police, PA   2 mg at 11/01/11 2142    Objective: Blood pressure 114/60, pulse 70, temperature 98 F (36.7 C), temperature source Oral, resp. rate 18, height 5\' 11"  (1.803 m), weight 128 lb 14.4 oz (58.469 kg), SpO2 99.00%.  Well-developed well-nourished black male presently in no acute distress. HEENT: No sinus tenderness. Positive sclera icterus. NECK: No posterior cervical nodes. LUNGS: Clear to auscultation. No vocal fremitus. CV: Normal S1, S2 without S3. Intermittent midsystolic click noted. ABD: No tenderness. MSK: Decreased tenderness and swelling in the left ankle. NEURO: Intact.  Lab results: Results for orders placed during the hospital encounter of 10/30/11 (from the past 48 hour(s))  URINE CULTURE     Status: Normal   Collection Time   11/01/11  1:27 PM      Component Value Range Comment   Specimen Description  URINE, RANDOM      Special Requests PATIENT ON FOLLOWING ROCEPHIN      Culture  Setup Time [id number]      Colony Count NO GROWTH      Culture NO GROWTH      Report Status 11/02/2011 FINAL     CBC     Status: Abnormal   Collection Time   11/02/11  5:32 AM      Component Value Range Comment   WBC 7.8  4.0 - 10.5 (K/uL) ADJUSTED FOR NUCLEATED RBC'S   RBC 2.21 (*) 4.22 - 5.81 (MIL/uL)    Hemoglobin 7.0 (*) 13.0 - 17.0 (g/dL)    HCT 81.1 (*) 91.4 - 52.0 (%)    MCV 84.2  78.0 - 100.0 (fL)    MCH 31.7  26.0 - 34.0 (pg)    MCHC 37.6 (*) 30.0 - 36.0 (g/dL)    RDW 78.2 (*) 95.6 - 15.5 (%)    Platelets 287  150 - 400 (K/uL) PLATELET COUNT CONFIRMED BY SMEAR  CBC     Status: Abnormal   Collection Time   11/03/11  6:25 AM      Component Value Range Comment   WBC 8.7  4.0 - 10.5 (K/uL)    RBC 2.58 (*) 4.22 - 5.81 (MIL/uL)    Hemoglobin 8.4 (*) 13.0 - 17.0 (g/dL)    HCT 21.3 (*) 08.6 - 52.0 (%)    MCV 84.5  78.0 - 100.0 (fL)    MCH 32.6  26.0 - 34.0 (pg)    MCHC 38.5 (*) 30.0 - 36.0 (g/dL) SICKLE CELLS   RDW 57.8 (*) 11.5 - 15.5 (%)    Platelets 338  150 - 400 (K/uL)   DIFFERENTIAL     Status: Normal   Collection Time   11/03/11  6:25 AM      Component Value Range Comment   Neutrophils Relative 71  43 - 77 (%)    Lymphocytes Relative 16  12 - 46 (%)    Monocytes Relative 8  3 - 12 (%)    Eosinophils Relative 5  0 - 5 (%)    Basophils Relative 0  0 - 1 (%)    Neutro Abs 6.2  1.7 - 7.7 (K/uL)    Lymphs Abs 1.4  0.7 - 4.0 (K/uL)    Monocytes Absolute 0.7  0.1 - 1.0 (K/uL)    Eosinophils Absolute 0.4  0.0 - 0.7 (K/uL)    Basophils Absolute 0.0  0.0 - 0.1 (K/uL)    RBC Morphology SICKLE CELLS      WBC Morphology WHITE COUNT CONFIRMED ON SMEAR      Smear Review LARGE PLATELETS PRESENT   PLATELET COUNT CONFIRMED BY SMEAR  COMPREHENSIVE METABOLIC PANEL     Status: Abnormal   Collection Time   11/03/11  6:25 AM      Component Value Range Comment   Sodium 130 (*) 135 - 145 (mEq/L)     Potassium 4.3  3.5 - 5.1 (mEq/L)    Chloride 94 (*) 96 - 112 (mEq/L)    CO2 26  19 - 32 (mEq/L)    Glucose, Bld 91  70 - 99 (mg/dL)    BUN 8  6 - 23 (mg/dL)    Creatinine, Ser 3.87  0.50 - 1.35 (mg/dL)    Calcium 9.7  8.4 - 10.5 (mg/dL)    Total Protein 8.7 (*) 6.0 - 8.3 (g/dL)    Albumin 3.8  3.5 - 5.2 (g/dL)    AST 53 (*) 0 - 37 (U/L)    ALT 23  0 - 53 (U/L)    Alkaline Phosphatase 78  39 - 117 (U/L)    Total Bilirubin 8.2 (*) 0.3 - 1.2 (mg/dL)    GFR calc non Af Amer >90  >90 (mL/min)    GFR calc Af Amer >90  >90 (mL/min)     Studies/Results: Ct Head Wo Contrast  11/02/2011  *RADIOLOGY REPORT*  Clinical Data: Right-sided headache.  Previous head trauma.  CT HEAD WITHOUT CONTRAST  Technique:  Contiguous axial images were obtained from the base of the skull through the vertex without contrast.  Comparison: 02/06/2005  Findings: The right hemisphere is small compared to the left suggesting hypoplasia or atrophy due to old ischemic insult.  There is an old focal infarction in the right frontal region with cortical and subcortical encephalomalacia.  No sign of acute infarction, mass lesion, hemorrhage, hydrocephalus or extra-axial collection.  No inflammatory sinus disease. No detectable orbital abnormality.  IMPRESSION: No acute or reversible findings.  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  Original Report Authenticated By: Thomasenia Sales, M.D.    Patient Active Problem List  Diagnoses  . Left foot pain  . Hypoxia  . Sickle cell disease    Impression: Sickle cell crisis resolving. 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 Mild right eye conjunctivitis improved. Severe active hemolysis gradually improving. Bilirubin has decreased 8.2.    Plan: Continue present therapy. Followup CBC in CMET in a.m. Discharge home soon  if his present transient continues.   August Saucer, Melinna Linarez 11/03/2011 12:22 PM

## 2011-11-04 LAB — COMPREHENSIVE METABOLIC PANEL
ALT: 35 U/L (ref 0–53)
AST: 72 U/L — ABNORMAL HIGH (ref 0–37)
Albumin: 3.6 g/dL (ref 3.5–5.2)
CO2: 27 mEq/L (ref 19–32)
Calcium: 9.4 mg/dL (ref 8.4–10.5)
Creatinine, Ser: 0.57 mg/dL (ref 0.50–1.35)
GFR calc non Af Amer: >90 mL/min (ref >90)
Sodium: 132 mEq/L — ABNORMAL LOW (ref 135–145)
Total Protein: 8.3 g/dL (ref 6.0–8.3)

## 2011-11-04 LAB — DIFFERENTIAL
Basophils Absolute: 0.1 10*3/uL (ref 0.0–0.1)
Eosinophils Absolute: 0.6 10*3/uL (ref 0.0–0.7)
Lymphocytes Relative: 25 % (ref 12–46)
Monocytes Relative: 15 % — ABNORMAL HIGH (ref 3–12)
Neutro Abs: 3.7 10*3/uL (ref 1.7–7.7)
Neutrophils Relative %: 51 % (ref 43–77)

## 2011-11-04 LAB — CBC
HCT: 19.9 % — ABNORMAL LOW (ref 39.0–52.0)
MCHC: 37.2 g/dL — ABNORMAL HIGH (ref 30.0–36.0)
Platelets: 306 10*3/uL (ref 150–400)
RDW: 22.3 % — ABNORMAL HIGH (ref 11.5–15.5)
WBC: 7.4 10*3/uL (ref 4.0–10.5)

## 2011-11-04 NOTE — Progress Notes (Signed)
Subjective:  Patient reports he's feeling better. He notes the pain in his foot is decreasing. He is beginning to ambulate in his room. He denies chest pains or shortness of breath. He notes that his job is strenuous at times. He is on his feet quite a bit. We discussed the necessity of him resting his foot due to probable bone infarct. No other new complaints.    Allergies  Allergen Reactions  . Codeine Hives   Current Facility-Administered Medications  Medication Dose Route Frequency Provider Last Rate Last Dose  . acetaminophen (TYLENOL) tablet 650 mg  650 mg Oral Q6H PRN Maren Reamer, NP   650 mg at 10/31/11 2254   Or  . acetaminophen (TYLENOL) suppository 650 mg  650 mg Rectal Q6H PRN Maren Reamer, NP      . cefTRIAXone (ROCEPHIN) 1 g in dextrose 5 % 50 mL IVPB  1 g Intravenous Q24H Stephani Police, PA   1 g at 11/04/11 1102  . ciprofloxacin (CILOXAN) 0.3 % ophthalmic solution 1 drop  1 drop Both Eyes Q4H while awake Willey Blade, MD   1 drop at 11/04/11 1748  . dextrose 5 % and 0.2 % NaCl infusion   Intravenous Continuous Willey Blade, MD 100 mL/hr at 11/03/11 2005    . diphenhydrAMINE (BENADRYL) injection 12.5 mg  12.5 mg Intravenous Q6H PRN Willey Blade, MD       Or  . diphenhydrAMINE (BENADRYL) 12.5 MG/5ML elixir 12.5 mg  12.5 mg Oral Q6H PRN Willey Blade, MD      . diphenhydrAMINE (BENADRYL) capsule 25 mg  25 mg Oral Q6H PRN Stephani Police, PA   25 mg at 10/31/11 2254  . folic acid (FOLVITE) tablet 1 mg  1 mg Oral Daily Marcellus Kaeding, MD   1 mg at 11/04/11 1004  . HYDROcodone-acetaminophen (NORCO) 5-325 MG per tablet 1-2 tablet  1-2 tablet Oral Q6H PRN Eduard Clos, MD   1 tablet at 11/01/11 1535  . HYDROmorphone (DILAUDID) PCA injection 0.3 mg/mL   Intravenous Q4H Willey Blade, MD   0.4 mg at 11/04/11 2000  . hydroxyurea (HYDREA) capsule 1,000 mg  1,000 mg Oral Q48H Hilario Quarry [place], PHARMD   1,000 mg at 11/03/11 0748  . hydroxyurea (HYDREA) capsule 1,500 mg  1,500 mg Oral Q48H Eduard Clos, MD   1,500 mg at 11/04/11 0815  . mulitivitamin with minerals tablet 1 tablet  1 tablet Oral Daily Eduard Clos, MD   1 tablet at 11/04/11 1004  . naloxone [facility]) injection 0.4 mg  0.4 mg Intravenous PRN Willey Blade, MD       And  . sodium chloride 0.9 % injection 9 mL  9 mL Intravenous PRN Willey Blade, MD      . naphazoline (NAPHCON) 0.1 % ophthalmic solution 1 drop  1 drop Both Eyes QID PRN Stephani Police, PA   1 drop at 10/31/11 1538  . ondansetron (ZOFRAN) tablet 4 mg  4 mg Oral Q6H PRN Eduard Clos, MD       Or  . ondansetron [facility]) injection 4 mg  4 mg Intravenous Q6H PRN Eduard Clos, MD      . ondansetron [facility]) injection 4 mg  4 mg Intravenous Q6H PRN Willey Blade, MD      . thiamine (VITAMIN B-1) tablet 100 mg  100 mg Oral Daily Eduard Clos, MD   100 mg at 11/04/11 1004    Objective: Blood pressure 109/58, pulse  77, temperature 98.9 F (37.2 C), temperature source Oral, resp. rate 18, height 5\' 11"  (1.803 m), weight 128 lb 1.4 oz (58.1 kg), SpO2 100.00%.  Well-developed slender black male in no acute distress. HEENT:decreased clear of icterus. No sinus tenderness. NECK:no posterior cervical nodes. LUNGS:clear to auscultation. No vocal fremitus. ZO:XWRUEA S1, S2 without S3. ABD:no tenderness. VWU:JWJXBJYN Homans bilaterally. Decreased tenderness in the left ankle and foot. NEURO:intact.  Lab results: Results for orders placed during the hospital encounter of 10/30/11 (from the past 48 hour(s))  CBC     Status: Abnormal   Collection Time   11/03/11  6:25 AM      Component Value Range Comment   WBC 8.7  4.0 - 10.5 (K/uL)    RBC 2.58 (*) 4.22 - 5.81 (MIL/uL)    Hemoglobin 8.4 (*) 13.0 - 17.0 (g/dL)    HCT 82.9 (*) 56.2 - 52.0 (%)    MCV 84.5  78.0 - 100.0 (fL)    MCH 32.6  26.0 - 34.0 (pg)    MCHC 38.5 (*) 30.0 - 36.0 (g/dL) SICKLE CELLS   RDW 13.0 (*) 11.5 - 15.5 (%)    Platelets 338  150 - 400 (K/uL)   DIFFERENTIAL      Status: Normal   Collection Time   11/03/11  6:25 AM      Component Value Range Comment   Neutrophils Relative 71  43 - 77 (%)    Lymphocytes Relative 16  12 - 46 (%)    Monocytes Relative 8  3 - 12 (%)    Eosinophils Relative 5  0 - 5 (%)    Basophils Relative 0  0 - 1 (%)    Neutro Abs 6.2  1.7 - 7.7 (K/uL)    Lymphs Abs 1.4  0.7 - 4.0 (K/uL)    Monocytes Absolute 0.7  0.1 - 1.0 (K/uL)    Eosinophils Absolute 0.4  0.0 - 0.7 (K/uL)    Basophils Absolute 0.0  0.0 - 0.1 (K/uL)    RBC Morphology SICKLE CELLS      WBC Morphology WHITE COUNT CONFIRMED ON SMEAR      Smear Review LARGE PLATELETS PRESENT   PLATELET COUNT CONFIRMED BY SMEAR  COMPREHENSIVE METABOLIC PANEL     Status: Abnormal   Collection Time   11/03/11  6:25 AM      Component Value Range Comment   Sodium 130 (*) 135 - 145 (mEq/L)    Potassium 4.3  3.5 - 5.1 (mEq/L)    Chloride 94 (*) 96 - 112 (mEq/L)    CO2 26  19 - 32 (mEq/L)    Glucose, Bld 91  70 - 99 (mg/dL)    BUN 8  6 - 23 (mg/dL)    Creatinine, Ser 8.65  0.50 - 1.35 (mg/dL)    Calcium 9.7  8.4 - 10.5 (mg/dL)    Total Protein 8.7 (*) 6.0 - 8.3 (g/dL)    Albumin 3.8  3.5 - 5.2 (g/dL)    AST 53 (*) 0 - 37 (U/L)    ALT 23  0 - 53 (U/L)    Alkaline Phosphatase 78  39 - 117 (U/L)    Total Bilirubin 8.2 (*) 0.3 - 1.2 (mg/dL)    GFR calc non Af Amer >90  >90 (mL/min)    GFR calc Af Amer >90  >90 (mL/min)   CBC     Status: Abnormal   Collection Time   11/04/11  4:11 AM  Component Value Range Comment   WBC 7.4  4.0 - 10.5 (K/uL)    RBC 2.36 (*) 4.22 - 5.81 (MIL/uL)    Hemoglobin 7.4 (*) 13.0 - 17.0 (g/dL) REPEATED TO VERIFY   HCT 19.9 (*) 39.0 - 52.0 (%)    MCV 84.3  78.0 - 100.0 (fL)    MCH 31.4  26.0 - 34.0 (pg)    MCHC 37.2 (*) 30.0 - 36.0 (g/dL)    RDW 40.9 (*) 81.1 - 15.5 (%)    Platelets 306  150 - 400 (K/uL)   DIFFERENTIAL     Status: Abnormal   Collection Time   11/04/11  4:11 AM      Component Value Range Comment   Neutrophils Relative 51  43 - 77 (%)     Lymphocytes Relative 25  12 - 46 (%)    Monocytes Relative 15 (*) 3 - 12 (%)    Eosinophils Relative 8 (*) 0 - 5 (%)    Basophils Relative 1  0 - 1 (%)    Neutro Abs 3.7  1.7 - 7.7 (K/uL)    Lymphs Abs 1.9  0.7 - 4.0 (K/uL)    Monocytes Absolute 1.1 (*) 0.1 - 1.0 (K/uL)    Eosinophils Absolute 0.6  0.0 - 0.7 (K/uL)    Basophils Absolute 0.1  0.0 - 0.1 (K/uL)    RBC Morphology SICKLE CELLS      WBC Morphology VACUOLATED NEUTROPHILS      Smear Review LARGE PLATELETS PRESENT   PLATELET COUNT CONFIRMED BY SMEAR  COMPREHENSIVE METABOLIC PANEL     Status: Abnormal   Collection Time   11/04/11  4:11 AM      Component Value Range Comment   Sodium 132 (*) 135 - 145 (mEq/L)    Potassium 4.1  3.5 - 5.1 (mEq/L)    Chloride 97  96 - 112 (mEq/L)    CO2 27  19 - 32 (mEq/L)    Glucose, Bld 93  70 - 99 (mg/dL)    BUN 9  6 - 23 (mg/dL)    Creatinine, Ser 9.14  0.50 - 1.35 (mg/dL)    Calcium 9.4  8.4 - 10.5 (mg/dL)    Total Protein 8.3  6.0 - 8.3 (g/dL)    Albumin 3.6  3.5 - 5.2 (g/dL)    AST 72 (*) 0 - 37 (U/L)    ALT 35  0 - 53 (U/L)    Alkaline Phosphatase 80  39 - 117 (U/L)    Total Bilirubin 7.4 (*) 0.3 - 1.2 (mg/dL)    GFR calc non Af Amer >90  >90 (mL/min)    GFR calc Af Amer >90  >90 (mL/min)     Studies/Results: No results found.  Patient Active Problem List  Diagnoses  . Left foot pain  . Hypoxia  . Sickle cell disease    Impression: Resolving sickle cell crisis. Osteonecrosis involving the left ankle. Active hemolysis with gradual decrease. His bilirubin is trending down. Pain crisis resolving.   Plan: Continue his present supportive therapy. Followup his a CBC and CMET in a.m. As an exchange transfusion if bilirubin does not continue to trend downward. Home in 24 hours otherwise.   August Saucer, Chauncey Sciulli 11/04/2011 9:40 PM

## 2011-11-05 DIAGNOSIS — M879 Osteonecrosis, unspecified: Secondary | ICD-10-CM

## 2011-11-05 LAB — DIFFERENTIAL
Basophils Relative: 1 % (ref 0–1)
Eosinophils Relative: 7 % — ABNORMAL HIGH (ref 0–5)
Monocytes Absolute: 0.9 10*3/uL (ref 0.1–1.0)
Neutro Abs: 3.7 10*3/uL (ref 1.7–7.7)
Neutrophils Relative %: 53 % (ref 43–77)

## 2011-11-05 LAB — CBC
HCT: 19.7 % — ABNORMAL LOW (ref 39.0–52.0)
Hemoglobin: 7.2 g/dL — ABNORMAL LOW (ref 13.0–17.0)
MCH: 30.6 pg (ref 26.0–34.0)
MCHC: 36.5 g/dL — ABNORMAL HIGH (ref 30.0–36.0)
RBC: 2.35 MIL/uL — ABNORMAL LOW (ref 4.22–5.81)

## 2011-11-05 LAB — COMPREHENSIVE METABOLIC PANEL
AST: 81 U/L — ABNORMAL HIGH (ref 0–37)
Albumin: 3.5 g/dL (ref 3.5–5.2)
Alkaline Phosphatase: 76 U/L (ref 39–117)
BUN: 8 mg/dL (ref 6–23)
CO2: 25 mEq/L (ref 19–32)
Chloride: 95 mEq/L — ABNORMAL LOW (ref 96–112)
GFR calc non Af Amer: >90 mL/min (ref >90)
Potassium: 3.9 mEq/L (ref 3.5–5.1)
Total Bilirubin: 9 mg/dL — ABNORMAL HIGH (ref 0.3–1.2)

## 2011-11-05 MED ORDER — FOLIC ACID 1 MG PO TABS: 1.0000 mg | ORAL_TABLET | Freq: Every day | ORAL | Status: DC

## 2011-11-05 MED ORDER — HYDROXYUREA 500 MG PO CAPS: 1000.0000 mg | ORAL_CAPSULE | Freq: Every day | ORAL | Status: DC

## 2011-11-05 MED ORDER — HYDROCODONE-ACETAMINOPHEN 5-325 MG PO TABS: 1.0000 | ORAL_TABLET | Freq: Four times a day (QID) | ORAL | Status: DC | PRN

## 2011-11-05 MED ORDER — IBUPROFEN 800 MG PO TABS: 800.0000 mg | ORAL_TABLET | Freq: Three times a day (TID) | ORAL | Status: AC | PRN

## 2011-11-05 MED ORDER — LEVOFLOXACIN 750 MG PO TABS: 750.0000 mg | ORAL_TABLET | Freq: Every day | ORAL | Status: AC

## 2011-11-05 NOTE — Progress Notes (Signed)
Patient stable; Discharge instructions and scripts given to patient; Pt verbalizes understanding

## 2011-11-05 NOTE — Progress Notes (Signed)
Clinical Social Worker completed brief psychosocial assessment.  Assessment in Shadow chart.  Nikoli Nasser J Quintavius Niebuhr MSW, LCSW [phone]  

## 2011-11-05 NOTE — Discharge Summary (Signed)
Physician Discharge Summary  Patient ID: IVERSON SEES MRN: [id number] DOB/AGE: 1986-08-02 26 y.o.  Admit date: 10/30/2011 Discharge date: 11/05/2011  Admission Diagnoses: Left foot pain with sickle cell disease  Anemia  Mildly hypoxic Polysubstance abuse (including alcohol and marijuana)  Mild leukocytosis  Discharge Diagnoses:  Sickle Cell Crisis Osteonecrosis Hypoxia Right eye pain    Discharged Condition: Stable  Hospital Course:   Please refer to the detailed history and physical for information regarding this patient's admission.  In short, Mr. Durrell is a pleasant 26 year-old [place] American male with history of sickle cell disease that had been experiencing left foot pain over four days. The pain was present at rest and increased when pressure was applied.  The patient denied any other pain.  The patient was found mildly hypoxic in the ER which got better with 2 L of oxygen. The patient denied any chest pain, shortness of breath cough or phlegm.  Upon admission, the patient was referred to Dr. Diamantina Providence service for management of his sickle cell crisis and subsequently transferred to [facility] for further medical management.  The patient received aggressive pain management, IV hydration, a blood transfusion, antibitotic therapy and his home medications.  The patient was also assessed by the LCSW. The patient did not show any signs of substance withdrawal during his hospitalization.   During his hospitalization the patient declined physical therapy in addition to home physical therapy. The patient is now ambulating without assistance and without gait disturbance.  The patient rates his pain as a 1/10, states that he is ready to manage his pain at home and is ready for discharge.  The patient will be discharged to home now in stable condition.   Consults:  None  Significant Diagnostic Studies:  Dg Chest 2 View 10/30/2011  *RADIOLOGY REPORT*  Clinical Data: Shortness of breath,  smoker, sickle cell disease  CHEST - 2 VIEW  Comparison: 05/16/2011  Findings: Upper-normal size of cardiac silhouette. Mediastinal contours and pulmonary vascularity normal. Lungs clear. No pleural effusion or pneumothorax. No acute osseous findings.  IMPRESSION: No acute abnormalities.  Original Report Authenticated By: Lollie Marrow, M.D.   Dg Ankle Complete Left 10/30/2011  *RADIOLOGY REPORT*  Clinical Data: Sickle cell disease, left ankle pain for 5 days, no known injury  LEFT ANKLE COMPLETE - 3+ VIEW  Comparison: None  Findings: Ankle mortise intact. Osseous mineralization normal. No acute fracture, dislocation, or bone destruction  IMPRESSION: No acute osseous abnormalities.  Original Report Authenticated By: Lollie Marrow, M.D.   Ct Head Wo Contrast 11/02/2011  *RADIOLOGY REPORT*  Clinical Data: Right-sided headache.  Previous head trauma.  CT HEAD WITHOUT CONTRAST  Technique:  Contiguous axial images were obtained from the base of the skull through the vertex without contrast.  Comparison: 02/06/2005  Findings: The right hemisphere is small compared to the left suggesting hypoplasia or atrophy due to old ischemic insult.  There is an old focal infarction in the right frontal region with cortical and subcortical encephalomalacia.  No sign of acute infarction, mass lesion, hemorrhage, hydrocephalus or extra-axial collection.  No inflammatory sinus disease. No detectable orbital abnormality.  IMPRESSION: No acute or reversible findings.  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  Original Report Authenticated By: Thomasenia Sales, M.D.   Mr Foot Left Wo Contrast 10/31/2011  *RADIOLOGY REPORT*  Clinical Data: Pain.  Sickle cell disease.  Question avascular necrosis.  MRI OF THE LEFT FOREFOOT WITHOUT CONTRAST  Technique:  Multiplanar, multisequence MR imaging was  performed. No intravenous contrast was administered.  Comparison: None.  Findings: There are scattered areas of marrow signal abnormality, most prominent in the proximal aspect of the first metatarsal. Additional areas of marrow signal abnormality is seen in the distal talus, navicular, cuboid, proximal second metatarsal and medial cuneiform.  No fracture is identified.  No fluid collection or mass is seen.  Intrinsic musculature appears normal.  IMPRESSION: Multi focal areas of marrow signal abnormality most prominent in the proximal first metatarsal.  Findings could be due to the presence of active marrow, infarct or possibly osteomyelitis.  Original Report Authenticated By: Bernadene Bell. Maricela Curet, M.D.    Treatments:  IV hydration Antibiotics: Ceftriaxone Analgesia: Dilaudid Blood Transfusion  Discharge Exam: Blood pressure 113/54, pulse 74, temperature 98.3 F (36.8 C), temperature source Oral, resp. rate 18, height 5\' 11"  (1.803 m), weight 127 lb 9.6 oz (57.879 kg), SpO2 99.00%.  General Appearance: Alert, cooperative, well nourished, well developed, no apparent distress Head: Normocephalic, without obvious abnormality, atraumatic Eyes: PERRLA, EOMI, scleral icterus Nose: Nares, septum and mucosa are normal, no drainage or sinus tenderness Throat: Lips, mucosa, and tongue are normal; teeth and gums normal, the patient has a lip piercing Resp: CTA bilaterally, no wheezes/rales/rhonchi, diminished breath sounds LLL Cardio: Regular rate and rhythm, S1, S2 normal, no murmur, click, rub or gallop GI: Soft, non-tender, not distended, normal bowel sounds, no masses,  no organomegaly Male Genitalia: Deferred, patient denies priapism Extremities: Extremities normal, atraumatic, no cyanosis or edema, Homans sign is negative, no sign of DVT Pulses: 2+ and symmetric Skin: Skin color, texture and turgor are normal, no rashes or lesions,  several tattoos and piercings Neurologic: Alert and oriented X 3,  normal strength and tone, normal coordination and gait Psych:  Appropriate affect  Disposition: Home or Self Care  Discharge Orders    Future Orders Please Complete By Expires   Increase activity slowly      Discharge instructions      Comments:   Take all medications as prescribed Finish entire course of antibiotic therapy Use incentive spirometer daily Keep warm, keep well hydrated, get plenty of rest Please stay off of your feet as much as possible when they are swollen and/or painful   Call MD for:  severe uncontrolled pain        Medication List  As of 11/05/2011  1:11 PM   STOP taking these medications         oxyCODONE-acetaminophen 5-325 MG per tablet         TAKE these medications         folic acid 1 MG tablet   Commonly known as: FOLVITE   Take 1 tablet (1 mg total) by mouth daily.      HYDROcodone-acetaminophen 5-325 MG per tablet   Commonly known as: NORCO   Take 1-2 tablets by mouth every 6 (six) hours as needed. For pain      hydroxyurea 500 MG capsule   Commonly known as: HYDREA   Take 2-3 capsules (1,000-1,500 mg total) by mouth daily. Alternates between 2 and 3 tablets each day.      ibuprofen 800 MG tablet   Commonly known as: ADVIL,MOTRIN   Take 1 tablet (800 mg total) by mouth 3 (three) times daily with meals as needed for pain.      levofloxacin 750 MG tablet  Commonly known as: LEVAQUIN   Take 1 tablet (750 mg total) by mouth daily.           Follow-up Information    Follow up with AVBUERE,EDWIN A, MD. Schedule an appointment as soon as possible for a visit in 1 week. (Return to the ER if symptoms worsen)    Contact information:   [address] [place] [address] [phone]         The entire discharge summary, including medications and the importance of adherence to outpatient follow-up was discussed with the patient.  The patient stated he agreed and understood.  All of his questions were answered and his  concerns addressed.  Discharge Time:  Greater than 30 minutes  Signed: Larina Bras 11/05/2011, 1:04 PM

## 2012-01-04 ENCOUNTER — Emergency Department (HOSPITAL_COMMUNITY): Payer: Federal, State, Local not specified - PPO

## 2012-01-04 ENCOUNTER — Encounter (HOSPITAL_COMMUNITY): Payer: Self-pay | Admitting: Emergency Medicine

## 2012-01-04 ENCOUNTER — Observation Stay (HOSPITAL_COMMUNITY)
Admission: EM | Admit: 2012-01-04 | Discharge: 2012-01-04 | DRG: 395 | Disposition: A | Discharge status: Home / Self Care | Payer: Federal, State, Local not specified - PPO | Attending: Internal Medicine | Admitting: Internal Medicine

## 2012-01-04 DIAGNOSIS — F1290 Cannabis use, unspecified, uncomplicated: Secondary | ICD-10-CM | POA: Diagnosis present

## 2012-01-04 DIAGNOSIS — D72829 Elevated white blood cell count, unspecified: Secondary | ICD-10-CM | POA: Diagnosis present

## 2012-01-04 DIAGNOSIS — D5700 Hb-SS disease with crisis, unspecified: Principal | ICD-10-CM | POA: Diagnosis present

## 2012-01-04 DIAGNOSIS — D5701 Hb-SS disease with acute chest syndrome: Secondary | ICD-10-CM | POA: Diagnosis present

## 2012-01-04 DIAGNOSIS — D571 Sickle-cell disease without crisis: Secondary | ICD-10-CM | POA: Diagnosis present

## 2012-01-04 LAB — COMPREHENSIVE METABOLIC PANEL
AST: 81 U/L — ABNORMAL HIGH (ref 0–37)
Albumin: 3.8 g/dL (ref 3.5–5.2)
Alkaline Phosphatase: 65 U/L (ref 39–117)
CO2: 21 mEq/L (ref 19–32)
Chloride: 104 mEq/L (ref 96–112)
GFR calc non Af Amer: >90 mL/min (ref >90)
Potassium: 4.1 mEq/L (ref 3.5–5.1)
Total Bilirubin: 11.9 mg/dL — ABNORMAL HIGH (ref 0.3–1.2)

## 2012-01-04 LAB — CBC
HCT: 18.6 % — ABNORMAL LOW (ref 39.0–52.0)
MCV: 89 fL (ref 78.0–100.0)
Platelets: 342 10*3/uL (ref 150–400)
RBC: 2.09 MIL/uL — ABNORMAL LOW (ref 4.22–5.81)
RDW: 24.4 % — ABNORMAL HIGH (ref 11.5–15.5)
WBC: 11 10*3/uL — ABNORMAL HIGH (ref 4.0–10.5)

## 2012-01-04 LAB — PHOSPHORUS: Phosphorus: 4.3 mg/dL (ref 2.3–4.6)

## 2012-01-04 LAB — MAGNESIUM: Magnesium: 1.9 mg/dL (ref 1.5–2.5)

## 2012-01-04 LAB — HEMOGLOBIN AND HEMATOCRIT, BLOOD: HCT: 17.1 % — ABNORMAL LOW (ref 39.0–52.0)

## 2012-01-04 MED ORDER — DIPHENHYDRAMINE HCL 50 MG/ML IJ SOLN: 12.5000 mg | INTRAMUSCULAR | Status: DC | PRN

## 2012-01-04 MED ORDER — DIPHENHYDRAMINE HCL 50 MG/ML IJ SOLN
25.0000 mg | Freq: Once | INTRAMUSCULAR | Status: AC
  Administered 2012-01-04: 25 mg via INTRAVENOUS
  Filled 2012-01-04: qty 1

## 2012-01-04 MED ORDER — HYDROXYUREA 500 MG PO CAPS: 1000.0000 mg | ORAL_CAPSULE | Freq: Every day | ORAL | Status: DC

## 2012-01-04 MED ORDER — SODIUM CHLORIDE 0.9 % IV BOLUS (SEPSIS)
1000.0000 mL | Freq: Once | INTRAVENOUS | Status: AC
  Administered 2012-01-04: 1000 mL via INTRAVENOUS

## 2012-01-04 MED ORDER — HYDROMORPHONE HCL PF 1 MG/ML IJ SOLN
2.0000 mg | INTRAMUSCULAR | Status: DC
  Administered 2012-01-04 (×2): 2 mg via INTRAVENOUS
  Filled 2012-01-04: qty 1
  Filled 2012-01-04: qty 2

## 2012-01-04 MED ORDER — ONDANSETRON HCL 4 MG/2ML IJ SOLN: 4.0000 mg | Freq: Four times a day (QID) | INTRAMUSCULAR | Status: DC | PRN

## 2012-01-04 MED ORDER — ACETAMINOPHEN 325 MG PO TABS
650.0000 mg | ORAL_TABLET | Freq: Once | ORAL | Status: AC
  Administered 2012-01-04: 650 mg via ORAL
  Filled 2012-01-04: qty 2

## 2012-01-04 MED ORDER — HYDROXYUREA 500 MG PO CAPS
1000.0000 mg | ORAL_CAPSULE | ORAL | Status: DC
  Filled 2012-01-04: qty 2

## 2012-01-04 MED ORDER — HYDROMORPHONE HCL PF 1 MG/ML IJ SOLN: 2.0000 mg | INTRAMUSCULAR | Status: DC | PRN

## 2012-01-04 MED ORDER — ONDANSETRON HCL 4 MG/2ML IJ SOLN
4.0000 mg | Freq: Once | INTRAMUSCULAR | Status: AC
  Administered 2012-01-04: 4 mg via INTRAVENOUS
  Filled 2012-01-04: qty 2

## 2012-01-04 MED ORDER — LEVALBUTEROL HCL 0.63 MG/3ML IN NEBU
0.6300 mg | INHALATION_SOLUTION | Freq: Four times a day (QID) | RESPIRATORY_TRACT | Status: DC
  Filled 2012-01-04 (×3): qty 3

## 2012-01-04 MED ORDER — LEVOFLOXACIN 750 MG PO TABS: 750.0000 mg | ORAL_TABLET | Freq: Every day | ORAL | Status: AC

## 2012-01-04 MED ORDER — ALBUTEROL SULFATE (5 MG/ML) 0.5% IN NEBU: 2.5000 mg | INHALATION_SOLUTION | RESPIRATORY_TRACT | Status: DC | PRN

## 2012-01-04 MED ORDER — DEXTROSE-NACL 5-0.45 % IV SOLN
INTRAVENOUS | Status: DC
  Administered 2012-01-04: 14:00:00 via INTRAVENOUS

## 2012-01-04 MED ORDER — FOLIC ACID 1 MG PO TABS
1.0000 mg | ORAL_TABLET | Freq: Every day | ORAL | Status: DC
  Administered 2012-01-04: 1 mg via ORAL
  Filled 2012-01-04 (×2): qty 1

## 2012-01-04 MED ORDER — HYDROMORPHONE HCL PF 1 MG/ML IJ SOLN
2.0000 mg | Freq: Once | INTRAMUSCULAR | Status: AC
  Administered 2012-01-04: 2 mg via INTRAVENOUS
  Filled 2012-01-04: qty 2

## 2012-01-04 MED ORDER — HYDROMORPHONE HCL 4 MG PO TABS: 4.0000 mg | ORAL_TABLET | ORAL | Status: DC | PRN

## 2012-01-04 MED ORDER — LEVALBUTEROL HCL 0.63 MG/3ML IN NEBU
0.6300 mg | INHALATION_SOLUTION | RESPIRATORY_TRACT | Status: DC | PRN
  Filled 2012-01-04: qty 3

## 2012-01-04 MED ORDER — ONDANSETRON HCL 4 MG/2ML IJ SOLN: 4.0000 mg | INTRAMUSCULAR | Status: DC | PRN

## 2012-01-04 MED ORDER — HYDROMORPHONE HCL PF 2 MG/ML IJ SOLN
INTRAMUSCULAR | Status: AC
  Administered 2012-01-04: 2 mg
  Filled 2012-01-04: qty 1

## 2012-01-04 MED ORDER — LEVOFLOXACIN 750 MG PO TABS
750.0000 mg | ORAL_TABLET | Freq: Every day | ORAL | Status: DC
  Administered 2012-01-04: 750 mg via ORAL
  Filled 2012-01-04 (×2): qty 1

## 2012-01-04 MED ORDER — ACETAMINOPHEN 325 MG PO TABS
650.0000 mg | ORAL_TABLET | Freq: Four times a day (QID) | ORAL | Status: DC | PRN
Start: 2012-01-04 — End: 2012-01-04

## 2012-01-04 MED ORDER — HYDROXYUREA 500 MG PO CAPS
1500.0000 mg | ORAL_CAPSULE | Freq: Once | ORAL | Status: AC
  Administered 2012-01-04: 1500 mg via ORAL
  Filled 2012-01-04: qty 3

## 2012-01-04 MED ORDER — ONDANSETRON HCL 4 MG PO TABS: 4.0000 mg | ORAL_TABLET | Freq: Four times a day (QID) | ORAL | Status: DC | PRN

## 2012-01-04 MED ORDER — ENOXAPARIN SODIUM 40 MG/0.4ML ~~LOC~~ SOLN
40.0000 mg | SUBCUTANEOUS | Status: DC
  Administered 2012-01-04: 40 mg via SUBCUTANEOUS
  Filled 2012-01-04 (×2): qty 0.4

## 2012-01-04 MED ORDER — SODIUM CHLORIDE 0.9 % IV SOLN
Freq: Once | INTRAVENOUS | Status: AC
  Administered 2012-01-04: 05:00:00 via INTRAVENOUS

## 2012-01-04 MED ORDER — ACETAMINOPHEN 650 MG RE SUPP: 650.0000 mg | Freq: Four times a day (QID) | RECTAL | Status: DC | PRN

## 2012-01-04 MED ORDER — SODIUM CHLORIDE 0.9 % IV SOLN
INTRAVENOUS | Status: AC
  Administered 2012-01-04: 07:00:00 via INTRAVENOUS

## 2012-01-04 MED ORDER — HYDROXYUREA 500 MG PO CAPS: 1500.0000 mg | ORAL_CAPSULE | ORAL | Status: DC

## 2012-01-04 NOTE — H&P (Signed)
[facility] History and Physical   Date: 01/04/2012  Patient name: Philip Rios Medical record number: [id number] Date of birth: [birth year] Age: 26 y.o. Gender: male PCP: Dorrene German, MD, MD  Attending physician: Gwenyth Bender, MD  Chief Complaint: Diffuse myalgias  History of Present Illness: Mr. Rian Busche is a pleasant 26 year-old African-American male with a PMH of sickle cell disease, CVA and asthma, who presented to the ED complaining of pain typical for his sickle cell crisis.  The patient states that general life stressors, working long hours, not getting enough rest, not consuming proper nutrition and the recent fluctuating weather patterns has all contributed toward precipitating his current crisis.  The patient is complaining of pain in his buttocks, bilateral LEs and chest area (non-cardiac).  The patient states that his pain is currently 0-1/10 and he thinks he may be able to go home today.  The patient has agreed to receive a blood transfusion (H/H is 7.0/18.6) and we'll reassess his status after the transfusion. The patient also states he has had a productive cough.  The patient denies any other symptomatology.  Meds: Prescriptions prior to admission  Medication Sig Dispense Refill  . folic acid (FOLVITE) 1 MG tablet Take 1 tablet (1 mg total) by mouth daily.  30 tablet  1  . HYDROcodone-acetaminophen (NORCO) 5-325 MG per tablet Take 1-2 tablets by mouth every 6 (six) hours as needed. For pain  40 tablet  0  . hydroxyurea (HYDREA) 500 MG capsule Take 2-3 capsules (1,000-1,500 mg total) by mouth daily. Alternates between 2 and 3 tablets each day.  90 capsule  1    Allergies: Codeine Past Medical History  Diagnosis Date  . Sickle cell disease, type SS   . Stroke   . Asthma    Past Surgical History  Procedure Date  . Cholecystectomy    Family History  Problem Relation Age of Onset  . Diabetes Sister    History   Social History  . Marital  Status: Single    Spouse Name: N/A    Number of Children: N/A  . Years of Education: N/A   Occupational History  . Not on file.   Social History Main Topics  . Smoking status: Never Smoker   . Smokeless tobacco: Not on file  . Alcohol Use: 6.0 oz/week    10 Shots of liquor per week  . Drug Use: Yes    Special: Marijuana  . Sexually Active: Yes   Other Topics Concern  . Not on file   Social History Narrative  . No narrative on file    Review of Systems: Pertinent items are noted in HPI.  Physical Exam: Blood pressure 101/49, pulse 67, temperature 98 F (36.7 C), temperature source Oral, resp. rate 16, height 5\' 11"  (1.803 m), weight 135 lb (61.236 kg), SpO2 94.00%. BP 101/49  Pulse 67  Temp(Src) 98 F (36.7 C) (Oral)  Resp 16  Ht 5\' 11"  (1.803 m)  Wt 135 lb (61.236 kg)  BMI 18.83 kg/m2  SpO2 94%  General Appearance:    Alert, cooperative, no distress, appears stated age  Head:    Normocephalic, without obvious abnormality, atraumatic  Eyes:    PERRL, EOMI, scleral icterus  Nose:   Nares normal, septum midline, mucosa normal, no drainage    or sinus tenderness  Throat:   Lips, mucosa, and tongue normal; teeth and gums normal  Neck:   Supple, symmetrical, trachea midline, no adenopathy;  thyroid:  No enlargement/tenderness/nodules  Back:     Symmetric, no curvature, ROM normal, no CVA tenderness  Lungs:     Clear to auscultation bilaterally, respirations unlabored  Chest wall:    No tenderness or deformity  Heart:    Regular rate and rhythm, S1 and S2 normal, no murmur, rub   or gallop  Abdomen:     Soft, non-tender, hypoactive bowel sounds    no masses, no organomegaly  Extremities:   Extremities normal, atraumatic, no cyanosis or edema  Pulses:   2+ and symmetric all extremities  Skin:   Skin color, texture, turgor normal, no rashes or lesions,   piercings  Neurologic:   CNII-XII intact. Normal strength, sensation and reflexes      throughout    Lab  results: Results for orders placed during the hospital encounter of 01/04/12 (from the past 24 hour(s))  CBC     Status: Abnormal   Collection Time   01/04/12  5:00 AM      Component Value Range   WBC 11.0 (*) 4.0 - 10.5 (K/uL)   RBC 2.09 (*) 4.22 - 5.81 (MIL/uL)   Hemoglobin 7.0 (*) 13.0 - 17.0 (g/dL)   HCT 96.0 (*) 45.4 - 52.0 (%)   MCV 89.0  78.0 - 100.0 (fL)   MCH 33.5  26.0 - 34.0 (pg)   MCHC 37.6 (*) 30.0 - 36.0 (g/dL)   RDW 09.8 (*) 11.9 - 15.5 (%)   Platelets 342  150 - 400 (K/uL)  COMPREHENSIVE METABOLIC PANEL     Status: Abnormal   Collection Time   01/04/12  5:00 AM      Component Value Range   Sodium 136  135 - 145 (mEq/L)   Potassium 4.1  3.5 - 5.1 (mEq/L)   Chloride 104  96 - 112 (mEq/L)   CO2 21  19 - 32 (mEq/L)   Glucose, Bld 93  70 - 99 (mg/dL)   BUN 9  6 - 23 (mg/dL)   Creatinine, Ser 1.47  0.50 - 1.35 (mg/dL)   Calcium 8.7  8.4 - 82.9 (mg/dL)   Total Protein 7.0  6.0 - 8.3 (g/dL)   Albumin 3.8  3.5 - 5.2 (g/dL)   AST 81 (*) 0 - 37 (U/L)   ALT 29  0 - 53 (U/L)   Alkaline Phosphatase 65  39 - 117 (U/L)   Total Bilirubin 11.9 (*) 0.3 - 1.2 (mg/dL)   GFR calc non Af Amer >90  >90 (mL/min)   GFR calc Af Amer >90  >90 (mL/min)  RETICULOCYTES     Status: Abnormal   Collection Time   01/04/12  5:00 AM      Component Value Range   Retic Ct Pct 28.3 (*) 0.4 - 3.1 (%)   RBC. 2.08 (*) 4.22 - 5.81 (MIL/uL)   Retic Count, Manual 588.6 (*) 19.0 - 186.0 (K/uL)    Imaging results:  Dg Chest 2 View  01/04/2012  *RADIOLOGY REPORT*  Clinical Data: Anterior chest pain.  Sickle cell.  Asthma.  CHEST - 2 VIEW  Comparison: 10/30/2011  Findings: Normal heart size and pulmonary vascularity.  Mild hyperinflation compatible with history of asthma.  No focal airspace consolidation in the lungs.  No blunting of costophrenic angles.  No pneumothorax.  Surgical clips in the right upper quadrant.  The spleen shadow is small with increased density consistent with sickle cell changes.   IMPRESSION: No evidence of active pulmonary disease.  No significant changes since previous  study.  Original Report Authenticated By: Marlon Pel, M.D.     Assessment & Plan: Sickle cell crisis/Sickle cell anemia:  The patient will receive a blood transfusion, scheduled pain management, IV hydration, nausea/pruritis management, home medications and DVT prophylaxis.  Ferritin, Hemoglobinopathy, Mg, Phos and ProBNP are pending. Leukocytosis/Sickle Chest Syndrome:  The patient has been started on antibiotic therapy with Levaquin.  IS usage has been encouraged.  The patient will also receive scheduled nebulizer treatments.   Larina Bras 01/04/2012, 1:47 PM

## 2012-01-04 NOTE — Discharge Summary (Signed)
[facility] Discharge Summary  Patient ID: Philip NGHIEM MRN: [id number] DOB/AGE: [birth year] 26 y.o.  Admit date: 01/04/2012 Discharge date: 01/04/2012  Admission Diagnoses: Sickle cell crisis Sickle cell anemia Leukocytosis Sickle Chest Syndrome   Discharge Diagnoses:  Sickle cell crisis Sickle cell anemia Leukocytosis Sickle Chest Syndrome   Discharged Condition: Stable  Hospital Course: Please refer to the detailed H&P for information surrounding this patient's admission.  In short,  Mr. Philip Rios is a pleasant 26 year-old African-American male with a PMH of sickle cell disease, CVA and asthma, who presented to the ED complaining of pain typical for his sickle cell crisis. The patient states that general life stressors, working long hours, not getting enough rest, not consuming proper nutrition and the recent fluctuating weather patterns has all contributed toward precipitating his current crisis. The patient is complaining of pain in his buttocks, bilateral LEs and chest area (non-cardiac). The patient states that his pain is currently 0-1/10 and he thinks he may be able to go home today. The patient agreed to and received a blood transfusion.  The patient was given antibiotic therapy for his productive cough and sickle chest syndrome.  The patient stated that he was feeling better and ready to manage his pain at home.  The patient will be discharged to home in stable condition at this time.   Consults: None  Significant Diagnostic Studies:  Dg Chest 2 View  01/04/2012  *RADIOLOGY REPORT*  Clinical Data: Anterior chest pain.  Sickle cell.  Asthma.  CHEST - 2 VIEW  Comparison: 10/30/2011  Findings: Normal heart size and pulmonary vascularity.  Mild hyperinflation compatible with history of asthma.  No focal airspace consolidation in the lungs.  No blunting of costophrenic angles.  No pneumothorax.  Surgical clips in the right upper quadrant.  The spleen shadow is small  with increased density consistent with sickle cell changes.  IMPRESSION: No evidence of active pulmonary disease.  No significant changes since previous study.  Original Report Authenticated By: Marlon Pel, M.D.     Treatments: IV hydration, Antibiotics: Levaquin, Analgesia: Dilaudid, Anticoagulation: Lovenox and Blood transfusion  Discharge Exam: Blood pressure 107/54, pulse 66, temperature 98.3 F (36.8 C), temperature source Oral, resp. rate 16, height 5\' 11"  (1.803 m), weight 135 lb (61.236 kg), SpO2 98.00%.  General Appearance:  Alert, cooperative, no distress, appears stated age   Head:  Normocephalic, without obvious abnormality, atraumatic   Eyes:  PERRL, EOMI, scleral icterus   Nose:  Nares normal, septum midline, mucosa normal, no drainage or sinus tenderness   Throat:  Lips, mucosa, and tongue normal; teeth and gums normal   Neck:  Supple, symmetrical, trachea midline, no adenopathy;  thyroid: No enlargement/tenderness/nodules   Back:  Symmetric, no curvature, ROM normal, no CVA tenderness   Lungs:  Clear to auscultation bilaterally, respirations unlabored   Chest wall:  No tenderness or deformity   Heart:  Regular rate and rhythm, S1 and S2 normal, no murmur, rub or gallop   Abdomen:  Soft, non-tender, hypoactive bowel sounds  no masses, no organomegaly   Extremities:  Extremities normal, atraumatic, no cyanosis or edema   Pulses:  2+ and symmetric all extremities   Skin:  Skin color, texture, turgor normal, no rashes or lesions, piercings   Neurologic:  CNII-XII intact. Normal strength, sensation and reflexes  throughout     Disposition: 01-Home or Self Care  Discharge Orders    Future Orders Please Complete By Expires   Increase  activity slowly      Discharge instructions      Comments:   Take all medications as prescribed Keep warm Drink plenty of fluids Get plenty of rest Limit exposure to stress and/or stressful events Keep all follow-up  appointments Use incentive spirometer daily   Call MD for:  severe uncontrolled pain        Medication List  As of 01/04/2012 11:00 PM   TAKE these medications         folic acid 1 MG tablet   Commonly known as: FOLVITE   Take 1 tablet (1 mg total) by mouth daily.      HYDROcodone-acetaminophen 5-325 MG per tablet   Commonly known as: NORCO   Take 1-2 tablets by mouth every 6 (six) hours as needed. For pain      hydroxyurea 500 MG capsule   Commonly known as: HYDREA   Take 2-3 capsules (1,000-1,500 mg total) by mouth daily. Alternates between 2 and 3 tablets each day.      levofloxacin 750 MG tablet   Commonly known as: LEVAQUIN   Take 1 tablet (750 mg total) by mouth daily.           Follow-up Information    Follow up with AVBUERE,EDWIN A, MD. Schedule an appointment as soon as possible for a visit in 1 week. (Return to the ER or come [facility] if symptoms worsen)         Time spent on discharge:  Greater than 30 minutes  Signed: Larina Bras 01/04/2012, 7:52 PM

## 2012-01-04 NOTE — ED Provider Notes (Signed)
History     CSN: [id number]  Arrival date & time 01/04/12  0404   First MD Initiated Contact with Patient 01/04/12 0421      Chief Complaint  Patient presents with  . Sickle Cell Pain Crisis    (Consider location/radiation/quality/duration/timing/severity/associated sxs/prior treatment) Patient is a 26 y.o. male presenting with sickle cell pain. The history is provided by the patient.  Sickle Cell Pain Crisis    patient here with sickle cell pain crisis. Pain is localized as entire body. History of similar symptoms with a sickle cell attacks. No fever or cough. No vomiting or diarrhea. No rashes. Has used his pain medication at home without relief. Last sickle cell exacerbation was a month ago.  Past Medical History  Diagnosis Date  . Sickle cell disease, type SS   . Stroke   . Asthma     Past Surgical History  Procedure Date  . Cholecystectomy     Family History  Problem Relation Age of Onset  . Diabetes Sister     History  Substance Use Topics  . Smoking status: Never Smoker   . Smokeless tobacco: Not on file  . Alcohol Use: 6.0 oz/week    10 Shots of liquor per week      Review of Systems  All other systems reviewed and are negative.    Allergies  Codeine  Home Medications   Current Outpatient Rx  Name Route Sig Dispense Refill  . FOLIC ACID 1 MG PO TABS Oral Take 1 tablet (1 mg total) by mouth daily. 30 tablet 1  . HYDROCODONE-ACETAMINOPHEN 5-325 MG PO TABS Oral Take 1-2 tablets by mouth every 6 (six) hours as needed. For pain 40 tablet 0  . HYDROXYUREA 500 MG PO CAPS Oral Take 2-3 capsules (1,000-1,500 mg total) by mouth daily. Alternates between 2 and 3 tablets each day. 90 capsule 1    BP 135/83  Pulse 82  Temp(Src) 97.7 F (36.5 C) (Oral)  Resp 17  SpO2 100%  Physical Exam  Nursing note and vitals reviewed. Constitutional: He is oriented to person, place, and time. He appears well-developed and well-nourished.  Non-toxic appearance. No  distress.  HENT:  Head: Normocephalic and atraumatic.  Eyes: Conjunctivae, EOM and lids are normal. Pupils are equal, round, and reactive to light.  Neck: Normal range of motion. Neck supple. No tracheal deviation present. No mass present.  Cardiovascular: Normal rate, regular rhythm and normal heart sounds.  Exam reveals no gallop.   No murmur heard. Pulmonary/Chest: Effort normal and breath sounds normal. No stridor. No respiratory distress. He has no decreased breath sounds. He has no wheezes. He has no rhonchi. He has no rales.  Abdominal: Soft. Normal appearance and bowel sounds are normal. He exhibits no distension. There is no tenderness. There is no rebound and no CVA tenderness.  Musculoskeletal: Normal range of motion. He exhibits no edema and no tenderness.  Neurological: He is alert and oriented to person, place, and time. He has normal strength. No cranial nerve deficit or sensory deficit. GCS eye subscore is 4. GCS verbal subscore is 5. GCS motor subscore is 6.  Skin: Skin is warm and dry. No abrasion and no rash noted.  Psychiatric: He has a normal mood and affect. His speech is normal and behavior is normal.    ED Course  Procedures (including critical care time)   Labs Reviewed  RETICULOCYTES  CBC  DIFFERENTIAL  COMPREHENSIVE METABOLIC PANEL   No results found.   No  diagnosis found.    MDM  Patient given IV fluids and pain medications. Still complains of some pain. He'll be admitted by triad hospitalist        Toy Baker, MD 01/04/12 [phone]

## 2012-01-04 NOTE — Progress Notes (Signed)
   CARE MANAGEMENT NOTE 01/04/2012  Patient:  Philip Rios, Philip Rios   Account Number:  [id number]  Date Initiated:  01/04/2012  Documentation initiated by:  Lanier Clam  Subjective/Objective Assessment:   ADMITTED W/SICKLE CELL PAIN CRISIS     Action/Plan:   FROM HOME ALONE,BUT HAS FAMILY SUPPORT.HAS PCP,PHARMACY,TRANSP.   Anticipated DC Date:  01/07/2012   Anticipated DC Plan:  HOME/SELF CARE         Choice offered to / List presented to:             Status of service:  In process, will continue to follow Medicare Important Message given?   (If response is "NO", the following Medicare IM given date fields will be blank) Date Medicare IM given:   Date Additional Medicare IM given:    Discharge Disposition:    Per UR Regulation:  Reviewed for med. necessity/level of care/duration of stay  If discussed at Long Length of Stay Meetings, dates discussed:    Comments:  01/04/12 [facility] RN,BSN NCM [phone]

## 2012-01-04 NOTE — Progress Notes (Signed)
After receiving 1 unit of PRBC's with no signs/symptoms of reaction (tolerated well), Patient was given discharge instructions and antibiotic script.  Patient rated pain a 0.  IV was d/c'd and pressure dressing applied.  Patient was very pleasant and appreciative upon discharge.  He left the hospital alert and oriented with a friend.Arvilla Market, [place]acht [place]

## 2012-01-04 NOTE — ED Notes (Signed)
Pt c/o sickle cell pain that began about 30 mins ago. Pt states the pain woke him from his sleep.

## 2012-01-04 NOTE — ED Notes (Signed)
Pt c/o sickle cell pain that began 30 mins ago.

## 2012-01-08 LAB — TYPE AND SCREEN
Antibody Screen: NEGATIVE
Unit division: 0
Unit division: 0

## 2012-01-09 LAB — HEMOGLOBINOPATHY EVALUATION
Hemoglobin Other: 0 %
Hgb A2 Quant: 3.1 % (ref 2.2–3.2)
Hgb A: 4.5 % — ABNORMAL LOW (ref 96.8–97.8)
Hgb F Quant: 6.1 % — ABNORMAL HIGH (ref 0.0–2.0)

## 2012-02-26 ENCOUNTER — Non-Acute Institutional Stay (HOSPITAL_COMMUNITY)
Admission: AD | Admit: 2012-02-26 | Discharge: 2012-02-26 | Disposition: A | Discharge status: Home / Self Care | Payer: Federal, State, Local not specified - PPO | Attending: Internal Medicine | Admitting: Internal Medicine

## 2012-02-26 ENCOUNTER — Encounter (HOSPITAL_COMMUNITY): Payer: Self-pay | Admitting: Emergency Medicine

## 2012-02-26 ENCOUNTER — Emergency Department (HOSPITAL_COMMUNITY)
Admission: EM | Admit: 2012-02-26 | Discharge: 2012-02-26 | Disposition: A | Discharge status: Home / Self Care | Payer: Federal, State, Local not specified - PPO | Attending: Emergency Medicine | Admitting: Emergency Medicine

## 2012-02-26 ENCOUNTER — Emergency Department (HOSPITAL_COMMUNITY): Payer: Federal, State, Local not specified - PPO

## 2012-02-26 DIAGNOSIS — R079 Chest pain, unspecified: Secondary | ICD-10-CM | POA: Insufficient documentation

## 2012-02-26 DIAGNOSIS — R109 Unspecified abdominal pain: Secondary | ICD-10-CM | POA: Insufficient documentation

## 2012-02-26 DIAGNOSIS — D5700 Hb-SS disease with crisis, unspecified: Secondary | ICD-10-CM | POA: Insufficient documentation

## 2012-02-26 DIAGNOSIS — R17 Unspecified jaundice: Secondary | ICD-10-CM | POA: Insufficient documentation

## 2012-02-26 DIAGNOSIS — M79609 Pain in unspecified limb: Secondary | ICD-10-CM | POA: Insufficient documentation

## 2012-02-26 LAB — CBC
Platelets: 403 10*3/uL — ABNORMAL HIGH (ref 150–400)
RDW: 22.8 % — ABNORMAL HIGH (ref 11.5–15.5)
WBC: 8.5 10*3/uL (ref 4.0–10.5)

## 2012-02-26 LAB — RETICULOCYTES
RBC.: 2.45 MIL/uL — ABNORMAL LOW (ref 4.22–5.81)
Retic Count, Absolute: 835.5 10*3/uL — ABNORMAL HIGH (ref 19.0–186.0)
Retic Ct Pct: 34.1 % — ABNORMAL HIGH (ref 0.4–3.1)

## 2012-02-26 LAB — DIFFERENTIAL
Basophils Absolute: 0.1 10*3/uL (ref 0.0–0.1)
Eosinophils Absolute: 0.8 10*3/uL — ABNORMAL HIGH (ref 0.0–0.7)
Lymphocytes Relative: 24 % (ref 12–46)
Lymphs Abs: 2 10*3/uL (ref 0.7–4.0)
Monocytes Relative: 12 % (ref 3–12)

## 2012-02-26 LAB — COMPREHENSIVE METABOLIC PANEL
ALT: 34 U/L (ref 0–53)
Calcium: 9.2 mg/dL (ref 8.4–10.5)
Creatinine, Ser: 0.63 mg/dL (ref 0.50–1.35)
GFR calc Af Amer: >90 mL/min (ref >90)
Glucose, Bld: 86 mg/dL (ref 70–99)
Sodium: 136 mEq/L (ref 135–145)
Total Protein: 7.9 g/dL (ref 6.0–8.3)

## 2012-02-26 MED ORDER — ONDANSETRON HCL 4 MG/2ML IJ SOLN: 4.0000 mg | INTRAMUSCULAR | Status: DC | PRN

## 2012-02-26 MED ORDER — FOLIC ACID 1 MG PO TABS
1.0000 mg | ORAL_TABLET | Freq: Every day | ORAL | Status: DC
  Administered 2012-02-26: 1 mg via ORAL
  Filled 2012-02-26: qty 1

## 2012-02-26 MED ORDER — ONDANSETRON HCL 4 MG PO TABS: 4.0000 mg | ORAL_TABLET | ORAL | Status: DC | PRN

## 2012-02-26 MED ORDER — HYDROMORPHONE HCL PF 2 MG/ML IJ SOLN
2.0000 mg | INTRAMUSCULAR | Status: DC | PRN
  Administered 2012-02-26: 2 mg via INTRAVENOUS
  Filled 2012-02-26: qty 1

## 2012-02-26 MED ORDER — SODIUM CHLORIDE 0.9 % IV BOLUS (SEPSIS)
1000.0000 mL | Freq: Once | INTRAVENOUS | Status: AC
  Administered 2012-02-26: 1000 mL via INTRAVENOUS

## 2012-02-26 MED ORDER — DEXTROSE-NACL 5-0.45 % IV SOLN
INTRAVENOUS | Status: DC
  Administered 2012-02-26: 12:00:00 via INTRAVENOUS

## 2012-02-26 MED ORDER — HYDROMORPHONE HCL PF 1 MG/ML IJ SOLN
1.0000 mg | Freq: Once | INTRAMUSCULAR | Status: AC
  Administered 2012-02-26: 1 mg via INTRAVENOUS
  Filled 2012-02-26: qty 1

## 2012-02-26 MED ORDER — DIPHENHYDRAMINE HCL 50 MG/ML IJ SOLN: 12.5000 mg | INTRAMUSCULAR | Status: DC | PRN

## 2012-02-26 MED ORDER — DIPHENHYDRAMINE HCL 25 MG PO CAPS: 25.0000 mg | ORAL_CAPSULE | ORAL | Status: DC | PRN

## 2012-02-26 NOTE — ED Notes (Signed)
To ED via private vehicle with c/o sickle cell pain, started this am at 6. abd pain, bilat leg pain, states last crisis was 1 1/2 months ago.  Sclera jaundiced- states "more yellow during crisis"

## 2012-02-26 NOTE — Discharge Summary (Signed)
[facility]  Patient ID: Philip Rios MRN: [id number] DOB/AGE: Nov 24, 1985 25 y.o.  Admit date: 02/26/2012 Discharge date: 02/26/2012  Admission Diagnoses: Sickle cell crisis  Discharge Diagnoses:  Sickle cell crisis Hemoglobin SS disease.  Discharged Condition: Improved  Clinic Course: [facility] for further treatment of his lower back and right side pain after his initial treatment in emergency room. He had marked decrease in pain after IV fluids and Dilaudid. This was continued in the day unit. His pain decreased from 8/10 to a 0/10. He is presently feeling much better. He feels that he can manage his pain at home with oral medication. He is followed by Dr. Fleet Contras. He's been encouraged to arrange for followup within the next 2 weeks time.  Consults: None   Discharge Exam: Blood pressure 103/53, pulse 87, temperature 97.3 F (36.3 C), temperature source Oral, resp. rate 18. Well-developed well-nourished black male presently in no acute distress. HEENT: Bowel sclera icterus. No sinus tenderness. Posterior pharynx is clear. NECK: No enlarged thyroid. No posterior cervical nodes. LUNGS: Clear to auscultation. No vocal fremitus. CV: Normal S1, S2 without S3. ABDOMEN: No enlarged liver. No masses appreciated. MSK: No significant tenderness in the lower lumbar sacral spine. No knee tenderness. Negative Homans. NEURO: Intact.  Disposition: 01-Home or Self Care  Discharge Orders    Future Orders Please Complete By Expires   Diet - low sodium heart healthy      Increase activity slowly      No wound care      Call MD for:  severe uncontrolled pain      Discharge instructions      Comments:   Follow up with primary care physician in two weeks.     Medication List  As of 02/26/2012  1:34 PM   TAKE these medications         folic acid 1 MG tablet   Commonly known as: FOLVITE   Take 1 tablet (1  mg total) by mouth daily.      HYDROcodone-acetaminophen 5-325 MG per tablet   Commonly known as: NORCO   Take 1-2 tablets by mouth every 6 (six) hours as needed. For pain      hydroxyurea 500 MG capsule   Commonly known as: HYDREA   Take 2-3 capsules (1,000-1,500 mg total) by mouth daily. Alternates between 2 and 3 tablets each day.             SignedAugust Saucer, Daune Colgate 02/26/2012, 1:34 PM

## 2012-02-26 NOTE — Progress Notes (Signed)
Patient ID: Philip Rios, male   DOB: [birth year], 26 y.o.   MRN: [id number] Discharge orders received. IV d/c'd cannula intact.  Discharge teaching done. Pt denies any pain.  Pt escorted to the parking lot via wheelchair due to dizziness from previous meds. Friend is picking him up

## 2012-02-26 NOTE — Discharge Instructions (Signed)
Sickle Cell Pain Crisis Sickle cell anemia requires regular medical attention by your healthcare provider and awareness about when to seek medical care. Pain is a common problem in children with sickle cell disease. This usually starts at less than 24 year of age. Pain can occur nearly anywhere in the body but most commonly occurs in the extremities, back, chest, or belly (abdomen). Pain episodes can start suddenly or may follow an illness. These attacks can appear as decreased activity, loss of appetite, change in behavior, or simply complaints of pain. DIAGNOSIS   Specialized blood and gene testing can help make this diagnosis early in the disease. Blood tests may then be done to watch blood levels.   Specialized brain scans are done when there are problems in the brain during a crisis.   Lung testing may be done later in the disease.  HOME CARE INSTRUCTIONS   Maintain good hydration. Increase you or your child's fluid intake in hot weather and during exercise.   Avoid smoking. Smoking lowers the oxygen in the blood and can cause the production of sickle-shaped cells (sickling).   Control pain. Only take over-the-counter or prescription medicines for pain, discomfort, or fever as directed by your caregiver. Do not give aspirin to children because of the association with Reye's syndrome.   Keep regular health care checks to keep a proper red blood cell (hemoglobin) level. A moderate anemia level protects against sickling crises.   You and your child should receive all the same immunizations and care as the people around you.   Mothers should breastfeed their babies if possible. Use formulas with iron added if breastfeeding is not possible. Additional iron should not be given unless there is a lack of it. People with sickle cell disease (SCD) build up iron faster than normal. Give folic acid and additional vitamins as directed.   If you or your child has been prescribed antibiotics or other  medications to prevent problems, take them as directed.   Summer camps are available for children with SCD. They may help young people deal with their disease. The camps introduce them to other children with the same problem.   Young people with SCD may become frustrated or angry at their disease. This can cause rebellion and refusal to follow medical care. Help groups or counseling may help with these problems.   Wear a medical alert bracelet. When traveling, keep medical information, caregiver's names, and the medications you or your child takes with you at all times.  SEEK IMMEDIATE MEDICAL CARE IF:   You or your child develops dizziness or fainting, numbness in or difficulty with movement of arms and legs, difficulty with speech, or is acting abnormally. This could be early signs of a stroke. Immediate treatment is necessary.   You or your child has an oral temperature above 102 F (38.9 C), not controlled by medicine.   You or your child has other signs of infection (chills, lethargy, irritability, poor eating, vomiting). The younger the child, the more you should be concerned.   With fevers, do not give medicine to lower the fever right away. This could cover up a problem that is developing. Notify your caregiver.   You or your child develops pain that is not helped with medicine.   You or your child develops shortness of breath or is coughing up pus-like or bloody sputum.   You or your child develops any problems that are new and are causing you to worry.   You or   your child develops a persistent, often uncomfortable and painful penile erection. This is called priapism. Always check young boys for this. It is often embarrassing for them and they may not bring it to your attention. This is a medical emergency and needs immediate treatment. If this is not treated it will lead to impotence.   You or your child develops a new onset of abdominal pain, especially on the left side near the  stomach area.   You or your child has any questions or has problems that are not getting better. Return immediately if you feel your child is getting worse, even if your child was seen only a short while ago.  Document Released: 06/27/2005 Document Revised: 09/06/2011 Document Reviewed: 11/16/2009 [employer] Patient Information 2012 [employer]. 

## 2012-02-26 NOTE — ED Provider Notes (Signed)
History     CSN: [id number]  Arrival date & time 02/26/12  0844   First MD Initiated Contact with Patient 02/26/12 [phone]      Chief Complaint  Patient presents with  . Abdominal Pain  . Sickle Cell Pain Crisis    (Consider location/radiation/quality/duration/timing/severity/associated sxs/prior treatment) HPI Comments: Patient with history of Sickle Cell SS comes in today with a chief complaint of abdominal pain and bilateral leg pain.  He reports that this is his typical pain that he has with a sickle cell crisis.  No different than his typical pain.  Pain started approximately 3 hours prior to arrival.  He tried taking hydrocodone for the pain at home, but does not feel that it helped.  He also takes daily Hydroxyurea and Folic Acid.  His PCP is Dr. Concepcion Elk.    Patient is a 26 y.o. male presenting with sickle cell pain. The history is provided by the patient.  Sickle Cell Pain Crisis  This is a new problem. The current episode started today. The onset was sudden. The problem has been gradually worsening. The pain is associated with an unknown factor. The pain is similar to prior episodes. The pain is severe. Associated symptoms include chest pain and abdominal pain. Pertinent negatives include no constipation, no diarrhea, no nausea, no vomiting, no headaches, no joint pain, no loss of sensation, no tingling, no weakness, no cough and no difficulty breathing.    Past Medical History  Diagnosis Date  . Sickle cell disease, type SS   . Stroke   . Asthma     Past Surgical History  Procedure Date  . Cholecystectomy     Family History  Problem Relation Age of Onset  . Diabetes Sister     History  Substance Use Topics  . Smoking status: Never Smoker   . Smokeless tobacco: Not on file  . Alcohol Use: 6.0 oz/week    10 Shots of liquor per week     socially      Review of Systems  Constitutional: Negative for fever, chills and diaphoresis.  Respiratory: Negative for  cough.   Cardiovascular: Positive for chest pain. Negative for palpitations and leg swelling.  Gastrointestinal: Positive for abdominal pain. Negative for nausea, vomiting, diarrhea and constipation.  Musculoskeletal: Negative for joint pain and gait problem.       Bilateral leg pain  Skin: Negative for color change.  Neurological: Negative for tingling, syncope, weakness, light-headedness, numbness and headaches.  Psychiatric/Behavioral: Negative for confusion.    Allergies  Codeine  Home Medications   Current Outpatient Rx  Name Route Sig Dispense Refill  . FOLIC ACID 1 MG PO TABS Oral Take 1 tablet (1 mg total) by mouth daily. 30 tablet 1  . HYDROCODONE-ACETAMINOPHEN 5-325 MG PO TABS Oral Take 1-2 tablets by mouth every 6 (six) hours as needed. For pain 40 tablet 0  . HYDROXYUREA 500 MG PO CAPS Oral Take 2-3 capsules (1,000-1,500 mg total) by mouth daily. Alternates between 2 and 3 tablets each day. 90 capsule 1    BP 125/65  Pulse 73  Temp(Src) 98.3 F (36.8 C) (Oral)  Resp 18  SpO2 93%  Physical Exam  Nursing note and vitals reviewed. Constitutional: He appears well-developed and well-nourished. No distress.  HENT:  Head: Normocephalic and atraumatic.  Mouth/Throat: Oropharynx is clear and moist.  Eyes: Scleral icterus is present.  Neck: Normal range of motion. Neck supple.  Cardiovascular: Normal rate, regular rhythm, normal heart sounds and intact distal  pulses.   Pulmonary/Chest: Effort normal and breath sounds normal. No respiratory distress. He has no wheezes. He has no rales.  Abdominal: Soft. Bowel sounds are normal. He exhibits no distension and no mass. There is no tenderness. There is no guarding.  Musculoskeletal: Normal range of motion. He exhibits no edema.  Neurological: He is alert.  Skin: Skin is warm, dry and intact. He is not diaphoretic. No erythema.  Psychiatric: He has a normal mood and affect.    ED Course  Procedures (including critical care  time)   Labs Reviewed  CBC  DIFFERENTIAL  COMPREHENSIVE METABOLIC PANEL  RETICULOCYTES   No results found.   No diagnosis found.  10:22 AM Discussed with [facility].  They report that they will call back. 10:33 AM Reassessed patient.  He reports that his pain has improved.  Discussed with Dr. August Saucer with the [facility].  He reports that the patient can come to the center for further management.    MDM  Patient with a history of Sickle Cell SS disease.  Patient complaining of abdominal pain and bilateral leg pain. He reports that this pain is the typical pain that he has with Sickle Cell Crisis.  VSS.  Patient discharged to [facility] for further management.          Pascal Lux Dauphin, PA-C 02/26/12 1824

## 2012-02-26 NOTE — ED Notes (Signed)
Report given Aram Beecham, at [facility].

## 2012-02-26 NOTE — Progress Notes (Signed)
11:45    Spoke with Evangeline Gula and received report on patient upon arriving to the sickle cell unit. Vitals were stable, patient alert and oriented no complications noted.  Patient picked up from ED via W/C.

## 2012-02-27 NOTE — ED Provider Notes (Signed)
Medical screening examination/treatment/procedure(s) were performed by non-physician practitioner and as supervising physician I was immediately available for consultation/collaboration.  Marrisa Kimber, MD 02/27/12 0710 

## 2012-03-10 NOTE — H&P (Signed)
[facility]   Patient ID: Philip Rios, male   DOB: 1986-05-23, 26 y.o.   MRN: [id number]  Chief Complaint  Patient presents with  . Sickle Cell Pain Crisis    HPI MATTIX Rios is a 26 y.o. male.  Patient has hemoglobin SS disease followed by Dr. Concepcion Elk. He had been doing well up until recently when he developed abdominal pain and leg pain. Patient presented to the emergency room. He was treated initially there with IV fluids and pain medication. He did improve but his pain was not manageable at home. He subsequently is transferred to the sickle cell day unit for admission.   Past Medical History  Diagnosis Date  . Sickle cell disease, type SS   . Stroke   . Asthma     Past Surgical History  Procedure Date  . Cholecystectomy     Family History  Problem Relation Age of Onset  . Diabetes Sister     Social History History  Substance Use Topics  . Smoking status: Never Smoker   . Smokeless tobacco: Not on file  . Alcohol Use: 6.0 oz/week    10 Shots of liquor per week     socially    Allergies  Allergen Reactions  . Codeine Hives    No current facility-administered medications for this encounter.   Current Outpatient Prescriptions  Medication Sig Dispense Refill  . folic acid (FOLVITE) 1 MG tablet Take 1 tablet (1 mg total) by mouth daily.  30 tablet  1  . HYDROcodone-acetaminophen (NORCO) 5-325 MG per tablet Take 1-2 tablets by mouth every 6 (six) hours as needed. For pain  40 tablet  0  . hydroxyurea (HYDREA) 500 MG capsule Take 2-3 capsules (1,000-1,500 mg total) by mouth daily. Alternates between 2 and 3 tablets each day.  90 capsule  1    Review of Systems As noted above otherwise unremarkable.  Blood pressure 103/53, pulse 87, temperature 97.3 F (36.3 C), temperature source Oral, resp. rate 18.  Physical Exam Well-developed well nourished black male in mild distress. HEENT: Head normocephalic atraumatic without bruits. Extraocular  muscles are intact. Positive sclera icterus. TMs are clear. NECK: No enlarged thyroid. No posterior cervical nodes. LUNGS: Clear to auscultation. No CVA tenderness. ABDOMEN: Bowel sounds present. No masses or tenderness. MSK: No tenderness of the lower lumbar sacral spine. No joint tenderness. Negative Homans. NEURO: Intact.  Data Reviewed  No results found for this or any previous visit (from the past 48 hour(s)). Electrolytes: Sodium 136, potassium 4.2, chloride 101, CO2 22, BUN of 5, total bilirubin of 11.8. CBC: WBC of 8500, hemoglobin of 8.3, hematocrit 21.4.  Assessment    Sickle cell crises. Limb pain secondary to 1. Active hemolysis.    Plan    Continue IV fluids. Will use half-normal saline at 125 cc an hour. Dilaudid 2-4 mg IV every 2 hours when necessary pain. Reassess in 8 hours for further therapy.       August Saucer, Tifani Dack 02/26/2012  01:30 PM

## 2012-03-12 ENCOUNTER — Encounter (HOSPITAL_COMMUNITY): Payer: Self-pay | Admitting: *Deleted

## 2012-03-12 ENCOUNTER — Emergency Department (HOSPITAL_COMMUNITY): Payer: Federal, State, Local not specified - PPO

## 2012-03-12 ENCOUNTER — Inpatient Hospital Stay (HOSPITAL_COMMUNITY)
Admission: EM | Admit: 2012-03-12 | Discharge: 2012-03-15 | DRG: 574 | Payer: Federal, State, Local not specified - PPO | Attending: Internal Medicine | Admitting: Internal Medicine

## 2012-03-12 DIAGNOSIS — E872 Acidosis, unspecified: Secondary | ICD-10-CM | POA: Diagnosis present

## 2012-03-12 DIAGNOSIS — E875 Hyperkalemia: Secondary | ICD-10-CM

## 2012-03-12 DIAGNOSIS — R17 Unspecified jaundice: Secondary | ICD-10-CM | POA: Diagnosis present

## 2012-03-12 DIAGNOSIS — Z885 Allergy status to narcotic agent status: Secondary | ICD-10-CM

## 2012-03-12 DIAGNOSIS — R6889 Other general symptoms and signs: Secondary | ICD-10-CM | POA: Diagnosis present

## 2012-03-12 DIAGNOSIS — Z8673 Personal history of transient ischemic attack (TIA), and cerebral infarction without residual deficits: Secondary | ICD-10-CM

## 2012-03-12 DIAGNOSIS — D594 Other nonautoimmune hemolytic anemias: Secondary | ICD-10-CM

## 2012-03-12 DIAGNOSIS — E861 Hypovolemia: Secondary | ICD-10-CM | POA: Diagnosis present

## 2012-03-12 DIAGNOSIS — D571 Sickle-cell disease without crisis: Secondary | ICD-10-CM

## 2012-03-12 DIAGNOSIS — Z9089 Acquired absence of other organs: Secondary | ICD-10-CM

## 2012-03-12 DIAGNOSIS — R4182 Altered mental status, unspecified: Secondary | ICD-10-CM | POA: Diagnosis not present

## 2012-03-12 DIAGNOSIS — F1210 Cannabis abuse, uncomplicated: Secondary | ICD-10-CM | POA: Diagnosis present

## 2012-03-12 DIAGNOSIS — D5720 Sickle-cell/Hb-C disease without crisis: Secondary | ICD-10-CM

## 2012-03-12 DIAGNOSIS — D5700 Hb-SS disease with crisis, unspecified: Principal | ICD-10-CM | POA: Diagnosis present

## 2012-03-12 DIAGNOSIS — R7989 Other specified abnormal findings of blood chemistry: Secondary | ICD-10-CM

## 2012-03-12 DIAGNOSIS — T40605A Adverse effect of unspecified narcotics, initial encounter: Secondary | ICD-10-CM | POA: Diagnosis not present

## 2012-03-12 DIAGNOSIS — E860 Dehydration: Secondary | ICD-10-CM | POA: Diagnosis present

## 2012-03-12 DIAGNOSIS — D5701 Hb-SS disease with acute chest syndrome: Secondary | ICD-10-CM | POA: Diagnosis present

## 2012-03-12 DIAGNOSIS — J984 Other disorders of lung: Secondary | ICD-10-CM | POA: Diagnosis present

## 2012-03-12 DIAGNOSIS — I2789 Other specified pulmonary heart diseases: Secondary | ICD-10-CM | POA: Diagnosis present

## 2012-03-12 DIAGNOSIS — M8700 Idiopathic aseptic necrosis of unspecified bone: Secondary | ICD-10-CM | POA: Diagnosis present

## 2012-03-12 DIAGNOSIS — J45909 Unspecified asthma, uncomplicated: Secondary | ICD-10-CM | POA: Diagnosis present

## 2012-03-12 DIAGNOSIS — R0902 Hypoxemia: Secondary | ICD-10-CM | POA: Diagnosis present

## 2012-03-12 DIAGNOSIS — R109 Unspecified abdominal pain: Secondary | ICD-10-CM

## 2012-03-12 DIAGNOSIS — N179 Acute kidney failure, unspecified: Secondary | ICD-10-CM

## 2012-03-12 LAB — CBC
MCH: 35.6 pg — ABNORMAL HIGH (ref 26.0–34.0)
MCHC: 38.5 g/dL — ABNORMAL HIGH (ref 30.0–36.0)
Platelets: 229 10*3/uL (ref 150–400)
RBC: 2.25 MIL/uL — ABNORMAL LOW (ref 4.22–5.81)
RDW: 23.4 % — ABNORMAL HIGH (ref 11.5–15.5)

## 2012-03-12 LAB — COMPREHENSIVE METABOLIC PANEL
ALT: 32 U/L (ref 0–53)
AST: 80 U/L — ABNORMAL HIGH (ref 0–37)
Albumin: 4.1 g/dL (ref 3.5–5.2)
CO2: 19 mEq/L (ref 19–32)
Calcium: 8.9 mg/dL (ref 8.4–10.5)
Sodium: 135 mEq/L (ref 135–145)
Total Protein: 7.7 g/dL (ref 6.0–8.3)

## 2012-03-12 LAB — RETICULOCYTES: Retic Count, Absolute: 753.8 10*3/uL — ABNORMAL HIGH (ref 19.0–186.0)

## 2012-03-12 MED ORDER — ONDANSETRON HCL 4 MG/2ML IJ SOLN
4.0000 mg | Freq: Four times a day (QID) | INTRAMUSCULAR | Status: DC | PRN
  Administered 2012-03-13 (×2): 4 mg via INTRAVENOUS
  Filled 2012-03-12 (×2): qty 2

## 2012-03-12 MED ORDER — SODIUM CHLORIDE 0.9 % IV BOLUS (SEPSIS)
1000.0000 mL | Freq: Once | INTRAVENOUS | Status: AC
  Administered 2012-03-12: 1000 mL via INTRAVENOUS

## 2012-03-12 MED ORDER — FOLIC ACID 1 MG PO TABS
1.0000 mg | ORAL_TABLET | Freq: Every day | ORAL | Status: DC
  Administered 2012-03-12 – 2012-03-14 (×3): 1 mg via ORAL
  Filled 2012-03-12 (×4): qty 1

## 2012-03-12 MED ORDER — HYDROMORPHONE HCL PF 2 MG/ML IJ SOLN
2.0000 mg | INTRAMUSCULAR | Status: DC | PRN
  Administered 2012-03-12 – 2012-03-13 (×3): 2 mg via INTRAVENOUS
  Filled 2012-03-12 (×4): qty 1

## 2012-03-12 MED ORDER — HYDROMORPHONE HCL PF 1 MG/ML IJ SOLN
1.0000 mg | Freq: Once | INTRAMUSCULAR | Status: AC
  Administered 2012-03-12: 1 mg via INTRAVENOUS
  Filled 2012-03-12: qty 1

## 2012-03-12 MED ORDER — OXYCODONE HCL 5 MG PO TABS
5.0000 mg | ORAL_TABLET | ORAL | Status: DC | PRN
  Administered 2012-03-12: 5 mg via ORAL
  Filled 2012-03-12 (×2): qty 1

## 2012-03-12 MED ORDER — HYDROXYUREA 500 MG PO CAPS
1000.0000 mg | ORAL_CAPSULE | ORAL | Status: DC
  Filled 2012-03-12: qty 2

## 2012-03-12 MED ORDER — DEXTROSE-NACL 5-0.45 % IV SOLN
INTRAVENOUS | Status: DC
  Administered 2012-03-12: 125 mL/h via INTRAVENOUS
  Administered 2012-03-13 – 2012-03-14 (×3): via INTRAVENOUS

## 2012-03-12 MED ORDER — ACETAMINOPHEN 650 MG RE SUPP: 650.0000 mg | Freq: Four times a day (QID) | RECTAL | Status: DC | PRN

## 2012-03-12 MED ORDER — ACETAMINOPHEN 325 MG PO TABS: 650.0000 mg | ORAL_TABLET | Freq: Four times a day (QID) | ORAL | Status: DC | PRN

## 2012-03-12 MED ORDER — SODIUM CHLORIDE 0.9 % IV SOLN
INTRAVENOUS | Status: DC
  Administered 2012-03-12 (×2): via INTRAVENOUS

## 2012-03-12 MED ORDER — ONDANSETRON HCL 4 MG PO TABS: 4.0000 mg | ORAL_TABLET | Freq: Four times a day (QID) | ORAL | Status: DC | PRN

## 2012-03-12 MED ORDER — DIPHENHYDRAMINE HCL 50 MG/ML IJ SOLN
25.0000 mg | Freq: Once | INTRAMUSCULAR | Status: AC
  Administered 2012-03-12: 25 mg via INTRAVENOUS
  Filled 2012-03-12: qty 1

## 2012-03-12 MED ORDER — ONDANSETRON HCL 4 MG/2ML IJ SOLN
4.0000 mg | Freq: Once | INTRAMUSCULAR | Status: AC
  Administered 2012-03-12: 4 mg via INTRAVENOUS
  Filled 2012-03-12: qty 2

## 2012-03-12 MED ORDER — HYDROMORPHONE HCL PF 1 MG/ML IJ SOLN
1.0000 mg | INTRAMUSCULAR | Status: DC | PRN
  Administered 2012-03-12 (×3): 1 mg via INTRAVENOUS
  Filled 2012-03-12 (×4): qty 1

## 2012-03-12 MED ORDER — HYDROXYUREA 500 MG PO CAPS
1500.0000 mg | ORAL_CAPSULE | ORAL | Status: DC
  Administered 2012-03-12: 1500 mg via ORAL
  Filled 2012-03-12: qty 3

## 2012-03-12 MED ORDER — DOCUSATE SODIUM 100 MG PO CAPS
100.0000 mg | ORAL_CAPSULE | Freq: Two times a day (BID) | ORAL | Status: DC
  Filled 2012-03-12 (×4): qty 1

## 2012-03-12 MED ORDER — HEPARIN SODIUM (PORCINE) 5000 UNIT/ML IJ SOLN
5000.0000 [IU] | Freq: Three times a day (TID) | INTRAMUSCULAR | Status: DC
  Filled 2012-03-12 (×7): qty 1

## 2012-03-12 MED ORDER — SENNA 8.6 MG PO TABS
1.0000 | ORAL_TABLET | Freq: Two times a day (BID) | ORAL | Status: DC
  Filled 2012-03-12: qty 1

## 2012-03-12 MED ORDER — HYDROCODONE-ACETAMINOPHEN 5-325 MG PO TABS: 1.0000 | ORAL_TABLET | Freq: Four times a day (QID) | ORAL | Status: DC | PRN

## 2012-03-12 NOTE — ED Provider Notes (Signed)
History     CSN: [id number]  Arrival date & time 03/12/12  0218   First MD Initiated Contact with Patient 03/12/12 0237      Chief Complaint  Patient presents with  . Sickle Cell Pain Crisis    (Consider location/radiation/quality/duration/timing/severity/associated sxs/prior treatment) HPI History provided by patient. Started having worsening chest pain and pain all over tonight. Took Norco at home without relief and presents here for further evaluation. No fevers or chills. No nausea vomiting or diarrhea. Denies cough or difficulty breathing. No sick contacts at home. States this feels like his typical sickle cell pains. Moderate to severe. No known aggravating or alleviating factors. No trauma. No rashes. No new medications. Patient feels like he needs to admitted to the hospital. Past Medical History  Diagnosis Date  . Sickle cell disease, type SS   . Stroke   . Asthma     Past Surgical History  Procedure Date  . Cholecystectomy     Family History  Problem Relation Age of Onset  . Diabetes Sister     History  Substance Use Topics  . Smoking status: Never Smoker   . Smokeless tobacco: Not on file  . Alcohol Use: 6.0 oz/week    10 Shots of liquor per week     socially      Review of Systems  Constitutional: Negative for fever and chills.  HENT: Negative for neck pain and neck stiffness.   Eyes: Negative for pain.  Respiratory: Negative for shortness of breath.   Cardiovascular: Positive for chest pain.  Gastrointestinal: Negative for abdominal pain.  Genitourinary: Negative for dysuria.  Musculoskeletal: Negative for back pain.  Skin: Negative for rash.  Neurological: Negative for headaches.  All other systems reviewed and are negative.    Allergies  Codeine  Home Medications   Current Outpatient Rx  Name Route Sig Dispense Refill  . FOLIC ACID 1 MG PO TABS Oral Take 1 tablet (1 mg total) by mouth daily. 30 tablet 1  . HYDROCODONE-ACETAMINOPHEN  5-325 MG PO TABS Oral Take 1-2 tablets by mouth every 6 (six) hours as needed. For pain 40 tablet 0  . HYDROXYUREA 500 MG PO CAPS Oral Take 2-3 capsules (1,000-1,500 mg total) by mouth daily. Alternates between 2 and 3 tablets each day. 90 capsule 1    BP 130/71  Pulse 79  Temp 98 F (36.7 C) (Oral)  Resp 24  SpO2 95%  Physical Exam  Constitutional: He is oriented to person, place, and time. He appears well-developed and well-nourished.  HENT:  Head: Normocephalic and atraumatic.  Eyes: Conjunctivae and EOM are normal. Pupils are equal, round, and reactive to light.  Neck: Trachea normal. Neck supple. No thyromegaly present.  Cardiovascular: Normal rate, regular rhythm, S1 normal, S2 normal and normal pulses.     No systolic murmur is present   No diastolic murmur is present  Pulses:      Radial pulses are 2+ on the right side, and 2+ on the left side.  Pulmonary/Chest: Effort normal and breath sounds normal. He has no wheezes. He has no rhonchi. He has no rales. He exhibits no tenderness.  Abdominal: Soft. Normal appearance and bowel sounds are normal. There is no tenderness. There is no CVA tenderness and negative Murphy's sign.  Musculoskeletal:       BLE:s Calves nontender, no cords or erythema, negative Homans sign  Neurological: He is alert and oriented to person, place, and time. He has normal strength. No cranial nerve  deficit or sensory deficit. GCS eye subscore is 4. GCS verbal subscore is 5. GCS motor subscore is 6.  Skin: Skin is warm and dry. No rash noted. He is not diaphoretic.  Psychiatric: His speech is normal.       Cooperative and appropriate    ED Course  Procedures (including critical care time)  Results for orders placed during the hospital encounter of 03/12/12  CBC      Component Value Range   WBC 6.9  4.0 - 10.5 K/uL   RBC 2.25 (*) 4.22 - 5.81 MIL/uL   Hemoglobin 8.0 (*) 13.0 - 17.0 g/dL   HCT 16.1 (*) 09.6 - 04.5 %   MCV 88.9  78.0 - 100.0 fL    MCH 35.6 (*) 26.0 - 34.0 pg   MCHC 38.5 (*) 30.0 - 36.0 g/dL   RDW 40.9 (*) 81.1 - 91.4 %   Platelets 229  150 - 400 K/uL  COMPREHENSIVE METABOLIC PANEL      Component Value Range   Sodium 135  135 - 145 mEq/L   Potassium 4.4  3.5 - 5.1 mEq/L   Chloride 102  96 - 112 mEq/L   CO2 19  19 - 32 mEq/L   Glucose, Bld 83  70 - 99 mg/dL   BUN 5 (*) 6 - 23 mg/dL   Creatinine, Ser 7.82  0.50 - 1.35 mg/dL   Calcium 8.9  8.4 - 95.6 mg/dL   Total Protein 7.7  6.0 - 8.3 g/dL   Albumin 4.1  3.5 - 5.2 g/dL   AST 80 (*) 0 - 37 U/L   ALT 32  0 - 53 U/L   Alkaline Phosphatase 73  39 - 117 U/L   Total Bilirubin 13.7 (*) 0.3 - 1.2 mg/dL   GFR calc non Af Amer >90  >90 mL/min   GFR calc Af Amer >90  >90 mL/min  RETICULOCYTES      Component Value Range   Retic Ct Pct 33.5 (*) 0.4 - 3.1 %   RBC. 2.25 (*) 4.22 - 5.81 MIL/uL   Retic Count, Manual 753.8 (*) 19.0 - 186.0 K/uL   Dg Chest Portable 1 View  03/12/2012  *RADIOLOGY REPORT*  Clinical Data: Sickle cell  PORTABLE CHEST - 1 VIEW  Comparison: 02/26/2012  Findings: Heart size upper normal to mildly enlarged.  Interstitial prominence.  No focal consolidation.  No pleural effusion or pneumothorax.  No acute osseous finding.  IMPRESSION: Heart size upper normal to mildly enlarged with interstitial prominence.  No focal consolidation.  Original Report Authenticated By: Waneta Martins, M.D.    IV fluids. Oxygen provided. IV Dilaudid. Labs and imaging obtained and reviewed as above.   MDM   Sickle cell crisis. Nurse's notes reviewed. Documented hypoxia without dyspnea or findings on workup or exam to suggest acute chest syndrome. Afebrile. Case discussed as above with triad hospitalist on-call.  4:49 AM DR Kaylyn Layer agrees to admit, will evaluate in ED, aware of documented hypoxia, holding ABx at this time.       Sunnie Nielsen, MD 03/12/12 [phone]

## 2012-03-12 NOTE — ED Notes (Signed)
Pt c/o sickle cell pain x 1 hour; escalated quickly; sats 88% on arrival; oxygen started

## 2012-03-12 NOTE — H&P (Signed)
PCP:  Dorrene German, MD  Confirmed Willey Blade, MD  Chief Complaint:  Sickle cell body pain  HPI: 25yoM with sickle cell disease, hemolysis, and h/o hypoxia presents with  sickle cell pain crises and hypoxia.   Pt was in usual state of health until 1am when he had bilateral knee  pain that spread to his legs, then developed buttock and chest pain. He  took a Microbiologist but this did not relieve the pain, so came to the ED.  Pain is typical for his sickle cell crises.   In the ED, pt was initially 88% on RA, improved on [place]. Labs with normal  chem panel. AST 80, Tbili 13.7. Normal WBC. Hgb 8.0 stable. Retic count  robust, 753. CXR with mildly enlarged heart and interstitial prominence.   He endorses having felt SOB due to pain, but now improved. No fevers, no  cough, ROS o/w negative.     Past Medical History  Diagnosis Date  . Sickle cell disease, type SS   . Stroke   . Asthma     Past Surgical History  Procedure Date  . Cholecystectomy     Medications:  HOME MEDS: Prior to Admission medications   Medication Sig Start Date End Date Taking? Authorizing Provider  folic acid (FOLVITE) 1 MG tablet Take 1 tablet (1 mg total) by mouth daily. 11/05/11  Yes Keitha Butte, NP  HYDROcodone-acetaminophen (NORCO) 5-325 MG per tablet Take 1-2 tablets by mouth every 6 (six) hours as needed. For pain 11/05/11  Yes Keitha Butte, NP  hydroxyurea (HYDREA) 500 MG capsule Take 2-3 capsules (1,000-1,500 mg total) by mouth daily. Alternates between 2 and 3 tablets each day. 11/05/11  Yes Keitha Butte, NP    Allergies:  Allergies  Allergen Reactions  . Codeine Hives    Social History:   reports that he has never smoked. He does not have any smokeless tobacco history on file. He reports that he drinks about 6 ounces of alcohol per week. He reports that he uses illicit drugs (Marijuana).  Family History: Family History  Problem Relation Age of Onset  . Diabetes Sister       Physical Exam: Filed Vitals:   03/12/12 0226 03/12/12 0228  BP: 130/71   Pulse: 79   Temp: 98 F (36.7 C)   TempSrc: Oral   Resp: 24   SpO2: 88% 95%   Blood pressure 130/71, pulse 79, temperature 98 F (36.7 C), temperature source Oral, resp. rate 24, SpO2 95.00%.  Gen: Young, pleasant, well groomed, dreadlocked M in ED stretcher,  appears well, good historian HEENT: Frank sclera jaundice, but irises clear, pupils round, reactive,  equal. Mouth omist and normal appearing.  Lungs: CTAB no w/c/r, good air movement, no cough, no increased WOB or  accessory muscles Heart: Regular S1/2 noted, with early systolic murmur noted throughtout  and variable splitting of S2 noted Abd: Soft, not tender or tight, no grimacing, scaphoid, normal exam Extrem: Thin, warm, perfusing well, no BLE edema noted, radials palpable Neuro: Alert, attentive, conversant, CN 2-12 intact, moves extremities  on his own, grossly non-focal.    Labs & Imaging Results for orders placed during the hospital encounter of 03/12/12 (from the past 48 hour(s))  CBC     Status: Abnormal   Collection Time   03/12/12  3:10 AM      Component Value Range Comment   WBC 6.9  4.0 - 10.5 K/uL WHITE COUNT CONFIRMED ON SMEAR   RBC  2.25 (*) 4.22 - 5.81 MIL/uL    Hemoglobin 8.0 (*) 13.0 - 17.0 g/dL REPEATED TO VERIFY   HCT 20.0 (*) 39.0 - 52.0 %    MCV 88.9  78.0 - 100.0 fL    MCH 35.6 (*) 26.0 - 34.0 pg    MCHC 38.5 (*) 30.0 - 36.0 g/dL    RDW 01.0 (*) 27.2 - 15.5 %    Platelets 229  150 - 400 K/uL   COMPREHENSIVE METABOLIC PANEL     Status: Abnormal   Collection Time   03/12/12  3:10 AM      Component Value Range Comment   Sodium 135  135 - 145 mEq/L    Potassium 4.4  3.5 - 5.1 mEq/L    Chloride 102  96 - 112 mEq/L    CO2 19  19 - 32 mEq/L    Glucose, Bld 83  70 - 99 mg/dL    BUN 5 (*) 6 - 23 mg/dL    Creatinine, Ser 5.36  0.50 - 1.35 mg/dL ICTERUS AT THIS LEVEL MAY AFFECT RESULT   Calcium 8.9  8.4 - 10.5 mg/dL     Total Protein 7.7  6.0 - 8.3 g/dL    Albumin 4.1  3.5 - 5.2 g/dL    AST 80 (*) 0 - 37 U/L    ALT 32  0 - 53 U/L    Alkaline Phosphatase 73  39 - 117 U/L    Total Bilirubin 13.7 (*) 0.3 - 1.2 mg/dL    GFR calc non Af Amer >90  >90 mL/min    GFR calc Af Amer >90  >90 mL/min   RETICULOCYTES     Status: Abnormal   Collection Time   03/12/12  3:10 AM      Component Value Range Comment   Retic Ct Pct 33.5 (*) 0.4 - 3.1 % RESULTS CONFIRMED BY MANUAL DILUTION   RBC. 2.25 (*) 4.22 - 5.81 MIL/uL    Retic Count, Manual 753.8 (*) 19.0 - 186.0 K/uL    Dg Chest Portable 1 View  03/12/2012  *RADIOLOGY REPORT*  Clinical Data: Sickle cell  PORTABLE CHEST - 1 VIEW  Comparison: 02/26/2012  Findings: Heart size upper normal to mildly enlarged.  Interstitial prominence.  No focal consolidation.  No pleural effusion or pneumothorax.  No acute osseous finding.  IMPRESSION: Heart size upper normal to mildly enlarged with interstitial prominence.  No focal consolidation.  Original Report Authenticated By: Waneta Martins, M.D.    Impression Present on Admission:  .Sickle cell disease .Sickle cell crisis .Hypoxia   25yoM with sickle cell disease, hemolysis, and h/o hypoxia presents with  sickle cell pain crises and hypoxia.   1. Sickle cell crisis: Hgb is stable and retic count robust. CXR overall  clear, despite minimal hypoxia on arrival. Seems quite stable at  present, would be good candidate for Sick[facility] Observation Unit but they're  full, so admit Obs to hospital.  - Observation status, pain control, oxygen, IVF's. - Please alert Dr. August Saucer to his admission in the am  - Continue folate, hydroxyurea   2. Hypoxia: Pt initially felt SOB due to pain but now improved and  stable on minimal [place]. Monitoring for now  SubQ heparin Regular bed, [facility] team 6 Presumed full code   [facility], Kimbery Harwood 03/12/2012, 5:44 AM

## 2012-03-12 NOTE — ED Notes (Signed)
Pt refused rectal temp.

## 2012-03-12 NOTE — Discharge Planning (Signed)
Discharge Summary  Philip Rios MR#: [id number]  DOB:Mar 18, 1986  Date of Admission: 03/12/2012 Date of Discharge: 03/12/2012  Patient's PCP: Dorrene German, MD  Attending Physician:Talayla Doyel, Orvan Seen  Consults:   Dr. August Saucer  Discharge Diagnoses: Principal Problem:  *Sickle cell crisis Active Problems:  Hypoxia  Sickle cell disease   Medication List  As of 03/12/2012  5:26 PM   ASK your doctor about these medications         folic acid 1 MG tablet   Commonly known as: FOLVITE   Take 1 tablet (1 mg total) by mouth daily.      HYDROcodone-acetaminophen 5-325 MG per tablet   Commonly known as: NORCO   Take 1-2 tablets by mouth every 6 (six) hours as needed. For pain      hydroxyurea 500 MG capsule   Commonly known as: HYDREA   Take 2-3 capsules (1,000-1,500 mg total) by mouth daily. Alternates between 2 and 3 tablets each day.            Hospital Course: Sickle cell crisis Present on Admission:  .Sickle cell disease .Sickle cell crisis .Hypoxia     BP 100/59  Pulse 71  Temp 97.8 F (36.6 C) (Oral)  Resp 20  Ht 6' (1.829 m)  Wt 65.1 kg (143 lb 8.3 oz)  BMI 19.46 kg/m2  SpO2 89%  Gen: appears well, good historian  HEENT: sclera jaundice, but irises clear, pupils round, reactive, equal. Lip piercing  Lungs: CTAB no wheezes rubs crackles  good air movement, no cough,  Heart: Regular S1/2 noted, with early systolic murmur noted throughtout  and variable splitting of S2 noted  Abd: Soft, not tender Extrem: Thin, warm, no edema Skin- numerous tatoos  Neuro: Alert, oriented  conversant, CN 2-12 intact, moves extremities  on his own, grossly non-focal.   Results for orders placed during the hospital encounter of 03/12/12 (from the past 48 hour(s))  CBC     Status: Abnormal   Collection Time   03/12/12  3:10 AM      Component Value Range Comment   WBC 6.9  4.0 - 10.5 K/uL WHITE COUNT CONFIRMED ON SMEAR   RBC 2.25 (*) 4.22 - 5.81 MIL/uL    Hemoglobin 8.0 (*) 13.0 - 17.0 g/dL REPEATED TO VERIFY   HCT 20.0 (*) 39.0 - 52.0 %    MCV 88.9  78.0 - 100.0 fL    MCH 35.6 (*) 26.0 - 34.0 pg    MCHC 38.5 (*) 30.0 - 36.0 g/dL    RDW 52.8 (*) 41.3 - 15.5 %    Platelets 229  150 - 400 K/uL   COMPREHENSIVE METABOLIC PANEL     Status: Abnormal   Collection Time   03/12/12  3:10 AM      Component Value Range Comment   Sodium 135  135 - 145 mEq/L    Potassium 4.4  3.5 - 5.1 mEq/L    Chloride 102  96 - 112 mEq/L    CO2 19  19 - 32 mEq/L    Glucose, Bld 83  70 - 99 mg/dL    BUN 5 (*) 6 - 23 mg/dL    Creatinine, Ser 2.44  0.50 - 1.35 mg/dL ICTERUS AT THIS LEVEL MAY AFFECT RESULT   Calcium 8.9  8.4 - 10.5 mg/dL    Total Protein 7.7  6.0 - 8.3 g/dL    Albumin 4.1  3.5 - 5.2 g/dL    AST 80 (*) 0 - 37 U/L  ALT 32  0 - 53 U/L    Alkaline Phosphatase 73  39 - 117 U/L    Total Bilirubin 13.7 (*) 0.3 - 1.2 mg/dL    GFR calc non Af Amer >90  >90 mL/min    GFR calc Af Amer >90  >90 mL/min   RETICULOCYTES     Status: Abnormal   Collection Time   03/12/12  3:10 AM      Component Value Range Comment   Retic Ct Pct 33.5 (*) 0.4 - 3.1 % RESULTS CONFIRMED BY MANUAL DILUTION   RBC. 2.25 (*) 4.22 - 5.81 MIL/uL    Retic Count, Manual 753.8 (*) 19.0 - 186.0 K/uL     Dg Chest 2 View  02/26/2012  *RADIOLOGY REPORT*  Clinical Data: Sickle cell crisis.  CHEST - 2 VIEW  Comparison: 01/04/2012  Findings: Mild hyperinflation of the lungs. Heart and mediastinal contours are within normal limits.  No focal opacities or effusions.  No acute bony abnormality.  IMPRESSION: No active cardiopulmonary disease.  Hyperinflation.  Original Report Authenticated By: Cyndie Chime, M.D.   Dg Chest Portable 1 View  03/12/2012  *RADIOLOGY REPORT*  Clinical Data: Sickle cell  PORTABLE CHEST - 1 VIEW  Comparison: 02/26/2012  Findings: Heart size upper normal to mildly enlarged.  Interstitial prominence.  No focal consolidation.  No pleural effusion or pneumothorax.  No acute  osseous finding.  IMPRESSION: Heart size upper normal to mildly enlarged with interstitial prominence.  No focal consolidation.  Original Report Authenticated By: Waneta Martins, M.D.   Disposition: Home  Call Dr. August Saucer for follow up appointment in 1 week [phone]  Diet: Regular   Activity: as tolerated  Discharged canceled advised patient to move around before leaving nurse assisted him walking in the hall he became very light headed than dizzy with pain being an 0 to increasing to a 3. Patient in agreement to be observed for 24hrs review labs in AM than revisit d/c Signed: Stesha Neyens PAULETTE 03/12/2012, 5:26 PM

## 2012-03-13 ENCOUNTER — Inpatient Hospital Stay (HOSPITAL_COMMUNITY): Payer: Federal, State, Local not specified - PPO

## 2012-03-13 DIAGNOSIS — R109 Unspecified abdominal pain: Secondary | ICD-10-CM

## 2012-03-13 DIAGNOSIS — N179 Acute kidney failure, unspecified: Secondary | ICD-10-CM

## 2012-03-13 DIAGNOSIS — E875 Hyperkalemia: Secondary | ICD-10-CM

## 2012-03-13 DIAGNOSIS — R7989 Other specified abnormal findings of blood chemistry: Secondary | ICD-10-CM

## 2012-03-13 LAB — LACTIC ACID, PLASMA: Lactic Acid, Venous: 6.5 mmol/L — ABNORMAL HIGH (ref 0.5–2.2)

## 2012-03-13 LAB — CBC
MCH: 34.9 pg — ABNORMAL HIGH (ref 26.0–34.0)
MCHC: >38.5 g/dL — ABNORMAL HIGH (ref 30.0–36.0)
Platelets: 159 10*3/uL (ref 150–400)
Platelets: 164 10*3/uL (ref 150–400)
RBC: 1.69 MIL/uL — ABNORMAL LOW (ref 4.22–5.81)
RDW: 21 % — ABNORMAL HIGH (ref 11.5–15.5)
WBC: 22 10*3/uL — ABNORMAL HIGH (ref 4.0–10.5)

## 2012-03-13 LAB — BASIC METABOLIC PANEL
BUN: 11 mg/dL (ref 6–23)
Calcium: 8.3 mg/dL — ABNORMAL LOW (ref 8.4–10.5)
Creatinine, Ser: 1.86 mg/dL — ABNORMAL HIGH (ref 0.50–1.35)
GFR calc Af Amer: 57 mL/min — ABNORMAL LOW (ref >90)
GFR calc non Af Amer: 50 mL/min — ABNORMAL LOW (ref >90)

## 2012-03-13 LAB — MRSA PCR SCREENING: MRSA by PCR: NEGATIVE

## 2012-03-13 LAB — COMPREHENSIVE METABOLIC PANEL
CO2: 19 mEq/L (ref 19–32)
Calcium: 8.5 mg/dL (ref 8.4–10.5)
Creatinine, Ser: 2.55 mg/dL — ABNORMAL HIGH (ref 0.50–1.35)
GFR calc Af Amer: 39 mL/min — ABNORMAL LOW (ref >90)
GFR calc non Af Amer: 34 mL/min — ABNORMAL LOW (ref >90)
Glucose, Bld: 130 mg/dL — ABNORMAL HIGH (ref 70–99)

## 2012-03-13 LAB — PHOSPHORUS: Phosphorus: 4.3 mg/dL (ref 2.3–4.6)

## 2012-03-13 LAB — AMMONIA

## 2012-03-13 MED ORDER — SODIUM CHLORIDE 0.9 % IV SOLN
500.0000 mg | Freq: Once | INTRAVENOUS | Status: AC
  Administered 2012-03-13: 500 mg via INTRAVENOUS
  Filled 2012-03-13: qty 500

## 2012-03-13 MED ORDER — KETOROLAC TROMETHAMINE 30 MG/ML IJ SOLN
15.0000 mg | Freq: Three times a day (TID) | INTRAMUSCULAR | Status: DC
  Administered 2012-03-13: 15 mg via INTRAVENOUS
  Filled 2012-03-13: qty 1

## 2012-03-13 MED ORDER — LEVOFLOXACIN 750 MG PO TABS
750.0000 mg | ORAL_TABLET | ORAL | Status: DC
  Administered 2012-03-13: 750 mg via ORAL
  Filled 2012-03-13: qty 1

## 2012-03-13 MED ORDER — PANTOPRAZOLE SODIUM 40 MG PO TBEC
40.0000 mg | DELAYED_RELEASE_TABLET | Freq: Two times a day (BID) | ORAL | Status: DC
  Administered 2012-03-14 – 2012-03-15 (×3): 40 mg via ORAL
  Filled 2012-03-13 (×5): qty 1

## 2012-03-13 MED ORDER — SODIUM CHLORIDE 0.9 % IV SOLN
250.0000 mg | Freq: Four times a day (QID) | INTRAVENOUS | Status: DC
  Administered 2012-03-14: 250 mg via INTRAVENOUS
  Filled 2012-03-13 (×2): qty 250

## 2012-03-13 MED ORDER — LIDOCAINE HCL 1 % IJ SOLN
INTRAMUSCULAR | Status: AC
  Filled 2012-03-13: qty 20

## 2012-03-13 MED ORDER — HYDROMORPHONE HCL PF 1 MG/ML IJ SOLN
1.0000 mg | INTRAMUSCULAR | Status: DC | PRN
  Administered 2012-03-13 – 2012-03-14 (×6): 1 mg via INTRAVENOUS
  Filled 2012-03-13 (×7): qty 1

## 2012-03-13 MED ORDER — HYDROMORPHONE HCL PF 1 MG/ML IJ SOLN: 1.0000 mg | INTRAMUSCULAR | Status: DC | PRN

## 2012-03-13 MED ORDER — DIPHENHYDRAMINE HCL 50 MG/ML IJ SOLN
12.5000 mg | Freq: Once | INTRAMUSCULAR | Status: AC
  Administered 2012-03-13: 12.5 mg via INTRAVENOUS
  Filled 2012-03-13: qty 1

## 2012-03-13 MED ORDER — LEVOFLOXACIN 750 MG PO TABS
750.0000 mg | ORAL_TABLET | Freq: Every day | ORAL | Status: DC
  Filled 2012-03-13: qty 1

## 2012-03-13 MED ORDER — LIDOCAINE 5 % EX PTCH
2.0000 | MEDICATED_PATCH | CUTANEOUS | Status: DC
  Filled 2012-03-13 (×3): qty 2

## 2012-03-13 MED ORDER — NALOXONE HCL 0.4 MG/ML IJ SOLN
INTRAMUSCULAR | Status: AC
  Filled 2012-03-13: qty 1

## 2012-03-13 MED ORDER — NALOXONE HCL 0.4 MG/ML IJ SOLN
0.4000 mg | INTRAMUSCULAR | Status: DC | PRN
  Administered 2012-03-13: 0.4 mg via INTRAVENOUS

## 2012-03-13 MED ORDER — DIPHENHYDRAMINE HCL 50 MG/ML IJ SOLN: 12.5000 mg | Freq: Four times a day (QID) | INTRAMUSCULAR | Status: DC | PRN

## 2012-03-13 MED ORDER — ONDANSETRON HCL 4 MG/2ML IJ SOLN: 4.0000 mg | INTRAMUSCULAR | Status: DC | PRN

## 2012-03-13 MED ORDER — ENOXAPARIN SODIUM 40 MG/0.4ML ~~LOC~~ SOLN
40.0000 mg | SUBCUTANEOUS | Status: DC
  Filled 2012-03-13: qty 0.4

## 2012-03-13 MED ORDER — ONDANSETRON HCL 4 MG PO TABS
4.0000 mg | ORAL_TABLET | Freq: Four times a day (QID) | ORAL | Status: DC | PRN
  Administered 2012-03-15: 4 mg via ORAL
  Filled 2012-03-13: qty 1

## 2012-03-13 MED ORDER — AZITHROMYCIN 500 MG PO TABS: 500.0000 mg | ORAL_TABLET | Freq: Every day | ORAL | Status: DC

## 2012-03-13 MED ORDER — ACETAMINOPHEN 325 MG PO TABS
650.0000 mg | ORAL_TABLET | Freq: Once | ORAL | Status: AC
  Administered 2012-03-13: 650 mg via ORAL
  Filled 2012-03-13: qty 2

## 2012-03-13 MED ORDER — SODIUM POLYSTYRENE SULFONATE 15 GM/60ML PO SUSP
15.0000 g | Freq: Once | ORAL | Status: AC
  Administered 2012-03-13: 15 g via ORAL
  Filled 2012-03-13: qty 60

## 2012-03-13 MED ORDER — OXYCODONE HCL 5 MG PO TABS
5.0000 mg | ORAL_TABLET | ORAL | Status: DC | PRN
  Administered 2012-03-14: 5 mg via ORAL
  Filled 2012-03-13: qty 1

## 2012-03-13 MED ORDER — HYOSCYAMINE SULFATE ER 0.375 MG PO TB12
0.3750 mg | ORAL_TABLET | Freq: Two times a day (BID) | ORAL | Status: DC
  Administered 2012-03-13 – 2012-03-15 (×4): 0.375 mg via ORAL
  Filled 2012-03-13 (×8): qty 1

## 2012-03-13 MED ORDER — LEVOFLOXACIN IN D5W 750 MG/150ML IV SOLN: 750.0000 mg | INTRAVENOUS | Status: DC

## 2012-03-13 MED ORDER — DICLOFENAC SODIUM 1 % TD GEL
Freq: Four times a day (QID) | TRANSDERMAL | Status: DC
  Filled 2012-03-13: qty 100

## 2012-03-13 NOTE — Progress Notes (Signed)
Subjective: Received a call for a critical Hemoglobin (5.9)  from the patient's RN.  A transfusion of 3 units of PRBCs was ordered at that time. Received another call that the patient had an O2 sat of 91% on 4 LPM of O2 via [place] and was too drowsy to sign the consent for blood transfusion.   ABG, non-rebreather, Narcan and numerous laboratories were ordered. The patient was then seen on rounds today.  The patient was laying in his bed, moaning and complaining of abdominal pain.  The patient is complaining of pain 10/10 in his abdominal area.  The patient's nurse also stated that the patient had a large episode of emesis. Abdominal US, PICC line, telemetry monitoring, continuous pulse-ox and transfer to step-down unit ordered.  The patient will start antibiotic coverage for possible sepsis with PO antibiotics until PICC line is received. The patient is currently receiving a unit of PRBCs.    No other nursing or patient concerns at this time.   Objective: Vital signs in last 24 hours: Blood pressure 133/75, pulse 87, temperature 97.8 F (36.6 C), temperature source Oral, resp. rate 16, height 6' (1.829 m), weight 143 lb 8.3 oz (65.1 kg), SpO2 92.00%.  General Appearance: Delirious, slowed mentation, well developed, well nourished, severe distress Head: Normocephalic, without obvious abnormality, atraumatic Eyes: PERRLA, EOMI, severely icteric sclera Nose: Nares, septum and mucosa are normal, no drainage or sinus tenderness Throat: Dry lips and tongue, teeth and gums in fair condition Neck: No adenopathy, supple, symmetrical, trachea midline, thyroid not enlarged, symmetric, no tenderness Back: Symmetric, unable to fully assess RO, no CVA tenderness Resp: Diminished breath sounds bibasilar and bilaterally, CTA no wheezes/rales/rhonchi  Cardio: Regular rate and rhythm, S1, S2 normal, no murmur/click/rub/gallop, distant heart sounds GI: Firm, diffuse tenderness, distended, hypoactive bowel sounds, unable  to fully assess organomegaly Male Genitalia: Deferred, the patient denies priapism Extremities: Extremities normal, atraumatic, no cyanosis or edema, Homans sign is negative, no sign of DVT Pulses: 2+ and symmetric Skin: Dry skin, icteric color, no rashes or lesions, several piercings Neurologic: AO x 2 at times, unable to fully assess Psych:  Visible distress due to pain  Lab Results: Results for orders placed during the hospital encounter of 03/12/12 (from the past 24 hour(s))  BASIC METABOLIC PANEL     Status: Abnormal   Collection Time   03/13/12  5:30 AM      Component Value Range   Sodium 134 (*) 135 - 145 mEq/L   Potassium 5.0  3.5 - 5.1 mEq/L   Chloride 100  96 - 112 mEq/L   CO2 18 (*) 19 - 32 mEq/L   Glucose, Bld 188 (*) 70 - 99 mg/dL   BUN 11  6 - 23 mg/dL   Creatinine, Ser 1.61 (*) 0.50 - 1.35 mg/dL   Calcium 8.3 (*) 8.4 - 10.5 mg/dL   GFR calc non Af Amer 50 (*) >90 mL/min   GFR calc Af Amer 57 (*) >90 mL/min  CBC     Status: Abnormal   Collection Time   03/13/12  5:30 AM      Component Value Range   WBC 20.3 (*) 4.0 - 10.5 K/uL   RBC 1.56 (*) 4.22 - 5.81 MIL/uL   Hemoglobin 5.3 (*) 13.0 - 17.0 g/dL   HCT 09.6 (*) 04.5 - 40.9 %   MCV 80.8  78.0 - 100.0 fL   MCH 34.0  26.0 - 34.0 pg   MCHC >38.5 (*) 30.0 - 36.0 g/dL  RDW 21.0 (*) 11.5 - 15.5 %   Platelets 159  150 - 400 K/uL  CBC     Status: Abnormal   Collection Time   03/13/12  8:05 AM      Component Value Range   WBC 22.0 (*) 4.0 - 10.5 K/uL   RBC 1.69 (*) 4.22 - 5.81 MIL/uL   Hemoglobin 5.9 (*) 13.0 - 17.0 g/dL   HCT 21.3 (*) 08.6 - 57.8 %   MCV 83.4  78.0 - 100.0 fL   MCH 34.9 (*) 26.0 - 34.0 pg   MCHC >38.5 (*) 30.0 - 36.0 g/dL   RDW 46.9 (*) 62.9 - 52.8 %   Platelets 164  150 - 400 K/uL  TYPE AND SCREEN     Status: Normal (Preliminary result)   Collection Time   03/13/12  9:30 AM      Component Value Range   ABO/RH(D) O NEG     Antibody Screen NEG     Sample Expiration 03/16/2012     Unit Number  [id number]     Blood Component Type RED CELLS,LR     Unit division 00     Status of Unit ISSUED     Donor AG Type       Value: NEGATIVE FOR E ANTIGEN NEGATIVE FOR C ANTIGEN NEGATIVE FOR KELL ANTIGEN   Transfusion Status OK TO TRANSFUSE     Crossmatch Result Compatible     Unit Number [id number]     Blood Component Type RED CELLS,LR     Unit division 00     Status of Unit ALLOCATED     Donor AG Type PENDING     Transfusion Status OK TO TRANSFUSE     Crossmatch Result Compatible    BLOOD GAS, ARTERIAL     Status: Abnormal   Collection Time   03/13/12 10:21 AM      Component Value Range   O2 Content 4.0     Delivery systems NASAL CANNULA     pH, Arterial 7.264 (*) 7.350 - 7.450   pCO2 arterial 45.2 (*) 35.0 - 45.0 mmHg   Bicarbonate 19.8 (*) 20.0 - 24.0 mEq/L   TCO2 20.0  0 - 100 mmol/L   Acid-base deficit 6.1 (*) 0.0 - 2.0 mmol/L   O2 Saturation 90.9     Patient temperature 98.6     Collection site RIGHT RADIAL     Drawn by [id number]     Sample type ARTERIAL     Allens test (pass/fail) PASS  PASS  COMPREHENSIVE METABOLIC PANEL     Status: Abnormal   Collection Time   03/13/12 11:25 AM      Component Value Range   Sodium 135  135 - 145 mEq/L   Potassium 6.1 (*) 3.5 - 5.1 mEq/L   Chloride 102  96 - 112 mEq/L   CO2 19  19 - 32 mEq/L   Glucose, Bld 130 (*) 70 - 99 mg/dL   BUN 18  6 - 23 mg/dL   Creatinine, Ser 5.95 (*) 0.50 - 1.35 mg/dL   Calcium 8.5  8.4 - 63.8 mg/dL   Total Protein 6.7  6.0 - 8.3 g/dL   Albumin 3.4 (*) 3.5 - 5.2 g/dL   AST 756 (*) 0 - 37 U/L   ALT 41  0 - 53 U/L   Alkaline Phosphatase 54  39 - 117 U/L   Total Bilirubin 18.8 (*) 0.3 - 1.2 mg/dL   GFR calc non Af Amer 34 (*) >  90 mL/min   GFR calc Af Amer 39 (*) >90 mL/min  MAGNESIUM     Status: Normal   Collection Time   03/13/12 11:25 AM      Component Value Range   Magnesium 1.7  1.5 - 2.5 mg/dL  PHOSPHORUS     Status: Normal   Collection Time   03/13/12 11:25 AM      Component Value Range   Phosphorus  4.3  2.3 - 4.6 mg/dL  LACTIC ACID, PLASMA     Status: Abnormal   Collection Time   03/13/12 11:25 AM      Component Value Range   Lactic Acid, Venous 6.5 (*) 0.5 - 2.2 mmol/L  PRO B NATRIURETIC PEPTIDE     Status: Abnormal   Collection Time   03/13/12 11:25 AM      Component Value Range   Pro B Natriuretic peptide (BNP) 9075.0 (*) 0 - 125 pg/mL    Studies/Results: Dg Chest Portable 1 View  03/12/2012  *RADIOLOGY REPORT*  Clinical Data: Sickle cell  PORTABLE CHEST - 1 VIEW  Comparison: 02/26/2012  Findings: Heart size upper normal to mildly enlarged.  Interstitial prominence.  No focal consolidation.  No pleural effusion or pneumothorax.  No acute osseous finding.  IMPRESSION: Heart size upper normal to mildly enlarged with interstitial prominence.  No focal consolidation.  Original Report Authenticated By: Waneta Martins, M.D.     Medications:  Allergies  Allergen Reactions  . Codeine Hives     Current Facility-Administered Medications  Medication Dose Route Frequency Provider Last Rate Last Dose  . acetaminophen (TYLENOL) tablet 650 mg  650 mg Oral Q6H PRN Devonne Doughty, MD       Or  . acetaminophen (TYLENOL) suppository 650 mg  650 mg Rectal Q6H PRN Devonne Doughty, MD      . acetaminophen (TYLENOL) tablet 650 mg  650 mg Oral Once Keitha Butte, NP   650 mg at 03/13/12 1058  . dextrose 5 %-0.45 % sodium chloride infusion   Intravenous Continuous Keitha Butte, NP 125 mL/hr at 03/13/12 0256    . diphenhydrAMINE (BENADRYL) injection 12.5 mg  12.5 mg Intravenous Once Keitha Butte, NP   12.5 mg at 03/13/12 1059  . diphenhydrAMINE (BENADRYL) injection 12.5 mg  12.5 mg Intravenous Q6H PRN Keitha Butte, NP      . folic acid (FOLVITE) tablet 1 mg  1 mg Oral Daily Devonne Doughty, MD   1 mg at 03/13/12 1303  . HYDROmorphone (DILAUDID) injection 1 mg  1 mg Intravenous Q2H PRN Keitha Butte, NP      . hyoscyamine (LEVBID) 0.375 MG 12 hr tablet 0.375 mg  0.375 mg  Oral Q12H Keitha Butte, NP   0.375 mg at 03/13/12 1351  . levofloxacin (LEVAQUIN) tablet 750 mg  750 mg Oral Q48H Keitha Butte, NP   750 mg at 03/13/12 1357  . lidocaine (LIDODERM) 5 % 2 patch  2 patch Transdermal Q24H Keitha Butte, NP      . lidocaine (XYLOCAINE) 1 % (with pres) injection           . naloxone (NARCAN) 0.4 MG/ML injection           . naloxone [facility]) injection 0.4 mg  0.4 mg Intravenous PRN Keitha Butte, NP   0.4 mg at 03/13/12 1010  . ondansetron (ZOFRAN) injection 4 mg  4 mg Intravenous Q4H PRN Keitha Butte, NP  Or  . ondansetron (ZOFRAN) tablet 4 mg  4 mg Oral Q6H PRN Keitha Butte, NP      . oxyCODONE (Oxy IR/ROXICODONE) immediate release tablet 5 mg  5 mg Oral Q4H PRN Keitha Butte, NP      . pantoprazole (PROTONIX) EC tablet 40 mg  40 mg Oral BID WC Keitha Butte, NP      . sodium polystyrene (KAYEXALATE) 15 GM/60ML suspension 15 g  15 g Oral Once Keitha Butte, NP   15 g at 03/13/12 1301  . DISCONTD: 0.9 %  sodium chloride infusion   Intravenous Continuous Devonne Doughty, MD 125 mL/hr at 03/12/12 1359    . DISCONTD: azithromycin (ZITHROMAX) tablet 500 mg  500 mg Oral Daily Keitha Butte, NP      . DISCONTD: diclofenac sodium (VOLTAREN) 1 % transdermal gel   Topical QID Keitha Butte, NP      . DISCONTD: docusate sodium (COLACE) capsule 100 mg  100 mg Oral BID Devonne Doughty, MD      . DISCONTD: enoxaparin (LOVENOX) injection 40 mg  40 mg Subcutaneous Q24H Keitha Butte, NP      . DISCONTD: heparin injection 5,000 Units  5,000 Units Subcutaneous Q8H Devonne Doughty, MD      . DISCONTD: HYDROmorphone (DILAUDID) injection 1 mg  1 mg Intravenous Q4H PRN Devonne Doughty, MD   1 mg at 03/12/12 1852  . DISCONTD: HYDROmorphone (DILAUDID) injection 1 mg  1 mg Intravenous Q3H PRN Keitha Butte, NP      . DISCONTD: HYDROmorphone (DILAUDID) injection 2 mg  2 mg Intravenous Q3H PRN Gwenyth Bender, MD   2 mg at  03/13/12 0709  . DISCONTD: hydroxyurea (HYDREA) capsule 1,000 mg  1,000 mg Oral QODAY Devonne Doughty, MD      . DISCONTD: hydroxyurea (HYDREA) capsule 1,500 mg  1,500 mg Oral QODAY Devonne Doughty, MD   1,500 mg at 03/12/12 1111  . DISCONTD: ketorolac (TORADOL) 30 MG/ML injection 15 mg  15 mg Intravenous Q8H Keitha Butte, NP   15 mg at 03/13/12 1301  . DISCONTD: levofloxacin (LEVAQUIN) tablet 750 mg  750 mg Oral Daily Keitha Butte, NP      . DISCONTD: ondansetron (ZOFRAN) injection 4 mg  4 mg Intravenous Q6H PRN Devonne Doughty, MD   4 mg at 03/13/12 1026  . DISCONTD: ondansetron (ZOFRAN) tablet 4 mg  4 mg Oral Q6H PRN Devonne Doughty, MD      . DISCONTD: oxyCODONE (Oxy IR/ROXICODONE) immediate release tablet 5 mg  5 mg Oral Q4H PRN Devonne Doughty, MD   5 mg at 03/12/12 0753  . DISCONTD: senna (SENOKOT) tablet 8.6 mg  1 tablet Oral BID Devonne Doughty, MD        Assessment/Plan: Patient Active Problem List  Diagnosis  . Left foot pain  . Hypoxia  . Sickle cell disease  . Osteonecrosis  . Marijuana use  . Sickle cell crisis  . Hyperkalemia  . Abdominal pain  . Elevated brain natriuretic peptide (BNP) level  . Acute renal failure  . Hemochromatosis    Sickle Cell Crisis:  The patient is scheduled to receive a PICC line.  The patient is already being transfused 1 unit of PRBC's and is scheduled to receive a total of 3 units of PRBCs.  DVT prophylaxis.  The patient will be transferred to the step-down unit for further care.  Ammonia,  CMP, Mg, Phos, Ferritin and Hemoglobinopathy are pending.  CMP and CBC in the am. Possible Sepsis:  The patient will be started on antibiotic therapy with PO Levaquin (per pharmacy dosing). Once PICC line is received, antibiotic therapy will be adjusted for broader coverage.  Pan cultures ordered (blood culture may be drawn at a later time, the patient is currently receiving a blood transfusion) Hyperkalemia:  The patient is scheduled to receive Kayexalate and  will continue telemetry monitoring. Abdominal pain:  The patient is scheduled to receive an abdominal US, Levbid ordered BID, GI prophylaxis with Protonix BID Elevated BNP:  IVF's adjusted to [facility], 2D Echo ordered (Dr. Sharyn Lull, Cardiologist to read), CE's may be drawn at a later time, but the patient is currently receiving a blood transfusion. Hypoxemia:  The patient is currently receiving high-flow O2 with a non-rebreather at 15 LPM Acute Renal Failure:  Will continue to monitor Hemochromatosis (mild):  Will recheck Ferritin in the am  Discussed and agreed upon plan of care with the patient.   The plan of care will be adjusted based on the patient's clinical progress.   Larina Bras, NP-C 03/13/2012, 12:03 PM

## 2012-03-13 NOTE — Progress Notes (Signed)
Patient's repeat hemoglogin and hematocrit this am 5.9 and 14.1.  PA for Dr. Richardo Hanks made aware.  Patient to be transfused with 3 units of blood. Patient aware.  Will continue to monitor.

## 2012-03-13 NOTE — Progress Notes (Signed)
ANTIBIOTIC CONSULT NOTE - INITIAL  Pharmacy Consult for Levaquin Indication: R/O Sepsis  Allergies  Allergen Reactions  . Codeine Hives    Patient Measurements: Height: 6' (182.9 cm) Weight: 143 lb 8.3 oz (65.1 kg) IBW/kg (Calculated) : 77.6    Vital Signs: Temp: 98.7 F (37.1 C) (06/13 1359) Temp src: Oral (06/13 1359) BP: 135/82 mmHg (06/13 1359) Pulse Rate: 86  (06/13 1359) Intake/Output from previous day: 06/12 0701 - 06/13 0700 In: 1391.7 [I.V.:1391.7] Out: -  Intake/Output from this shift: Total I/O In: 2025 [I.V.:1375; Blood:650] Out: -   Labs:  Basename 03/13/12 1125 03/13/12 0805 03/13/12 0530 03/12/12 0310  WBC -- 22.0* 20.3* 6.9  HGB -- 5.9* 5.3* 8.0*  PLT -- 164 159 229  LABCREA -- -- -- --  CREATININE 2.55* -- 1.86* 0.57   Estimated Creatinine Clearance: 41.5 ml/min (by C-G formula based on Cr of 2.55).    Microbiology: No results found for this or any previous visit (from the past 720 hour(s)).  Medical History: Past Medical History  Diagnosis Date  . Sickle cell disease, type SS   . Stroke   . Asthma     Medications:  Anti-infectives     Start     Dose/Rate Route Frequency Ordered Stop   03/15/12 1400   levofloxacin (LEVAQUIN) IVPB 750 mg        750 mg 100 mL/hr over 90 Minutes Intravenous Every 48 hours 03/13/12 1643     03/13/12 1400   levofloxacin (LEVAQUIN) tablet 750 mg  Status:  Discontinued        750 mg Oral Daily 03/13/12 1212 03/13/12 1240   03/13/12 1400   levofloxacin (LEVAQUIN) tablet 750 mg  Status:  Discontinued        750 mg Oral Every 48 hours 03/13/12 1240 03/13/12 1635   03/13/12 1230   azithromycin (ZITHROMAX) tablet 500 mg  Status:  Discontinued        500 mg Oral Daily 03/13/12 1219 03/13/12 1238         Assessment: 25 YOM w/ SS. Renal failure, r/o sepsis. Pharmacy asked to dose Levaquin. Started on Levaquin po 750mg  q48h today, but to change to IV  Goal of Therapy:  Appropriate dose of Levaquin based  on renal function  Plan:   Levaquin 750mg  IV q48h   Follow Scr, adjust as necessary  No cx. Suggest pan cx.  Gwen Her PharmD  [phone] 03/13/2012 4:46 PM

## 2012-03-13 NOTE — Progress Notes (Signed)
Patient now wearing non- rebreather oxygen.  Pulse ox is 100%.  Patient now being telemetry monitored.  Showing NSR on the monitor.  One unit of packed red blood cells infused without difficulty.  No signs of adverse reaction.  Dr. Larina Bras aware of patient's abnormal labs.  Potassium elevated and patient medicated with kayexalate.  Dr. Barnetta Chapel also aware of elevated total bili and lactic acid levels.  Patient escorted to [facility] by nursing techs.  Report given to [facility] in ICU.  No further questions at this time.  Patient transferred to ICU for closer monitoring.

## 2012-03-13 NOTE — Progress Notes (Signed)
Agree with plan as noted. Will hold any potentially nephrotoxic drugs until his renal function improves. Will discontinue NSAID's as well as change levoquin to primaxin. Patient needs baseline uric acid which can increase greatly with sickle cell disease and renal failure. Agree with plans for serial CBC. He may require exchange transfusions tomorrow.  Cherene Dobbins L. August Saucer, M.D.

## 2012-03-13 NOTE — Procedures (Signed)
Procedure: Right basilic vein dual lumen PICC (40cm) placed.  Tip at cavoatrial junction and ready for use.   No complications.  Returning to floor.

## 2012-03-13 NOTE — Progress Notes (Signed)
MEDICATION RELATED CONSULT NOTE - INITIAL   Pharmacy Consult for Hydroxyurea Monitoring Indication: Sickle Cell Disease  Allergies  Allergen Reactions  . Codeine Hives    Patient Measurements: Height: 6' (182.9 cm) Weight: 143 lb 8.3 oz (65.1 kg) IBW/kg (Calculated) : 77.6    Vital Signs: Temp: 98.2 F (36.8 C) (06/13 0617) Temp src: Oral (06/13 0617) BP: 117/53 mmHg (06/13 0617) Pulse Rate: 98  (06/13 0617) Intake/Output from previous day: 06/12 0701 - 06/13 0700 In: 1391.7 [I.V.:1391.7] Out: -  Intake/Output from this shift:    Labs:  [facility] 03/13/12 0530 03/12/12 0310  WBC 20.3* 6.9  HGB 5.3* 8.0*  HCT 12.6* 20.0*  PLT 159 229  APTT -- --  CREATININE 1.86* 0.57  LABCREA -- --  CREATININE 1.86* 0.57  CREAT24HRUR -- --  MG -- --  PHOS -- --  ALBUMIN -- 4.1  PROT -- 7.7  ALBUMIN -- 4.1  AST -- 80*  ALT -- 32  ALKPHOS -- 73  BILITOT -- 13.7*  BILIDIR -- --  IBILI -- --   Estimated Creatinine Clearance: 56.9 ml/min (by C-G formula based on Cr of 1.86).   Microbiology: No results found for this or any previous visit (from the past 720 hour(s)).  Medical History: Past Medical History  Diagnosis Date  . Sickle cell disease, type SS   . Stroke   . Asthma     Medications:  Scheduled:    . docusate sodium  100 mg Oral BID  . folic acid  1 mg Oral Daily  . heparin  5,000 Units Subcutaneous Q8H  . senna  1 tablet Oral BID  . DISCONTD: hydroxyurea  1,000 mg Oral QODAY  . DISCONTD: hydroxyurea  1,500 mg Oral QODAY   Infusions:    . dextrose 5 % and 0.45% NaCl 125 mL/hr at 03/13/12 0256  . DISCONTD: sodium chloride 125 mL/hr at 03/12/12 1359   PRN: acetaminophen, acetaminophen, HYDROmorphone (DILAUDID) injection, ondansetron (ZOFRAN) IV, ondansetron, oxyCODONE, DISCONTD:  HYDROmorphone (DILAUDID) injection  Assessment:  26 y/o M on hydroxyurea 1000mg  alternating with 1500mg  daily for sickle cell disease.    Hgb today is 5.3.  By [facility]  Policy, hydroxyurea must be held whenever any of the following parameters are met:  ANC < 2000  Pltc < 80K in sickle-cell patients; < 100K in other patients  Hgb <= 6 in sickle-cell patients; < 8 in other patients  Reticulocytes < 80K when Hgb < 9  Plan:   Hold hydroxyurea for now per policy  Await further evaluation and orders per M.D.  Hope Budds, PharmD, BCPS Pager: [phone] 03/13/2012,7:57 AM

## 2012-03-13 NOTE — Progress Notes (Signed)
Patient very lethargic.  Too lethargic to sign consent for blood.  Dr. Durene Cal PA aware.  Patient given narcan 0.4mg  with success.  Patient awake, talkative, and able to sign consent for blood.  Dr. Durene Cal PA to adjust patient's pain medication.  Will continue to monitor.

## 2012-03-13 NOTE — Progress Notes (Signed)
ANTIBIOTIC CONSULT NOTE - INITIAL  Pharmacy Consult for primaxin Indication: pneumonia and rule out sepsis  Allergies  Allergen Reactions  . Codeine Hives    Patient Measurements: Height: 5\' 11"  (180.3 cm) Weight: 143 lb 15.4 oz (65.3 kg) IBW/kg (Calculated) : 75.3  Adjusted Body Weight:   Vital Signs: Temp: 98.5 F (36.9 C) (06/13 2200) Temp src: Oral (06/13 2039) BP: 127/83 mmHg (06/13 2039) Pulse Rate: 83  (06/13 2039) Intake/Output from previous day: 06/12 0701 - 06/13 0700 In: 1391.7 [I.V.:1391.7] Out: -  Intake/Output from this shift: Total I/O In: 282.5 [I.V.:270; Blood:12.5] Out: -   Labs:  Basename 03/13/12 1125 03/13/12 0805 03/13/12 0530 03/12/12 0310  WBC -- 22.0* 20.3* 6.9  HGB -- 5.9* 5.3* 8.0*  PLT -- 164 159 229  LABCREA -- -- -- --  CREATININE 2.55* -- 1.86* 0.57   Estimated Creatinine Clearance: 41.6 ml/min (by C-G formula based on Cr of 2.55). No results found for this basename: VANCOTROUGH:2,VANCOPEAK:2,VANCORANDOM:2,GENTTROUGH:2,GENTPEAK:2,GENTRANDOM:2,TOBRATROUGH:2,TOBRAPEAK:2,TOBRARND:2,AMIKACINPEAK:2,AMIKACINTROU:2,AMIKACIN:2, in the last 72 hours   Microbiology: Recent Results (from the past 720 hour(s))  MRSA PCR SCREENING     Status: Normal   Collection Time   03/13/12  4:19 PM      Component Value Range Status Comment   MRSA by PCR NEGATIVE  NEGATIVE Final     Medical History: Past Medical History  Diagnosis Date  . Sickle cell disease, type SS   . Stroke   . Asthma     Medications:  Anti-infectives     Start     Dose/Rate Route Frequency Ordered Stop   03/15/12 1400   levofloxacin (LEVAQUIN) IVPB 750 mg  Status:  Discontinued        750 mg 100 mL/hr over 90 Minutes Intravenous Every 48 hours 03/13/12 1643 03/13/12 2234   03/13/12 1400   levofloxacin (LEVAQUIN) tablet 750 mg  Status:  Discontinued        750 mg Oral Daily 03/13/12 1212 03/13/12 1240   03/13/12 1400   levofloxacin (LEVAQUIN) tablet 750 mg  Status:   Discontinued        750 mg Oral Every 48 hours 03/13/12 1240 03/13/12 1635   03/13/12 1230   azithromycin (ZITHROMAX) tablet 500 mg  Status:  Discontinued        500 mg Oral Daily 03/13/12 1219 03/13/12 1238         Assessment: Patient with levofloxacin already ordered.  MD now wants primaxin per pharmacy.    Goal of Therapy:  Primaxin dosed based on patient weight and renal function   Plan:  Follow up culture results primaxin 500mg  iv x1, then 250mg  iv q6hr  Darlina Guys, Jacquenette Shone Crowford 03/13/2012,10:39 PM

## 2012-03-14 ENCOUNTER — Inpatient Hospital Stay (HOSPITAL_COMMUNITY): Payer: Federal, State, Local not specified - PPO

## 2012-03-14 DIAGNOSIS — N179 Acute kidney failure, unspecified: Secondary | ICD-10-CM

## 2012-03-14 DIAGNOSIS — R0902 Hypoxemia: Secondary | ICD-10-CM

## 2012-03-14 DIAGNOSIS — E875 Hyperkalemia: Secondary | ICD-10-CM

## 2012-03-14 DIAGNOSIS — D5700 Hb-SS disease with crisis, unspecified: Secondary | ICD-10-CM

## 2012-03-14 LAB — COMPREHENSIVE METABOLIC PANEL
ALT: 154 U/L — ABNORMAL HIGH (ref 0–53)
AST: 276 U/L — ABNORMAL HIGH (ref 0–37)
Albumin: 3.3 g/dL — ABNORMAL LOW (ref 3.5–5.2)
Calcium: 8.5 mg/dL (ref 8.4–10.5)
Sodium: 132 mEq/L — ABNORMAL LOW (ref 135–145)
Total Protein: 6.6 g/dL (ref 6.0–8.3)

## 2012-03-14 LAB — URINE MICROSCOPIC-ADD ON

## 2012-03-14 LAB — BLOOD GAS, ARTERIAL
Acid-base deficit: 7.1 mmol/L — ABNORMAL HIGH (ref 0.0–2.0)
Bicarbonate: 19.8 mEq/L — ABNORMAL LOW (ref 20.0–24.0)
Bicarbonate: 17.3 mEq/L — ABNORMAL LOW (ref 20.0–24.0)
O2 Saturation: 74.4 %
O2 Saturation: 86.4 %
Patient temperature: 98.6
RATE: 16 resp/min
TCO2: 20 mmol/L (ref 0–100)
TCO2: 16.7 mmol/L (ref 0–100)
pCO2 arterial: 45.2 mmHg — ABNORMAL HIGH (ref 35.0–45.0)
pH, Arterial: 7.264 — ABNORMAL LOW (ref 7.350–7.450)
pO2, Arterial: 0 mmHg — CL (ref 80.0–100.0)
pO2, Arterial: 49.5 mmHg — ABNORMAL LOW (ref 80.0–100.0)

## 2012-03-14 LAB — BASIC METABOLIC PANEL
Calcium: 8.1 mg/dL — ABNORMAL LOW (ref 8.4–10.5)
Creatinine, Ser: 6.62 mg/dL — ABNORMAL HIGH (ref 0.50–1.35)
GFR calc non Af Amer: 11 mL/min — ABNORMAL LOW (ref >90)
Glucose, Bld: 121 mg/dL — ABNORMAL HIGH (ref 70–99)
Sodium: 130 mEq/L — ABNORMAL LOW (ref 135–145)

## 2012-03-14 LAB — CBC
HCT: 23 % — ABNORMAL LOW (ref 39.0–52.0)
Hemoglobin: 8.6 g/dL — ABNORMAL LOW (ref 13.0–17.0)
MCH: 31.5 pg (ref 26.0–34.0)
MCHC: 37.4 g/dL — ABNORMAL HIGH (ref 30.0–36.0)

## 2012-03-14 LAB — URINALYSIS, ROUTINE W REFLEX MICROSCOPIC
Glucose, UA: 100 mg/dL — AB
Protein, ur: >300 mg/dL — AB
Urobilinogen, UA: 1 mg/dL (ref 0.0–1.0)

## 2012-03-14 LAB — PREPARE RBC (CROSSMATCH)

## 2012-03-14 LAB — POTASSIUM: Potassium: 5.8 mEq/L — ABNORMAL HIGH (ref 3.5–5.1)

## 2012-03-14 MED ORDER — SODIUM CHLORIDE 0.9 % IJ SOLN: 9.0000 mL | INTRAMUSCULAR | Status: DC | PRN

## 2012-03-14 MED ORDER — HEPARIN (PORCINE) 2000 UNITS/L FOR CRRT
INTRAVENOUS_CENTRAL | Status: DC | PRN
  Filled 2012-03-14 (×3): qty 1000

## 2012-03-14 MED ORDER — DIPHENHYDRAMINE HCL 12.5 MG/5ML PO ELIX: 12.5000 mg | ORAL_SOLUTION | Freq: Four times a day (QID) | ORAL | Status: DC | PRN

## 2012-03-14 MED ORDER — LIDOCAINE HCL 2 % IJ SOLN
INTRAMUSCULAR | Status: AC
  Filled 2012-03-14: qty 1

## 2012-03-14 MED ORDER — DIPHENHYDRAMINE HCL 50 MG/ML IJ SOLN: 12.5000 mg | Freq: Four times a day (QID) | INTRAMUSCULAR | Status: DC | PRN

## 2012-03-14 MED ORDER — NALOXONE HCL 0.4 MG/ML IJ SOLN: 0.4000 mg | INTRAMUSCULAR | Status: DC | PRN

## 2012-03-14 MED ORDER — HEPARIN SODIUM (PORCINE) 1000 UNIT/ML DIALYSIS
1000.0000 [IU] | INTRAMUSCULAR | Status: DC | PRN
  Administered 2012-03-15: 5000 [IU] via INTRAVENOUS_CENTRAL
  Filled 2012-03-14: qty 5
  Filled 2012-03-14: qty 6

## 2012-03-14 MED ORDER — SODIUM BICARBONATE 8.4 % IV SOLN
INTRAVENOUS | Status: DC
  Administered 2012-03-14: 21:00:00 via INTRAVENOUS
  Filled 2012-03-14 (×5): qty 150

## 2012-03-14 MED ORDER — HYDROMORPHONE 0.3 MG/ML IV SOLN: INTRAVENOUS | Status: DC

## 2012-03-14 MED ORDER — PRISMASOL BGK 0/2.5 32-2.5 MEQ/L IV SOLN
INTRAVENOUS | Status: DC
  Administered 2012-03-15 (×2): via INTRAVENOUS_CENTRAL
  Filled 2012-03-14 (×7): qty 5000

## 2012-03-14 MED ORDER — SODIUM POLYSTYRENE SULFONATE 15 GM/60ML PO SUSP
15.0000 g | Freq: Once | ORAL | Status: AC
  Administered 2012-03-14: 15 g via ORAL
  Filled 2012-03-14: qty 60

## 2012-03-14 MED ORDER — ONDANSETRON HCL 4 MG/2ML IJ SOLN: 4.0000 mg | Freq: Four times a day (QID) | INTRAMUSCULAR | Status: DC | PRN

## 2012-03-14 MED ORDER — HYDROMORPHONE 0.3 MG/ML IV SOLN
INTRAVENOUS | Status: DC
  Administered 2012-03-14: 13:00:00 via INTRAVENOUS
  Administered 2012-03-14: 0.6 mg via INTRAVENOUS
  Administered 2012-03-14: 0.9 mg via INTRAVENOUS
  Filled 2012-03-14: qty 25

## 2012-03-14 MED ORDER — SODIUM POLYSTYRENE SULFONATE 15 GM/60ML PO SUSP
30.0000 g | Freq: Once | ORAL | Status: AC
  Administered 2012-03-14: 30 g via ORAL
  Filled 2012-03-14: qty 120

## 2012-03-14 MED ORDER — LIDOCAINE HCL 1 % IJ SOLN
INTRAMUSCULAR | Status: AC
  Filled 2012-03-14: qty 20

## 2012-03-14 MED ORDER — PRISMASOL BGK 0/2.5 32-2.5 MEQ/L IV SOLN
INTRAVENOUS | Status: DC
  Administered 2012-03-14: 23:00:00 via INTRAVENOUS_CENTRAL
  Filled 2012-03-14: qty 5000

## 2012-03-14 MED ORDER — SODIUM CHLORIDE 0.9 % IV SOLN
250.0000 mg | Freq: Two times a day (BID) | INTRAVENOUS | Status: DC
  Administered 2012-03-15: 250 mg via INTRAVENOUS
  Filled 2012-03-14 (×3): qty 250

## 2012-03-14 MED ORDER — VITAMINS A & D EX OINT
TOPICAL_OINTMENT | CUTANEOUS | Status: AC
  Administered 2012-03-14: 20:00:00 via TOPICAL
  Filled 2012-03-14: qty 5

## 2012-03-14 MED ORDER — SODIUM CHLORIDE 0.9 % IV SOLN
250.0000 mg | Freq: Three times a day (TID) | INTRAVENOUS | Status: DC
  Administered 2012-03-14: 250 mg via INTRAVENOUS
  Filled 2012-03-14 (×3): qty 250

## 2012-03-14 MED ORDER — ALLOPURINOL 100 MG PO TABS
100.0000 mg | ORAL_TABLET | Freq: Every day | ORAL | Status: DC
  Administered 2012-03-14: 100 mg via ORAL
  Filled 2012-03-14 (×2): qty 1

## 2012-03-14 MED ORDER — FUROSEMIDE 10 MG/ML IJ SOLN
40.0000 mg | Freq: Once | INTRAMUSCULAR | Status: AC
  Administered 2012-03-14: 40 mg via INTRAVENOUS
  Filled 2012-03-14: qty 4

## 2012-03-14 NOTE — Progress Notes (Signed)
Pharmacy Follow Up Note - Hydroxyurea   Hydroxyurea was placed on hold due to Hgb 5.3 on 6/13.  Today, Hgb has improved to 8.6 following yesterday's transfusions.  Hydroxyurea (Hydrea) hold criteria  ANC < 2  Pltc < 80K in sickle-cell patients; < 100K in other patients  Hgb <= 6 in sickle-cell patients; < 8 in other patients  Reticulocytes < 80K when Hgb < 9  Plan:  Consider restarting hydroxyurea therapy now that patient no longer meets hold criteria.   Clance Boll, PharmD, BCPS Pager: [phone] 03/14/2012 10:25 AM

## 2012-03-14 NOTE — Progress Notes (Signed)
Dr. August Saucer notified of patient condition, lab values, hemodynamics and of sepsis alarm from [facility]. No orders received.

## 2012-03-14 NOTE — Progress Notes (Signed)
Attempted to get pt to urinate. Pt attempted to sit on the side of the bed and also attempted to stand up and was unable to urinate. Pt states he does not remember the last time he urinated and does not feel the urge to go at this time because his stomach is hurting too bad. Bladder scan shows 255. Will notify on call MD.

## 2012-03-14 NOTE — Consult Note (Signed)
Referring Provider: No ref. provider found Primary Care Physician:  Dorrene German, MD Primary Nephrologist: None  Reason for Consultation:  Acute renal failure, oliguria and hyperkalemia  HPI: 26yoM with sickle cell disease, hemolysis, and h/o hypoxia presents with  sickle cell pain crises and hypoxia.He was treated initially there with IV fluids and pain medication. The initial creatinine was 0.5. Since admission he has been oliguric and has received aggressive volume resuscitation. He was given dilaudid and became more lethargic during the day. His laboratory evaluation demonstrated acute hemolysis and is deeply jaundiced. The urinalysis is remarkable for large blood but few red blood cells. There is no hydronephrosis on his ultrasound.      Past Medical History  Diagnosis Date  . Sickle cell disease, type SS   . Stroke   . Asthma     Past Surgical History  Procedure Date  . Cholecystectomy     Prior to Admission medications   Medication Sig Start Date End Date Taking? Authorizing Provider  folic acid (FOLVITE) 1 MG tablet Take 1 tablet (1 mg total) by mouth daily. 11/05/11  Yes Keitha Butte, NP  hydroxyurea (HYDREA) 500 MG capsule Take 2-3 capsules (1,000-1,500 mg total) by mouth daily. Alternates between 2 and 3 tablets each day. 11/05/11  Yes Keitha Butte, NP  HYDROcodone-acetaminophen (NORCO) 5-325 MG per tablet Take 1-2 tablets by mouth every 6 (six) hours as needed. For pain 03/12/12   Grayce Sessions, NP    Current Facility-Administered Medications  Medication Dose Route Frequency Provider Last Rate Last Dose  . acetaminophen (TYLENOL) tablet 650 mg  650 mg Oral Q6H PRN Devonne Doughty, MD       Or  . acetaminophen (TYLENOL) suppository 650 mg  650 mg Rectal Q6H PRN Devonne Doughty, MD      . allopurinol (ZYLOPRIM) tablet 100 mg  100 mg Oral Daily Gwenyth Bender, MD   100 mg at 03/14/12 0959  . folic acid (FOLVITE) tablet 1 mg  1 mg Oral Daily Devonne Doughty, MD   1 mg  at 03/14/12 0959  . furosemide (LASIX) injection 40 mg  40 mg Intravenous Once Gwenyth Bender, MD   40 mg at 03/14/12 1949  . hyoscyamine (LEVBID) 0.375 MG 12 hr tablet 0.375 mg  0.375 mg Oral Q12H Keitha Butte, NP   0.375 mg at 03/14/12 0959  . imipenem-cilastatin (PRIMAXIN) 250 mg in sodium chloride 0.9 % 100 mL IVPB  250 mg Intravenous Q12H Gwen Her, PHARMD      . imipenem-cilastatin (PRIMAXIN) 500 mg in sodium chloride 0.9 % 100 mL IVPB  500 mg Intravenous Once Gwenyth Bender, MD   500 mg at 03/13/12 2331  . lidocaine (LIDODERM) 5 % 2 patch  2 patch Transdermal Q24H Keitha Butte, NP      . lidocaine (XYLOCAINE) 1 % (with pres) injection           . lidocaine (XYLOCAINE) 1 % (with pres) injection           . lidocaine (XYLOCAINE) 2 % (with pres) injection           . ondansetron (ZOFRAN) injection 4 mg  4 mg Intravenous Q4H PRN Keitha Butte, NP       Or  . ondansetron (ZOFRAN) tablet 4 mg  4 mg Oral Q6H PRN Keitha Butte, NP      . pantoprazole (PROTONIX) EC tablet 40 mg  40 mg Oral BID  WC Keitha Butte, NP   40 mg at 03/14/12 1737  . sodium bicarbonate 150 mEq in dextrose 5 % 1,000 mL infusion   Intravenous Continuous Nelda Bucks, MD 75 mL/hr at 03/14/12 2044    . sodium polystyrene (KAYEXALATE) 15 GM/60ML suspension 15 g  15 g Oral Once Gwenyth Bender, MD   15 g at 03/14/12 0855  . sodium polystyrene (KAYEXALATE) 15 GM/60ML suspension 30 g  30 g Oral Once Gwenyth Bender, MD   30 g at 03/14/12 2016  . vitamin A & D ointment           . DISCONTD: dextrose 5 %-0.45 % sodium chloride infusion   Intravenous Continuous Gwenyth Bender, MD 100 mL/hr at 03/14/12 1908    . DISCONTD: diphenhydrAMINE (BENADRYL) 12.5 MG/5ML elixir 12.5 mg  12.5 mg Oral Q6H PRN Gwenyth Bender, MD      . DISCONTD: diphenhydrAMINE (BENADRYL) 12.5 MG/5ML elixir 12.5 mg  12.5 mg Oral Q6H PRN Gwenyth Bender, MD      . DISCONTD: diphenhydrAMINE (BENADRYL) injection 12.5 mg  12.5 mg Intravenous Q6H  PRN Keitha Butte, NP      . DISCONTD: diphenhydrAMINE (BENADRYL) injection 12.5 mg  12.5 mg Intravenous Q6H PRN Gwenyth Bender, MD      . DISCONTD: diphenhydrAMINE (BENADRYL) injection 12.5 mg  12.5 mg Intravenous Q6H PRN Gwenyth Bender, MD      . DISCONTD: HYDROmorphone (DILAUDID) injection 1 mg  1 mg Intravenous Q2H PRN Keitha Butte, NP   1 mg at 03/14/12 1029  . DISCONTD: HYDROmorphone (DILAUDID) PCA injection 0.3 mg/mL   Intravenous Q4H Gwenyth Bender, MD   0.9 mg at 03/14/12 1950  . DISCONTD: HYDROmorphone (DILAUDID) PCA injection 0.3 mg/mL   Intravenous Q4H Gwenyth Bender, MD      . DISCONTD: imipenem-cilastatin (PRIMAXIN) 250 mg in sodium chloride 0.9 % 100 mL IVPB  250 mg Intravenous Q6H Gwenyth Bender, MD   250 mg at 03/14/12 0604  . DISCONTD: imipenem-cilastatin (PRIMAXIN) 250 mg in sodium chloride 0.9 % 100 mL IVPB  250 mg Intravenous Q8H Maryanna Shape [place], PHARMD   250 mg at 03/14/12 1436  . DISCONTD: levofloxacin (LEVAQUIN) IVPB 750 mg  750 mg Intravenous Q48H Gwen Her, PHARMD      . DISCONTD: naloxone [facility]) injection 0.4 mg  0.4 mg Intravenous PRN Keitha Butte, NP   0.4 mg at 03/13/12 1010  . DISCONTD: naloxone (NARCAN) injection 0.4 mg  0.4 mg Intravenous PRN Gwenyth Bender, MD      . DISCONTD: naloxone [facility]) injection 0.4 mg  0.4 mg Intravenous PRN Gwenyth Bender, MD      . DISCONTD: ondansetron [facility]) injection 4 mg  4 mg Intravenous Q6H PRN Gwenyth Bender, MD      . DISCONTD: ondansetron [facility]) injection 4 mg  4 mg Intravenous Q6H PRN Gwenyth Bender, MD      . DISCONTD: oxyCODONE (Oxy IR/ROXICODONE) immediate release tablet 5 mg  5 mg Oral Q4H PRN Keitha Butte, NP   5 mg at 03/14/12 0958  . DISCONTD: sodium chloride 0.9 % injection 9 mL  9 mL Intravenous PRN Gwenyth Bender, MD      . DISCONTD: sodium chloride 0.9 % injection 9 mL  9 mL Intravenous PRN Gwenyth Bender, MD        Allergies as of 03/12/2012 - Review Complete 03/12/2012  Allergen Reaction Noted  .  Codeine Hives 09/27/2011    Family History  Problem Relation Age of Onset  . Diabetes Sister     History   Social History  . Marital Status: Single    Spouse Name: N/A    Number of Children: N/A  . Years of Education: N/A   Occupational History  . Not on file.   Social History Main Topics  . Smoking status: Never Smoker   . Smokeless tobacco: Not on file  . Alcohol Use: 6.0 oz/week    10 Shots of liquor per week     socially  . Drug Use: Yes    Special: Marijuana  . Sexually Active: Yes   Other Topics Concern  . Not on file   Social History Narrative  . No narrative on file    Review of Systems: Gen: Denies any fever, chills HEENT: No visual complaints, No history of Retinopathy. Icteric No Epistaxis or Sore throat. No sinusitis.   OZ:HYQMVHQ pain Resp: Denies dyspnea at rest, dyspnea with exercise, cough, sputum, wheezing, coughing up blood, and pleurisy. GI: Denies vomiting blood, jaundice, and fecal incontinence.   Denies dysphagia or odynophagia. GU : anuric MS: Diffuse pain. Derm: Denies rash, itching, dry skin, hives, moles, warts, or unhealing ulcers.  Psych: Denies depression, anxiety, memory loss, suicidal ideation, hallucinations, paranoia, and confusion. Heme: Denies bruising, bleeding, and enlarged lymph nodes. Neuro: Lethargy Endocrine No DM.  No Thyroid disease.  No Adrenal disease.  Physical Exam: Vital signs in last 24 hours: Temp:  [98.1 F (36.7 C)-100.3 F (37.9 C)] 99.1 F (37.3 C) (06/14 2000) Pulse Rate:  [51-101] 99  (06/14 1600) Resp:  [17-34] 34  (06/14 1800) BP: (106-130)/(39-84) 128/61 mmHg (06/14 1800) SpO2:  [93 %-99 %] 98 % (06/14 1800) FiO2 (%):  [4 %-100 %] 100 % (06/14 1950) Last BM Date: 03/13/12 General:   Alert,  Well-developed, well-nourished, pleasant and cooperative in NAD Head:  Normocephalic and atraumatic. Eyes:  Sclera clear,+  icterus.   Conjunctiva pink. Ears:  Normal auditory acuity. Nose:  No deformity,  discharge,  or lesions. Mouth:  No deformity or lesions, dentition normal. Neck:  Supple; no masses or thyromegaly. JVP not elevated Lungs:  Clear throughout to auscultation.   No wheezes, crackles, or rhonchi. No acute distress. Heart:  Regular rate and rhythm; no murmurs, clicks, rubs,  or gallops. Abdomen:  Soft, nontender and nondistended. No masses, hepatosplenomegaly or hernias noted. Hypoactive  Msk:  Symmetrical without gross deformities. Normal posture. Pulses:  No carotid, renal, femoral bruits. DP and PT symmetrical and equal Extremities:  Without clubbing or edema. Neurologic:  Alert and  oriented x4;  grossly normal neurologically. Skin:  Intact without significant lesions or rashes. Cervical Nodes:  No significant cervical adenopathy. Psych:  Alert and cooperative. Normal mood and affect.  Intake/Output from previous day: 06/13 0701 - 06/14 0700 In: 3123.5 [I.V.:1955; Blood:968.5; IV Piggyback:200] Out: 200 [Urine:200] Intake/Output this shift: Total I/O In: 141.7 [I.V.:141.7] Out: -   Lab Results:  Basename 03/14/12 0600 03/13/12 0805 03/13/12 0530  WBC 30.2* 22.0* 20.3*  HGB 8.6* 5.9* 5.3*  HCT 23.0* 14.1* 12.6*  PLT 165 164 159   BMET  Basename 03/14/12 1958 03/14/12 1150 03/14/12 0600 03/13/12 1125  NA 130* -- 132* 135  K 5.9* 5.8* 6.5* --  CL 98 -- 100 102  CO2 19 -- 20 19  GLUCOSE 121* -- 114* 130*  BUN 64* -- 41* 18  CREATININE 6.62* -- 5.20* 2.55*  CALCIUM 8.1* --  8.5 8.5  PHOS -- -- -- 4.3   LFT  Basename 03/14/12 0600  PROT 6.6  ALBUMIN 3.3*  AST 276*  ALT 154*  ALKPHOS 61  BILITOT 19.5*  BILIDIR --  IBILI --   PT/INR No results found for this basename: LABPROT:2,INR:2 in the last 72 hours Hepatitis Panel No results found for this basename: HEPBSAG,HCVAB,HEPAIGM,HEPBIGM in the last 72 hours  Studies/Results: US Abdomen Complete  03/13/2012  *RADIOLOGY REPORT*  Clinical Data:  Abdominal pain, sickle cell disease  ABDOMINAL  ULTRASOUND COMPLETE  Comparison:  11/24/2007  Findings:  Gallbladder:  Surgically removed  Common Bile Duct:  Within normal limits in caliber.  Liver: Mild diffuse intrahepatic biliary prominence, nonspecific. This could be related to prior cholecystectomy.  Recommend correlation with bilirubin and liver function test.  Otherwise no focal hepatic abnormality or mass.  IVC:  Appears normal.  Pancreas:  No abnormality identified.  Spleen:  Limited visualization.  Spleen only measures 3.1 cm.  No definite focal abnormality.  Right kidney:  12 cm length.  Increased cortical echogenicity without hydronephrosis.  Prominent medullary pyramids.  Small amount of perinephric fluid in the right upper quadrant along the liver and splenic margin.  This is compatible with nonobstructive medical renal disease.  Left kidney:  11.8 cm length.  Increased cortical echogenicity without hydronephrosis.  Prominent medullary pyramids.  No focal abnormality.  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  IMPRESSION: Previous cholecystectomy.  Nonspecific slight biliary prominence throughout the liver without definite obstruction.  Common bile duct measures 4 mm.  Recommend correlation with serum bilirubin and LFTs.  Increased renal echogenicity bilaterally with a small amount of free fluid about the right kidney and liver margin.  Suspect related to medical renal disease.  No hydronephrosis or obstruction  Original Report Authenticated By: Judie Petit. Ruel Favors, M.D.   Ir Fluoro Guide Cv Line Right  03/13/2012  *RADIOLOGY REPORT*  PICC PLACEMENT WITH ULTRASOUND AND FLUOROSCOPIC  GUIDANCE  Clinical History: 26 year old male with sickle cell disease and pain crisis, also concern for possible sepsis.  Requires durable central venous access.  Fluoroscopy Time: 0.6 minutes.  PROCEDURE/FINDINGS:  The right arm was prepped with chlorhexidine, draped in the usual sterile fashion using  maximum barrier technique (cap and mask, sterile gown, sterile gloves, large sterile sheet, hand hygiene and cutaneous antiseptic).  Local anesthesia was attained by infiltration with 1% lidocaine.  Ultrasound demonstrated patency of the basilic vein, and this was documented with an image.  Under real-time ultrasound guidance, this vein was accessed with a 21 gauge micropuncture needle and image documentation was performed.  The needle was exchanged over a guidewire for a peel-away sheath through which a 40 cm  5 [place] dual lumen power injectable PICC was advanced, and positioned with its tip at the lower SVC/right atrial junction.  Fluoroscopy during the procedure and fluoro spot radiograph confirms appropriate catheter position.  The catheter was flushed, secured to the skin with Prolene sutures, and covered with a sterile dressing.  Complications:  None.  The patient tolerated the procedure well.  IMPRESSION: Successful placement of a right arm dual lumen power injectable PICC with sonographic and fluoroscopic guidance.  The catheter is ready for use.  Signed,  Sterling Big, MD Vascular & Interventional Radiologist [facility] Radiology  Original Report Authenticated By: Alvino Blood Chest Port 1 View  03/14/2012  *RADIOLOGY REPORT*  Clinical Data: Shortness of breath.  Confusion.  Evaluate for acute  chest syndrome.  PORTABLE CHEST - 1 VIEW  Comparison: Chest x-ray 03/13/2012.  Findings: There is a right upper extremity PICC with tip terminating in the superior aspect of the right atrium. Lung volumes are low.  There is pulmonary venous congestion without frank pulmonary edema.  Mild cardiomegaly is unchanged. Retrocardiac opacity on the left is favored to represent subsegmental atelectasis, however, an area of airspace consolidation is difficult to exclude.  Mediastinal contours are unremarkable.  IMPRESSION: 1.  Interval development of pulmonary venous congestion.  No frank pulmonary edema. 2.  Mild  cardiomegaly is unchanged. 3.  Interval development of a left retrocardiac opacity favored to represent atelectasis.  This could be better evaluated with a standing PA and lateral chest radiographs if clinically indicated.  Original Report Authenticated By: Florencia Reasons, M.D.   Dg Chest Port 1 View  03/13/2012  *RADIOLOGY REPORT*  Clinical Data: Hypoxia.  Sickle cell crisis.  PORTABLE CHEST - 1 VIEW  Comparison: 03/12/2012  Findings: Heart is enlarged.  Right-sided PICC line tip overlies the level of the superior vena cava.  There are no focal consolidations or pleural effusions.  Slight interstitial prominence appears stable.  IMPRESSION: Stable cardiomegaly.  Original Report Authenticated By: Patterson Hammersmith, M.D.    Assessment/Plan: Acute renal failure in the setting of sickle cell crisis, hypoxemia and hypovolemia and hemoglobinuria.   Hyperkalemia  Metabolic Acidosis  Will place patient on CVVHDF  LOS: 2 Candence Sease W @TODAY @10 :30 PM

## 2012-03-14 NOTE — Progress Notes (Signed)
  Echocardiogram 2D Echocardiogram has been performed.  Cebastian Neis L 03/14/2012, 9:31 AM

## 2012-03-14 NOTE — Progress Notes (Signed)
Dr. August Saucer at [facility] Notified of pt neuro condition, hemodynamics, medications administered, blood transfusion. Orders received.

## 2012-03-14 NOTE — Progress Notes (Signed)
Attempted to get pt to urinate and pt states he felt the urge to go. Pt was unable to urinate so pt was bladder scanned and only showed 81cc.

## 2012-03-14 NOTE — Procedures (Signed)
Central Venous Catheter Insertion Procedure Note Philip Rios [id number] Feb 07, 1986  Procedure: Insertion of Central Venous Catheter Indications: Drug and/or fluid administration, Frequent blood sampling and Hemodialysis  Procedure Details Consent: Risks of procedure as well as the alternatives and risks of each were explained to the (patient/caregiver).  Consent for procedure obtained. Time Out: Verified patient identification, verified procedure, site/side was marked, verified correct patient position, special equipment/implants available, medications/allergies/relevent history reviewed, required imaging and test results available.  Performed  Maximum sterile technique was used including antiseptics, cap, gloves, gown, hand hygiene, mask and sheet. Skin prep: Chlorhexidine; local anesthetic administered A antimicrobial bonded/coated triple lumen catheter was placed in the right femoral vein due to patient being a dialysis patient and emergent situation using the Seldinger technique.  Evaluation Blood flow good Complications: No apparent complications Patient did tolerate procedure well. Chest X-ray ordered to verify placement.  CXR: not needed.Marland Kitchen  Philip Rios 03/14/2012, 10:13 PM

## 2012-03-14 NOTE — Progress Notes (Signed)
eLink Physician-Brief Progress Note Patient Name: Philip Rios DOB: 19-May-1986 MRN: [id number]  Date of Service  03/14/2012   HPI/Events of Note   Dc dialdued  pca in setting arf, likley related to neurostatus Assess cvp Dc further lasix with SS crisis  eICU Interventions  Above Calling renal, awail K Ph 7.24 Neurostatus fro dilauded in ARF likely, lowthrehsold CT head   Intervention Category Major Interventions: Acid-Base disturbance - evaluation and management;Change in mental status - evaluation and management;Hypovolemia - evaluation and treatment with fluids  Nelda Bucks. 03/14/2012, 8:29 PM

## 2012-03-14 NOTE — Progress Notes (Signed)
Dr. August Saucer notified of new lab results, vital signs, condition and progress since last update. Orders received.

## 2012-03-14 NOTE — Consult Note (Signed)
Name: Philip Rios MRN: [id number] DOB: 1986-03-31    LOS: 2  Referring Provider:  Willey Blade, MD Reason for Referral:  Acute renal failure, altered mental status  PULMONARY / CRITICAL CARE MEDICINE  HPI:  Patient is a 26 y/o AAM with PMHx of SS disease who was admitted due to chest pain and body aches and was founfd to have hb 8, TB 13, O2 sat 88% on RA and he was admitted by IM to the floors. On the floors, patient was requiring more opioids for pain control and was transferred to the ICU due to drowsiness and for better pain control. Pt was transfused with 1 unit of RBC today and creatinine has been worsening 0.57 --> 1.87 --> 2.55 --> 5.20. K also increased to 6.2 and received Kayexalate. Patient also received lasix today, but has not made urine in the last 24 hours. Critical care consult was requested due to altered mental status, worsening of creatinine, hyperkalemia and possible acute chest syndrome in a setting of SSD. Pt denies any cough, sputum production or fevers.  Past Medical History  Diagnosis Date  . Sickle cell disease, type SS   . Stroke   . Asthma    Past Surgical History  Procedure Date  . Cholecystectomy    Prior to Admission medications   Medication Sig Start Date End Date Taking? Authorizing Provider  folic acid (FOLVITE) 1 MG tablet Take 1 tablet (1 mg total) by mouth daily. 11/05/11  Yes Keitha Butte, NP  hydroxyurea (HYDREA) 500 MG capsule Take 2-3 capsules (1,000-1,500 mg total) by mouth daily. Alternates between 2 and 3 tablets each day. 11/05/11  Yes Keitha Butte, NP  HYDROcodone-acetaminophen (NORCO) 5-325 MG per tablet Take 1-2 tablets by mouth every 6 (six) hours as needed. For pain 03/12/12   Grayce Sessions, NP   Allergies Allergies  Allergen Reactions  . Codeine Hives    Family History Family History  Problem Relation Age of Onset  . Diabetes Sister    Social History  reports that he has never smoked. He does not have any  smokeless tobacco history on file. He reports that he drinks about 6 ounces of alcohol per week. He reports that he uses illicit drugs (Marijuana).  Review Of Systems:  All other systems were negative except as above in HPI  Current Status:  Vital Signs: Temp:  [98.1 F (36.7 C)-100.3 F (37.9 C)] 99.1 F (37.3 C) (06/14 2000) Pulse Rate:  [51-101] 99  (06/14 1600) Resp:  [17-34] 34  (06/14 1800) BP: (106-137)/(39-84) 128/61 mmHg (06/14 1800) SpO2:  [93 %-99 %] 98 % (06/14 1800) FiO2 (%):  [4 %-100 %] 100 % (06/14 1950)  Physical Examination: Neuro:  Mildly drowsy but easily arousable, oriented x3, no resp distress. HEENT:  + JVD, no lymphadenopathy Cardiovascular:  Tachycardic rhythm, S1 and S2. Lungs:  Mildly decreased breath sounds at the pulm bases with mild fine crackles at the abses, no rhonchi or wheezes. Abdomen:  Soft, non tender, non distended, BS active, no peritoneal signs Musculoskeletal:  Generalized muscleskeletal pain Skin: No rash  Principal Problem:  *Sickle cell crisis Active Problems:  Hypoxia  Sickle cell disease  Hyperkalemia  Abdominal pain  Elevated brain natriuretic peptide (BNP) level  Acute renal failure  Hemochromatosis   ASSESSMENT AND PLAN  PULMONARY  Lab 03/14/12 2005 03/14/12 1550 03/13/12 1021  PHART 7.343* 7.341* 7.264*  PCO2ART 32.8* 34.7* 45.2*  PO2ART 69.5* 49.5* 0.0*  HCO3 17.3* 18.2* 19.8*  O2SAT 86.4 74.4 90.9   Ventilator Settings: Vent Mode:  [-]  FiO2 (%):  [4 %-100 %] 100 % CXR:  Bibasilar atelectasis, mild pulm vasc congestion  A:  Hypoxia  Hx of asthma P:  Possible acute chest syndrome - Hx of SSD, chest pain, hemolysis - Rule out pneumonia - Pt was started on Imipenem  - Blood cultures pending, urine culture - Cont Oxygen [place]  - ABG 7.34/32/69/17  On 4L [place] - Low threshold to intubate If ABG worsens, pt requires more FIO2 or neuro status worsen.  - No active wheezing, Albuterol PRN  CARDIOVASCULAR  Lab  03/14/12 1700 03/13/12 1125  TROPONINI -- --  LATICACIDVEN -- 6.5*  PROBNP 34701.0* 9075.0*    Lines: R femoral HD  A: tachycardia P:  Likely due to anemia - R/o infection - BP stable. - HD catheter placed for HD. - BNP 34701, with some pulm vasc congestion - No indication for intubation and mech ventilation at this time.  RENAL  Lab 03/14/12 1958 03/14/12 1150 03/14/12 0600 03/13/12 1125 03/13/12 0530 03/12/12 0310  NA 130* -- 132* 135 134* 135  K 5.9* 5.8* -- -- -- --  CL 98 -- 100 102 100 102  CO2 19 -- 20 19 18* 19  BUN 64* -- 41* 18 11 5*  CREATININE 6.62* -- 5.20* 2.55* 1.86* 0.57  CALCIUM 8.1* -- 8.5 8.5 8.3* 8.9  MG -- -- -- 1.7 -- --  PHOS -- -- -- 4.3 -- --   Intake/Output      06/14 0701 - 06/15 0700   P.O. 360   I.V. (mL/kg) 1025.7 (15.7)   Blood 362.5   IV Piggyback 100   Total Intake(mL/kg) 1848.2 (28.3)   Urine (mL/kg/hr) 4 (0)   Other 400   Total Output 404   Net +1444.2        Foley:  pending  A:  Acute Renal failure P:  Dehydration, rule out ATN - R/o pigment nephropathy due to hemolysis - K elevated, received Kayexalate - HD catheter placed, Nephrology on consult to start HD vs CVVHD - Bicarb drip. - Cont IVF hydration  GASTROINTESTINAL  Lab 03/14/12 0600 03/13/12 1125 03/12/12 0310  AST 276* 172* 80*  ALT 154* 41 32  ALKPHOS 61 54 73  BILITOT 19.5* 18.8* 13.7*  PROT 6.6 6.7 7.7  ALBUMIN 3.3* 3.4* 4.1    A:  Elevated LFTs P:  Likely due to Hemolysis - TB 19.5 - Follow LFTs  HEMATOLOGIC  Lab 03/14/12 0600 03/13/12 0805 03/13/12 0530 03/12/12 0310  HGB 8.6* 5.9* 5.3* 8.0*  HCT 23.0* 14.1* 12.6* 20.0*  PLT 165 164 159 229  INR -- -- -- --  APTT -- -- -- --   A:  SCD P: Hb 8.6 after Blood transfusion - Active hemolysis - Possible blood exchange transfusion. - Likely Hematology consult.  INFECTIOUS  Lab 03/14/12 0600 03/13/12 0805 03/13/12 0530 03/12/12 0310  WBC 30.2* 22.0* 20.3* 6.9  PROCALCITON -- -- -- --    Cultures: Blood pending Urine cult  Antibiotics: Imipenem  A: R/o acute chest syndrome   P:   CXR with pulm vasc congestion and possible bibasilar infiltrates - Will cont to cover with Imipenem   NEUROLOGIC  A: Altered mental status P:   Likely due to pain management - Will hold dilaudid PCA for now - Mental status improved during central line placement  BEST PRACTICE / DISPOSITION Level of Care:  ICU Primary Service:  PCCM Consultants:  Nephrology Code Status:  Full code Diet:  NPO for now DVT Px:  SCDs  Faraz Ponciano, M.D. Pulmonary and Critical Care Medicine [facility] Pager: [phone]  03/14/2012, 10:20 PM

## 2012-03-14 NOTE — Progress Notes (Signed)
eLink Physician-Brief Progress Note Patient Name: Philip Rios DOB: 1985/10/28 MRN: [id number]  Date of Service  03/14/2012   HPI/Events of Note   Called by RN , unresponsive, looks acidotic kussmal, renal failure, prior hyperK etc, no urine x 24 hrs  eICU Interventions  STAT ABG, BMET, PCXR, will likley need HD, ETT?, contacted Dr August Saucer, CCm to see now, will call renal as well. [facility] Tx likely needed   Intervention Category Major Interventions: Acid-Base disturbance - evaluation and management  [place] Neuroth J. 03/14/2012, 8:17 PM

## 2012-03-14 NOTE — Progress Notes (Signed)
Subjective:  Patient with increasing weakness. His oral intake is decreased. Patient reports his pain is been a 7/10 today. He has had fluctuating periods of alertness. Nurse reports his urine output has decreased considerably.patient has incentive spirometer but has not been able to use this.   Allergies  Allergen Reactions  . Codeine Hives   Current Facility-Administered Medications  Medication Dose Route Frequency Provider Last Rate Last Dose  . acetaminophen (TYLENOL) tablet 650 mg  650 mg Oral Q6H PRN Devonne Doughty, MD       Or  . acetaminophen (TYLENOL) suppository 650 mg  650 mg Rectal Q6H PRN Devonne Doughty, MD      . allopurinol (ZYLOPRIM) tablet 100 mg  100 mg Oral Daily Gwenyth Bender, MD   100 mg at 03/14/12 0959  . dextrose 5 %-0.45 % sodium chloride infusion   Intravenous Continuous Gwenyth Bender, MD 20 mL/hr at 03/13/12 1630    . diphenhydrAMINE (BENADRYL) injection 12.5 mg  12.5 mg Intravenous Q6H PRN Gwenyth Bender, MD       Or  . diphenhydrAMINE (BENADRYL) 12.5 MG/5ML elixir 12.5 mg  12.5 mg Oral Q6H PRN Gwenyth Bender, MD      . diphenhydrAMINE (BENADRYL) injection 12.5 mg  12.5 mg Intravenous Q6H PRN Gwenyth Bender, MD       Or  . diphenhydrAMINE (BENADRYL) 12.5 MG/5ML elixir 12.5 mg  12.5 mg Oral Q6H PRN Gwenyth Bender, MD      . diphenhydrAMINE (BENADRYL) injection 12.5 mg  12.5 mg Intravenous Q6H PRN Keitha Butte, NP      . folic acid (FOLVITE) tablet 1 mg  1 mg Oral Daily Devonne Doughty, MD   1 mg at 03/14/12 0959  . HYDROmorphone (DILAUDID) injection 1 mg  1 mg Intravenous Q2H PRN Keitha Butte, NP   1 mg at 03/14/12 1029  . HYDROmorphone (DILAUDID) PCA injection 0.3 mg/mL   Intravenous Q4H Gwenyth Bender, MD      . hyoscyamine (LEVBID) 0.375 MG 12 hr tablet 0.375 mg  0.375 mg Oral Q12H Keitha Butte, NP   0.375 mg at 03/14/12 0959  . imipenem-cilastatin (PRIMAXIN) 250 mg in sodium chloride 0.9 % 100 mL IVPB  250 mg Intravenous Q8H Maryanna Shape Runyon, PHARMD   250 mg at  03/14/12 1436  . imipenem-cilastatin (PRIMAXIN) 500 mg in sodium chloride 0.9 % 100 mL IVPB  500 mg Intravenous Once Gwenyth Bender, MD   500 mg at 03/13/12 2331  . lidocaine (LIDODERM) 5 % 2 patch  2 patch Transdermal Q24H Keitha Butte, NP      . lidocaine (XYLOCAINE) 1 % (with pres) injection           . naloxone (NARCAN) 0.4 MG/ML injection           . naloxone [facility]) injection 0.4 mg  0.4 mg Intravenous PRN Gwenyth Bender, MD       And  . sodium chloride 0.9 % injection 9 mL  9 mL Intravenous PRN Gwenyth Bender, MD      . naloxone [facility]) injection 0.4 mg  0.4 mg Intravenous PRN Gwenyth Bender, MD       And  . sodium chloride 0.9 % injection 9 mL  9 mL Intravenous PRN Gwenyth Bender, MD      . ondansetron [facility]) injection 4 mg  4 mg Intravenous Q4H PRN Keitha Butte, NP  Or  . ondansetron (ZOFRAN) tablet 4 mg  4 mg Oral Q6H PRN Keitha Butte, NP      . ondansetron (ZOFRAN) injection 4 mg  4 mg Intravenous Q6H PRN Gwenyth Bender, MD      . ondansetron [facility]) injection 4 mg  4 mg Intravenous Q6H PRN Gwenyth Bender, MD      . oxyCODONE (Oxy IR/ROXICODONE) immediate release tablet 5 mg  5 mg Oral Q4H PRN Keitha Butte, NP   5 mg at 03/14/12 0958  . pantoprazole (PROTONIX) EC tablet 40 mg  40 mg Oral BID WC Keitha Butte, NP   40 mg at 03/14/12 0855  . sodium polystyrene (KAYEXALATE) 15 GM/60ML suspension 15 g  15 g Oral Once Gwenyth Bender, MD   15 g at 03/14/12 0855  . DISCONTD: diclofenac sodium (VOLTAREN) 1 % transdermal gel   Topical QID Keitha Butte, NP      . DISCONTD: HYDROmorphone (DILAUDID) PCA injection 0.3 mg/mL   Intravenous Q4H Gwenyth Bender, MD      . DISCONTD: imipenem-cilastatin (PRIMAXIN) 250 mg in sodium chloride 0.9 % 100 mL IVPB  250 mg Intravenous Q6H Gwenyth Bender, MD   250 mg at 03/14/12 0604  . DISCONTD: ketorolac (TORADOL) 30 MG/ML injection 15 mg  15 mg Intravenous Q8H Keitha Butte, NP   15 mg at 03/13/12 1301  . DISCONTD: levofloxacin  (LEVAQUIN) IVPB 750 mg  750 mg Intravenous Q48H Gwen Her, PHARMD      . DISCONTD: levofloxacin (LEVAQUIN) tablet 750 mg  750 mg Oral Q48H Keitha Butte, NP   750 mg at 03/13/12 1357  . DISCONTD: naloxone (NARCAN) injection 0.4 mg  0.4 mg Intravenous PRN Keitha Butte, NP   0.4 mg at 03/13/12 1010    Objective: Blood pressure 108/39, pulse 96, temperature 100.1 F (37.8 C), temperature source Oral, resp. rate 27, height 5\' 11"  (1.803 m), weight 143 lb 15.4 oz (65.3 kg), SpO2 96.00%.  Weak-appearing black male still arousable to answer questions. HEENT:positive sclericterus.  NECK:no posterior cervical nodes. LUNGS:distant breath sounds. No wheezes. No CVA tenderness. ZO:XWRUEA S1, S2 without S3. No murmurs or rubs. VWU:JWJXBJYNW. GNF:AOZHY edema NEURO:less alert, but arousable.Moves all extremities.  Lab results: Results for orders placed during the hospital encounter of 03/12/12 (from the past 48 hour(s))  BASIC METABOLIC PANEL     Status: Abnormal   Collection Time   03/13/12  5:30 AM      Component Value Range Comment   Sodium 134 (*) 135 - 145 mEq/L    Potassium 5.0  3.5 - 5.1 mEq/L    Chloride 100  96 - 112 mEq/L    CO2 18 (*) 19 - 32 mEq/L    Glucose, Bld 188 (*) 70 - 99 mg/dL    BUN 11  6 - 23 mg/dL    Creatinine, Ser 8.65 (*) 0.50 - 1.35 mg/dL ICTERUS AT THIS LEVEL MAY AFFECT RESULT   Calcium 8.3 (*) 8.4 - 10.5 mg/dL    GFR calc non Af Amer 50 (*) >90 mL/min    GFR calc Af Amer 57 (*) >90 mL/min   CBC     Status: Abnormal   Collection Time   03/13/12  5:30 AM      Component Value Range Comment   WBC 20.3 (*) 4.0 - 10.5 K/uL    RBC 1.56 (*) 4.22 - 5.81 MIL/uL    Hemoglobin 5.3 (*) 13.0 - 17.0  g/dL    HCT 86.5 (*) 78.4 - 52.0 %    MCV 80.8  78.0 - 100.0 fL    MCH 34.0  26.0 - 34.0 pg    MCHC >38.5 (*) 30.0 - 36.0 g/dL    RDW 69.6 (*) 29.5 - 15.5 %    Platelets 159  150 - 400 K/uL   CBC     Status: Abnormal   Collection Time   03/13/12  8:05 AM        Component Value Range Comment   WBC 22.0 (*) 4.0 - 10.5 K/uL    RBC 1.69 (*) 4.22 - 5.81 MIL/uL    Hemoglobin 5.9 (*) 13.0 - 17.0 g/dL    HCT 28.4 (*) 13.2 - 52.0 %    MCV 83.4  78.0 - 100.0 fL    MCH 34.9 (*) 26.0 - 34.0 pg    MCHC >38.5 (*) 30.0 - 36.0 g/dL    RDW 44.0 (*) 10.2 - 15.5 %    Platelets 164  150 - 400 K/uL   TYPE AND SCREEN     Status: Normal (Preliminary result)   Collection Time   03/13/12  9:30 AM      Component Value Range Comment   ABO/RH(D) O NEG      Antibody Screen NEG      Sample Expiration 03/16/2012      Unit Number [id number]      Blood Component Type RED CELLS,LR      Unit division 00      Status of Unit ISSUED,FINAL      Donor AG Type        Value: NEGATIVE FOR E ANTIGEN NEGATIVE FOR C ANTIGEN NEGATIVE FOR KELL ANTIGEN   Transfusion Status OK TO TRANSFUSE      Crossmatch Result Compatible      Unit Number [id number]      Blood Component Type RED CELLS,LR      Unit division 00      Status of Unit ISSUED,FINAL      Donor AG Type        Value: NEGATIVE FOR C ANTIGEN NEGATIVE FOR E ANTIGEN NEGATIVE FOR KELL ANTIGEN   Transfusion Status OK TO TRANSFUSE      Crossmatch Result Compatible      Unit Number [id number]      Blood Component Type RBC LR PHER1      Unit division 00      Status of Unit ISSUED,FINAL      Donor AG Type        Value: NEGATIVE FOR C ANTIGEN NEGATIVE FOR E ANTIGEN NEGATIVE FOR KELL ANTIGEN   Transfusion Status OK TO TRANSFUSE      Crossmatch Result COMPATIBLE      Unit Number [id number]      Blood Component Type RBC LR PHER2      Unit division 00      Status of Unit ISSUED      Donor AG Type        Value: NEGATIVE FOR C ANTIGEN NEGATIVE FOR E ANTIGEN NEGATIVE FOR KELL ANTIGEN   Transfusion Status OK TO TRANSFUSE      Crossmatch Result COMPATIBLE      Unit Number [id number]      Blood Component Type RED CELLS,LR      Unit division 00      Status of Unit ALLOCATED      Donor AG Type        Value: NEGATIVE FOR  C ANTIGEN  NEGATIVE FOR E ANTIGEN NEGATIVE FOR KELL ANTIGEN   Transfusion Status OK TO TRANSFUSE      Crossmatch Result COMPATIBLE     BLOOD GAS, ARTERIAL     Status: Abnormal   Collection Time   03/13/12 10:21 AM      Component Value Range Comment   O2 Content 4.0      Delivery systems NASAL CANNULA      pH, Arterial 7.264 (*) 7.350 - 7.450    pCO2 arterial 45.2 (*) 35.0 - 45.0 mmHg    pO2, Arterial 0.0 (*) 80.0 - 100.0 mmHg NO VALUE GIVEN FORPARAMETER NUMBER OUT OF ACCEPTABLE RANGE  M.D. NOTIFIED BYTAMMY BURGESS RRT,RCP 03/13/2012 @ 1034   Bicarbonate 19.8 (*) 20.0 - 24.0 mEq/L    TCO2 20.0  0 - 100 mmol/L    Acid-base deficit 6.1 (*) 0.0 - 2.0 mmol/L    O2 Saturation 90.9      Patient temperature 98.6      Collection site RIGHT RADIAL      Drawn by [id number]      Sample type ARTERIAL      Allens test (pass/fail) PASS  PASS   AMMONIA     Status: Normal   Collection Time   03/13/12 11:25 AM      Component Value Range Comment   Ammonia RESULTS UNAVAILABLE DUE TO INTERFERING SUBSTANCE  11 - 60 umol/L   COMPREHENSIVE METABOLIC PANEL     Status: Abnormal   Collection Time   03/13/12 11:25 AM      Component Value Range Comment   Sodium 135  135 - 145 mEq/L    Potassium 6.1 (*) 3.5 - 5.1 mEq/L    Chloride 102  96 - 112 mEq/L    CO2 19  19 - 32 mEq/L    Glucose, Bld 130 (*) 70 - 99 mg/dL    BUN 18  6 - 23 mg/dL    Creatinine, Ser 9.56 (*) 0.50 - 1.35 mg/dL ICTERUS AT THIS LEVEL MAY AFFECT RESULT   Calcium 8.5  8.4 - 10.5 mg/dL    Total Protein 6.7  6.0 - 8.3 g/dL ICTERUS AT THIS LEVEL MAY AFFECT RESULT   Albumin 3.4 (*) 3.5 - 5.2 g/dL    AST 213 (*) 0 - 37 U/L ICTERIC SPECIMEN   ALT 41  0 - 53 U/L    Alkaline Phosphatase 54  39 - 117 U/L    Total Bilirubin 18.8 (*) 0.3 - 1.2 mg/dL    GFR calc non Af Amer 34 (*) >90 mL/min    GFR calc Af Amer 39 (*) >90 mL/min   MAGNESIUM     Status: Normal   Collection Time   03/13/12 11:25 AM      Component Value Range Comment   Magnesium 1.7  1.5 - 2.5  mg/dL   PHOSPHORUS     Status: Normal   Collection Time   03/13/12 11:25 AM      Component Value Range Comment   Phosphorus 4.3  2.3 - 4.6 mg/dL   LACTIC ACID, PLASMA     Status: Abnormal   Collection Time   03/13/12 11:25 AM      Component Value Range Comment   Lactic Acid, Venous 6.5 (*) 0.5 - 2.2 mmol/L   FERRITIN     Status: Abnormal   Collection Time   03/13/12 11:25 AM      Component Value Range Comment   Ferritin 1534 (*) 22 -  322 ng/mL Result repeated and verified.  PRO B NATRIURETIC PEPTIDE     Status: Abnormal   Collection Time   03/13/12 11:25 AM      Component Value Range Comment   Pro B Natriuretic peptide (BNP) 9075.0 (*) 0 - 125 pg/mL   MRSA PCR SCREENING     Status: Normal   Collection Time   03/13/12  4:19 PM      Component Value Range Comment   MRSA by PCR NEGATIVE  NEGATIVE   URINALYSIS, ROUTINE W REFLEX MICROSCOPIC     Status: Abnormal   Collection Time   03/14/12  1:11 AM      Component Value Range Comment   Color, Urine RED (*) YELLOW BIOCHEMICALS MAY BE AFFECTED BY COLOR   APPearance CLOUDY (*) CLEAR    Specific Gravity, Urine 1.015  1.005 - 1.030    pH 6.0  5.0 - 8.0    Glucose, UA 100 (*) NEGATIVE mg/dL    Hgb urine dipstick LARGE (*) NEGATIVE    Bilirubin Urine MODERATE (*) NEGATIVE    Ketones, ur TRACE (*) NEGATIVE mg/dL    Protein, ur >161 (*) NEGATIVE mg/dL    Urobilinogen, UA 1.0  0.0 - 1.0 mg/dL    Nitrite NEGATIVE  NEGATIVE    Leukocytes, UA TRACE (*) NEGATIVE   URINE MICROSCOPIC-ADD ON     Status: Abnormal   Collection Time   03/14/12  1:11 AM      Component Value Range Comment   Squamous Epithelial / LPF FEW (*) RARE    WBC, UA 3-6  <3 WBC/hpf    RBC / HPF 7-10  <3 RBC/hpf    Crystals BILIRUBIN CRYSTALS (*) NEGATIVE    Urine-Other MUCOUS PRESENT   AMORPHOUS URATES/PHOSPHATES  COMPREHENSIVE METABOLIC PANEL     Status: Abnormal   Collection Time   03/14/12  6:00 AM      Component Value Range Comment   Sodium 132 (*) 135 - 145 mEq/L     Potassium 6.5 (*) 3.5 - 5.1 mEq/L    Chloride 100  96 - 112 mEq/L    CO2 20  19 - 32 mEq/L    Glucose, Bld 114 (*) 70 - 99 mg/dL    BUN 41 (*) 6 - 23 mg/dL    Creatinine, Ser 0.96 (*) 0.50 - 1.35 mg/dL    Calcium 8.5  8.4 - 04.5 mg/dL    Total Protein 6.6  6.0 - 8.3 g/dL ICTERUS AT THIS LEVEL MAY AFFECT RESULT   Albumin 3.3 (*) 3.5 - 5.2 g/dL    AST 409 (*) 0 - 37 U/L    ALT 154 (*) 0 - 53 U/L    Alkaline Phosphatase 61  39 - 117 U/L    Total Bilirubin 19.5 (*) 0.3 - 1.2 mg/dL    GFR calc non Af Amer 14 (*) >90 mL/min    GFR calc Af Amer 17 (*) >90 mL/min   CBC     Status: Abnormal   Collection Time   03/14/12  6:00 AM      Component Value Range Comment   WBC 30.2 (*) 4.0 - 10.5 K/uL    RBC 2.73 (*) 4.22 - 5.81 MIL/uL    Hemoglobin 8.6 (*) 13.0 - 17.0 g/dL    HCT 81.1 (*) 91.4 - 52.0 %    MCV 84.2  78.0 - 100.0 fL    MCH 31.5  26.0 - 34.0 pg    MCHC 37.4 (*) 30.0 -  36.0 g/dL SICKLE CELLS   RDW 11.9 (*) 11.5 - 15.5 %    Platelets 165  150 - 400 K/uL   FERRITIN     Status: Abnormal   Collection Time   03/14/12  6:00 AM      Component Value Range Comment   Ferritin 1826 (*) 22 - 322 ng/mL Result confirmed by automatic dilution.  URIC ACID     Status: Abnormal   Collection Time   03/14/12  6:00 AM      Component Value Range Comment   Uric Acid, Serum 10.1 (*) 4.0 - 7.8 mg/dL   PREPARE RBC (CROSSMATCH)     Status: Normal   Collection Time   03/14/12  8:00 AM      Component Value Range Comment   Order Confirmation ORDER PROCESSED BY BLOOD BANK     POTASSIUM     Status: Abnormal   Collection Time   03/14/12 11:50 AM      Component Value Range Comment   Potassium 5.8 (*) 3.5 - 5.1 mEq/L     Studies/Results: US Abdomen Complete  03/13/2012  *RADIOLOGY REPORT*  Clinical Data:  Abdominal pain, sickle cell disease  ABDOMINAL ULTRASOUND COMPLETE  Comparison:  11/24/2007  Findings:  Gallbladder:  Surgically removed  Common Bile Duct:  Within normal limits in caliber.  Liver: Mild  diffuse intrahepatic biliary prominence, nonspecific. This could be related to prior cholecystectomy.  Recommend correlation with bilirubin and liver function test.  Otherwise no focal hepatic abnormality or mass.  IVC:  Appears normal.  Pancreas:  No abnormality identified.  Spleen:  Limited visualization.  Spleen only measures 3.1 cm.  No definite focal abnormality.  Right kidney:  12 cm length.  Increased cortical echogenicity without hydronephrosis.  Prominent medullary pyramids.  Small amount of perinephric fluid in the right upper quadrant along the liver and splenic margin.  This is compatible with nonobstructive medical renal disease.  Left kidney:  11.8 cm length.  Increased cortical echogenicity without hydronephrosis.  Prominent medullary pyramids.  No focal abnormality.  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  IMPRESSION: Previous cholecystectomy.  Nonspecific slight biliary prominence throughout the liver without definite obstruction.  Common bile duct measures 4 mm.  Recommend correlation with serum bilirubin and LFTs.  Increased renal echogenicity bilaterally with a small amount of free fluid about the right kidney and liver margin.  Suspect related to medical renal disease.  No hydronephrosis or obstruction  Original Report Authenticated By: Judie Petit. Ruel Favors, M.D.   Ir Fluoro Guide Cv Line Right  03/13/2012  *RADIOLOGY REPORT*  PICC PLACEMENT WITH ULTRASOUND AND FLUOROSCOPIC  GUIDANCE  Clinical History: 26 year old male with sickle cell disease and pain crisis, also concern for possible sepsis.  Requires durable central venous access.  Fluoroscopy Time: 0.6 minutes.  PROCEDURE/FINDINGS:  The right arm was prepped with chlorhexidine, draped in the usual sterile fashion using maximum barrier technique (cap and mask, sterile gown, sterile gloves, large sterile sheet, hand hygiene and cutaneous antiseptic).  Local anesthesia was attained  by infiltration with 1% lidocaine.  Ultrasound demonstrated patency of the basilic vein, and this was documented with an image.  Under real-time ultrasound guidance, this vein was accessed with a 21 gauge micropuncture needle and image documentation was performed.  The needle was exchanged over a guidewire for a peel-away sheath through which a 40 cm  5 [place] dual lumen power injectable PICC was advanced, and positioned with its tip  at the lower SVC/right atrial junction.  Fluoroscopy during the procedure and fluoro spot radiograph confirms appropriate catheter position.  The catheter was flushed, secured to the skin with Prolene sutures, and covered with a sterile dressing.  Complications:  None.  The patient tolerated the procedure well.  IMPRESSION: Successful placement of a right arm dual lumen power injectable PICC with sonographic and fluoroscopic guidance.  The catheter is ready for use.  Signed,  Sterling Big, MD Vascular & Interventional Radiologist [facility]  Original Report Authenticated By: Alvino Blood Chest Port 1 View  03/13/2012  *RADIOLOGY REPORT*  Clinical Data: Hypoxia.  Sickle cell crisis.  PORTABLE CHEST - 1 VIEW  Comparison: 03/12/2012  Findings: Heart is enlarged.  Right-sided PICC line tip overlies the level of the superior vena cava.  There are no focal consolidations or pleural effusions.  Slight interstitial prominence appears stable.  IMPRESSION: Stable cardiomegaly.  Original Report Authenticated By: Patterson Hammersmith, M.D.    Patient Active Problem List  Diagnosis  . Left foot pain  . Hypoxia  . Sickle cell disease  . Osteonecrosis  . Marijuana use  . Sickle cell crisis  . Hyperkalemia  . Abdominal pain  . Elevated brain natriuretic peptide (BNP) level  . Acute renal failure  . Hemochromatosis     Impression: Acute renal failure: Secondary to sickle cell nephropathy and possible ATN from NSAID use. Hyperbilirubinemia. Associated with active  hemolysis. Rule out cholestatic jaundice as well. Ultrasound showed no evidence for obstruction. Hyperkalemia. Secondary to acute renal failure. Sickle cell lung disease. Pro BNP greater than 9000. Patient unable to use incentive spirometry. Leukocytosis. Rule out occult infection vs secondary to sickle cell crisis. History of tobacco abuse. History of substance abuse. Secondary hemachromatosis. Hyperuricemia.  Plan: Begin exchange transfusions. Kayexalate for hyperkalemia. Caution with excessive IV fluids. Patient with decreasing oncotic pressures.This may aggravate patient's acute chest syndrome as well. Allopurinol as tolerated. Repeat renal functions this afternoon. Renal consult if no improvement and/or  further worsening.   August Saucer, Mylissa Lambe 03/14/2012 3:34 PM

## 2012-03-14 NOTE — Progress Notes (Addendum)
ANTIBIOTIC CONSULT NOTE - FOLLOW UP  Pharmacy Consult for Primaxin Indication: rule out sepsis  Allergies  Allergen Reactions  . Codeine Hives    Patient Measurements: Height: 5\' 11"  (180.3 cm) Weight: 143 lb 15.4 oz (65.3 kg) IBW/kg (Calculated) : 75.3   Vital Signs: Temp: 99.2 F (37.3 C) (06/14 0400) Temp src: Oral (06/14 0400) BP: 126/76 mmHg (06/14 0400) Pulse Rate: 87  (06/14 0400) Intake/Output from previous day: 06/13 0701 - 06/14 0700 In: 3103.5 [I.V.:1935; Blood:968.5; IV Piggyback:200] Out: 200 [Urine:200] Intake/Output from this shift:    Labs:  Basename 03/14/12 0600 03/13/12 1125 03/13/12 0805 03/13/12 0530  WBC 30.2* -- 22.0* 20.3*  HGB 8.6* -- 5.9* 5.3*  PLT 165 -- 164 159  LABCREA -- -- -- --  CREATININE 5.20* 2.55* -- 1.86*   Estimated Creatinine Clearance: 20.4 ml/min (by C-G formula based on Cr of 5.2).  Microbiology: Recent Results (from the past 720 hour(s))  MRSA PCR SCREENING     Status: Normal   Collection Time   03/13/12  4:19 PM      Component Value Range Status Comment   MRSA by PCR NEGATIVE  NEGATIVE Final     Assessment: 25 YOM with SS, renal failure on day #2 Primaxin for r/o Sepsis.  WBC increasing and SCr increasing significantly, warranting a dose adjustment to Primaxin today.  CrCl(N)~ 22 ml/min. No cultures pending yet.  Goal of Therapy:  dose adjusted per renal clearance  Plan:  Adjust Primaxin to 250 mg IV q8h. F/u Scr.  Clance Boll 03/14/2012,8:04 AM   SCr up 6.6, CrCl 92ml/min/1.73m2 Will change primaxin to 250mg  IV q12h.  Gwen Her PharmD  [phone] 03/14/2012 9:31 PM

## 2012-03-14 NOTE — Progress Notes (Signed)
CRITICAL VALUE ALERT  Critical value received:  Potassium 6.5, Bilirubin 19.5  Date of notification:  03/14/12  Time of notification:  0714  Critical value read back:yes  Nurse who received alert:  S.Young,RN  MD notified (1st page):    Time of first page:    MD notified (2nd page):  Time of second page:  Responding MD:    Time MD responded:

## 2012-03-14 NOTE — Progress Notes (Signed)
Order given to in and out cath pt q6 hours prn. 200cc of dark tea colored urine obtained. Pt tolerated in and out cath well. Sample sent for urinalysis per previous order.

## 2012-03-14 NOTE — Progress Notes (Signed)
Dr. August Saucer at [facility]. Notified of urine output and patient condition. Orders received.

## 2012-03-15 DIAGNOSIS — R109 Unspecified abdominal pain: Secondary | ICD-10-CM

## 2012-03-15 DIAGNOSIS — N179 Acute kidney failure, unspecified: Secondary | ICD-10-CM

## 2012-03-15 DIAGNOSIS — D5700 Hb-SS disease with crisis, unspecified: Principal | ICD-10-CM

## 2012-03-15 DIAGNOSIS — R799 Abnormal finding of blood chemistry, unspecified: Secondary | ICD-10-CM

## 2012-03-15 LAB — COMPREHENSIVE METABOLIC PANEL
ALT: 135 U/L — ABNORMAL HIGH (ref 0–53)
Alkaline Phosphatase: 48 U/L (ref 39–117)
BUN: 59 mg/dL — ABNORMAL HIGH (ref 6–23)
CO2: 23 mEq/L (ref 19–32)
Chloride: 97 mEq/L (ref 96–112)
GFR calc Af Amer: 17 mL/min — ABNORMAL LOW (ref >90)
GFR calc non Af Amer: 15 mL/min — ABNORMAL LOW (ref >90)
Glucose, Bld: 124 mg/dL — ABNORMAL HIGH (ref 70–99)
Potassium: 3.9 mEq/L (ref 3.5–5.1)
Sodium: 134 mEq/L — ABNORMAL LOW (ref 135–145)
Total Bilirubin: 37.8 mg/dL (ref 0.3–1.2)

## 2012-03-15 LAB — CBC
HCT: 21.1 % — ABNORMAL LOW (ref 39.0–52.0)
MCHC: 37.9 g/dL — ABNORMAL HIGH (ref 30.0–36.0)
Platelets: 124 10*3/uL — ABNORMAL LOW (ref 150–400)
RDW: 18.6 % — ABNORMAL HIGH (ref 11.5–15.5)

## 2012-03-15 LAB — RENAL FUNCTION PANEL
CO2: 19 mEq/L (ref 19–32)
Calcium: 8.2 mg/dL — ABNORMAL LOW (ref 8.4–10.5)
Creatinine, Ser: 6.13 mg/dL — ABNORMAL HIGH (ref 0.50–1.35)
Glucose, Bld: 122 mg/dL — ABNORMAL HIGH (ref 70–99)
Phosphorus: 4.5 mg/dL (ref 2.3–4.6)

## 2012-03-15 LAB — LACTATE DEHYDROGENASE: LDH: 2063 U/L — ABNORMAL HIGH (ref 94–250)

## 2012-03-15 MED ORDER — SODIUM CHLORIDE 0.9 % IV SOLN: 250.0000 mg | Freq: Two times a day (BID) | INTRAVENOUS | Status: DC

## 2012-03-15 MED ORDER — PRISMASOL BGK 4/2.5 32-4-2.5 MEQ/L IV SOLN
INTRAVENOUS | Status: DC
  Administered 2012-03-15: 08:00:00 via INTRAVENOUS_CENTRAL
  Filled 2012-03-15: qty 5000

## 2012-03-15 MED ORDER — FENTANYL CITRATE 0.05 MG/ML IJ SOLN: 50.0000 ug | INTRAMUSCULAR | Status: DC | PRN

## 2012-03-15 MED ORDER — ONDANSETRON HCL 4 MG PO TABS: 4.0000 mg | ORAL_TABLET | Freq: Four times a day (QID) | ORAL | Status: AC | PRN

## 2012-03-15 MED ORDER — SODIUM BICARBONATE 8.4 % IV SOLN: INTRAVENOUS | Status: DC

## 2012-03-15 MED ORDER — PRISMASOL BGK 4/2.5 32-4-2.5 MEQ/L IV SOLN
INTRAVENOUS | Status: DC
  Administered 2012-03-15: 08:00:00 via INTRAVENOUS_CENTRAL
  Filled 2012-03-15 (×7): qty 5000

## 2012-03-15 MED ORDER — PANTOPRAZOLE SODIUM 40 MG PO TBEC: 40.0000 mg | DELAYED_RELEASE_TABLET | Freq: Two times a day (BID) | ORAL | Status: DC

## 2012-03-15 MED ORDER — HEPARIN SODIUM (PORCINE) 1000 UNIT/ML DIALYSIS: 1000.0000 [IU] | INTRAMUSCULAR | Status: DC | PRN

## 2012-03-15 MED ORDER — FENTANYL CITRATE 0.05 MG/ML IJ SOLN
50.0000 ug | INTRAMUSCULAR | Status: DC | PRN
  Administered 2012-03-15 (×4): 200 ug via INTRAVENOUS
  Filled 2012-03-15 (×4): qty 4

## 2012-03-15 NOTE — Progress Notes (Signed)
Pt transferred to [facility] via care link, report is given to the nurse on 2100. Pt is stable, alert and oriented, MD is at the bedside

## 2012-03-15 NOTE — Progress Notes (Signed)
eLink Physician-Brief Progress Note Patient Name: Philip Rios DOB: 04/03/1986 MRN: [id number]  Date of Service  03/15/2012   HPI/Events of Note   Dilaudid dc'ed earlier. But now patient c/o pain  eICU Interventions  Fentanyl prjn   Intervention Category Intermediate Interventions: Pain - evaluation and management  Ashauna Bertholf 03/15/2012, 12:38 AM

## 2012-03-15 NOTE — Discharge Summary (Signed)
Physician Discharge Summary     Patient ID: Philip Rios MRN: [id number] DOB/AGE: November 07, 1985 25 y.o.  Admit date: 03/12/2012 Discharge date: 03/15/2012  Admission Diagnoses: Renal failure Hyperkalemia Sickle cell crisis Discharge Diagnoses:  Principal Problem:  *Sickle cell crisis Active Problems:  Hypoxia  Sickle cell disease  Hyperkalemia  Abdominal pain  Elevated brain natriuretic peptide (BNP) level  Acute renal failure  Hemochromatosis   Significant Hospital tests/ studies/ interventions and procedures  Lines: R femoral HD  Echo 6/14- PA 45, EF 45%,   Brief Hospital course.  Patient is a 26 y/o AAM with PMHx of Sickle Cell disease who was admitted 6/12 due to chest pain and body aches and was founfd to have hgb 8, TB 13, O2 sat 88% on RA and he was admitted by IM to the floors. On the floors, patient was requiring more opioids for pain control and was transferred to the ICU due to drowsiness and for better pain control. Also noted creatinine has been worsening 0.57 (on admit) --> 1.87 --> 2.55 --> 5.20. K also increased to 6.2 and received Kayexalate. Pt was transfused with 1 unit of RBC 6/14.  Patient also received lasix today, but has not made urine in the last 24 hours. Critical care consult was requested on 6/14 due to altered mental status, worsening of creatinine, hyperkalemia and possible acute chest syndrome in a setting of Sickle Cell disease. Since being in the ICU he has been initiated on emergent Continuous Veno-vaso hemodialysis for metabolic acidosis, renal failure and hyperkalemia. Due to his sickle cell Crisis he will be transferred to [facility] for immediate full exchange therapy.   Hospital Course by systems and associated problems:  PULMONARY   Lab  03/14/12 2005  03/14/12 1550  03/13/12 1021   PHART  7.343*  7.341*  7.264*   PCO2ART  32.8*  34.7*  45.2*   PO2ART  69.5*  49.5*  0.0*   HCO3  17.3*  18.2*  19.8*   O2SAT  86.4  74.4  90.9     CXR:  Bibasilar atelectasis, mild pulm vasc congestion  A: Hypoxia, has Hx of asthma. Interval CXR demonstrating increasing R>L airspace disease. Concerned for developing acute chest syndrome. At this point he is doing well on n/c O2 support.  Recommendation -aggressive treatment of sickle cell crisis -supplemental O2 -f/u cxr  CARDIOVASCULAR   Lab  03/14/12 1700  03/13/12 1125   TROPONINI  --  --   LATICACIDVEN  --  6.5*   PROBNP  34701.0*  9075.0*     A: tachycardia resolved, HYPOVOLEMIA intravascular, SSD with mild PA htn, EF 45% : Likely due to anemia - improved with pos 2 lit P:  -keep volume status adequate.    RENAL   Lab  03/15/12 0651  03/15/12 0143  03/15/12 0142  03/14/12 1958  03/14/12 0600  03/13/12 1125   NA  134*  134*  --  130*  132*  135   K  3.9  4.7  --  --  --  --   CL  97  99  --  98  100  102   CO2  23  19  --  19  20  19    BUN  59*  67*  --  64*  41*  18   CREATININE  5.00*  6.13*  --  6.62*  5.20*  2.55*   CALCIUM  8.2*  8.2*  --  8.1*  8.5  8.5  MG  --  --  1.6  --  --  1.7   PHOS  5.3*  4.5  --  --  --  4.3    Intake/Output  06/14 0701 - 06/15 0700 06/15 0701 - 06/16 0700  P.O. 600  I.V. (mL/kg) 1850.7 (27.6) 150 (2.2)  Blood 362.5  IV Piggyback 200  Total Intake(mL/kg) 3013.2 (45) 150 (2.2)  Urine (mL/kg/hr) 4 (0) 0  Other 852 66  Total Output 856 66  Net +2157.2 +84    A: Acute Renal failure, metabolic acidosis and hyperkalemia: in setting of Dehydration, rule out ATN  - R/o pigment nephropathy due to hemolysis   -better after starting CRRT.  Recommendation  -transfer to medical ICU bed at [facility] will likely still require CRRT until stabilizes.   GASTROINTESTINAL   Lab  03/15/12 0651  03/15/12 0143  03/14/12 0600  03/13/12 1125  03/12/12 0310   AST  137*  --  276*  172*  80*   ALT  135*  --  154*  41  32   ALKPHOS  48  --  61  54  73   BILITOT  37.8*  --  19.5*  18.8*  13.7*   PROT  5.4*  --  6.6  6.7  7.7   ALBUMIN  2.6*  2.7*  3.3*   3.4*  4.1    A: Elevated LFTs: Likely due to Hemolysis  - TB 37!!  P: - Follow LFTs  -assess ldh now and in am, assess indirect  -See heme  -Npo  -ppi   HEMATOLOGIC   Lab  03/15/12 0651  03/14/12 0600  03/13/12 0805  03/13/12 0530  03/12/12 0310   HGB  8.0*  8.6*  5.9*  5.3*  8.0*   HCT  21.1*  23.0*  14.1*  12.6*  20.0*   PLT  124*  165  164  159  229   INR  --  --  --  --  --   APTT  --  --  --  --  --    A: Severe SS crisis with MODS - Active hemolysis  - Was provided partial blood exchange transfusion at [facility], this pt likely needs immediate full Xchange Tx.    INFECTIOUS   Lab  03/15/12 0651  03/14/12 0600  03/13/12 0805  03/13/12 0530  03/12/12 0310   WBC  33.9*  30.2*  22.0*  20.3*  6.9   PROCALCITON  --  --  --  --  --    Cultures:  Blood pending 6/14>>> Urine cult  6/14>>>  Antibiotics:  Imipenem  A: R/o acute chest syndrome  P:  CXR with pulm vasc congestion and possible bibasilar infiltrates  - Will cont to cover with Imipenem, low threshold to dc  -CT chest when able   NEUROLOGIC  A: Altered mental status from dilauded insetting ARF / MODS  P:  Likely due to pain management  - dc'ed dilaudid PCA for now  Prn fent  No CT at this stage needed  Discharge Exam: BP 113/67  Pulse 86  Temp 98.3 F (36.8 C) (Oral)  Resp 21  Ht 5\' 11"  (1.803 m)  Wt 67 kg (147 lb 11.3 oz)  BMI 20.60 kg/m2  SpO2 100%  Physical Examination:  Neuro: drowsy but easily arousable, oriented x3, no resp distress.  HEENT: + JVD, no lymphadenopathy  Cardiovascular: Tachycardic resolved rhythm, S1 and S2.  Lungs: reduced, coarse bases  Abdomen: Soft, non  tender, non distended, BS active, no peritoneal signs  Musculoskeletal: Generalized muscleskeletal pain  Skin: No rash   Labs at discharge Lab Results  Component Value Date   CREATININE 5.00* 03/15/2012   BUN 59* 03/15/2012   NA 134* 03/15/2012   K 3.9 03/15/2012   CL 97 03/15/2012   CO2 23 03/15/2012   Lab Results    Component Value Date   WBC 33.9* 03/15/2012   HGB 8.0* 03/15/2012   HCT 21.1* 03/15/2012   MCV 85.1 03/15/2012   PLT 124* 03/15/2012   Lab Results  Component Value Date   ALT 135* 03/15/2012   AST 137* 03/15/2012   ALKPHOS 48 03/15/2012   BILITOT 37.8* 03/15/2012   Lab Results  Component Value Date   INR 1.4 11/25/2007    Current radiology studies US Abdomen Complete  03/13/2012  *RADIOLOGY REPORT*  Clinical Data:  Abdominal pain, sickle cell disease  ABDOMINAL ULTRASOUND COMPLETE  Comparison:  11/24/2007  Findings:  Gallbladder:  Surgically removed  Common Bile Duct:  Within normal limits in caliber.  Liver: Mild diffuse intrahepatic biliary prominence, nonspecific. This could be related to prior cholecystectomy.  Recommend correlation with bilirubin and liver function test.  Otherwise no focal hepatic abnormality or mass.  IVC:  Appears normal.  Pancreas:  No abnormality identified.  Spleen:  Limited visualization.  Spleen only measures 3.1 cm.  No definite focal abnormality.  Right kidney:  12 cm length.  Increased cortical echogenicity without hydronephrosis.  Prominent medullary pyramids.  Small amount of perinephric fluid in the right upper quadrant along the liver and splenic margin.  This is compatible with nonobstructive medical renal disease.  Left kidney:  11.8 cm length.  Increased cortical echogenicity without hydronephrosis.  Prominent medullary pyramids.  No focal abnormality.  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  IMPRESSION: Previous cholecystectomy.  Nonspecific slight biliary prominence throughout the liver without definite obstruction.  Common bile duct measures 4 mm.  Recommend correlation with serum bilirubin and LFTs.  Increased renal echogenicity bilaterally with a small amount of free fluid about the right kidney and liver margin.  Suspect related to medical renal disease.  No hydronephrosis or obstruction   Original Report Authenticated By: Judie Petit. Ruel Favors, M.D.   Ir Fluoro Guide Cv Line Right  03/13/2012  *RADIOLOGY REPORT*  PICC PLACEMENT WITH ULTRASOUND AND FLUOROSCOPIC  GUIDANCE  Clinical History: 26 year old male with sickle cell disease and pain crisis, also concern for possible sepsis.  Requires durable central venous access.  Fluoroscopy Time: 0.6 minutes.  PROCEDURE/FINDINGS:  The right arm was prepped with chlorhexidine, draped in the usual sterile fashion using maximum barrier technique (cap and mask, sterile gown, sterile gloves, large sterile sheet, hand hygiene and cutaneous antiseptic).  Local anesthesia was attained by infiltration with 1% lidocaine.  Ultrasound demonstrated patency of the basilic vein, and this was documented with an image.  Under real-time ultrasound guidance, this vein was accessed with a 21 gauge micropuncture needle and image documentation was performed.  The needle was exchanged over a guidewire for a peel-away sheath through which a 40 cm  5 [place] dual lumen power injectable PICC was advanced, and positioned with its tip at the lower SVC/right atrial junction.  Fluoroscopy during the procedure and fluoro spot radiograph confirms appropriate catheter position.  The catheter was flushed, secured to the skin with Prolene sutures, and covered with a sterile dressing.  Complications:  None.  The patient tolerated the procedure  well.  IMPRESSION: Successful placement of a right arm dual lumen power injectable PICC with sonographic and fluoroscopic guidance.  The catheter is ready for use.  Signed,  Sterling Big, MD Vascular & Interventional Radiologist [facility] Radiology  Original Report Authenticated By: Alvino Blood Chest Port 1 View  03/14/2012  *RADIOLOGY REPORT*  Clinical Data: Shortness of breath.  Confusion.  Evaluate for acute chest syndrome.  PORTABLE CHEST - 1 VIEW  Comparison: Chest x-ray 03/13/2012.  Findings: There is a right upper extremity PICC with tip  terminating in the superior aspect of the right atrium. Lung volumes are low.  There is pulmonary venous congestion without frank pulmonary edema.  Mild cardiomegaly is unchanged. Retrocardiac opacity on the left is favored to represent subsegmental atelectasis, however, an area of airspace consolidation is difficult to exclude.  Mediastinal contours are unremarkable.  IMPRESSION: 1.  Interval development of pulmonary venous congestion.  No frank pulmonary edema. 2.  Mild cardiomegaly is unchanged. 3.  Interval development of a left retrocardiac opacity favored to represent atelectasis.  This could be better evaluated with a standing PA and lateral chest radiographs if clinically indicated.  Original Report Authenticated By: Florencia Reasons, M.D.   Dg Chest Port 1 View  03/13/2012  *RADIOLOGY REPORT*  Clinical Data: Hypoxia.  Sickle cell crisis.  PORTABLE CHEST - 1 VIEW  Comparison: 03/12/2012  Findings: Heart is enlarged.  Right-sided PICC line tip overlies the level of the superior vena cava.  There are no focal consolidations or pleural effusions.  Slight interstitial prominence appears stable.  IMPRESSION: Stable cardiomegaly.  Original Report Authenticated By: Patterson Hammersmith, M.D.    Disposition:  01-Home or Self Care  Discharge Orders    Future Orders Please Complete By Expires   Diet - low sodium heart healthy      Increase activity slowly        Medication List  As of 03/15/2012 11:07 AM   TAKE these medications         dextrose 5 % SOLN 850 mL with sodium bicarbonate 1 mEq/mL SOLN 150 mEq   Continue at 75 ml./mr      fentaNYL 0.05 MG/ML injection   Commonly known as: SUBLIMAZE   Inject 1-4 mLs (50-200 mcg total) into the vein every 2 (two) hours as needed for severe pain (hold for RASS deeper than -2).      folic acid 1 MG tablet   Commonly known as: FOLVITE   Take 1 tablet (1 mg total) by mouth daily.      heparin 1000 unit/mL Soln injection   1-6 mLs (1,000-6,000 Units  total) by CRRT route as needed (Use to fill CRRT catheter with heparin 1000 units/mL per catheter volume.).      HYDROcodone-acetaminophen 5-325 MG per tablet   Commonly known as: NORCO   Take 1-2 tablets by mouth every 6 (six) hours as needed. For pain      hydroxyurea 500 MG capsule   Commonly known as: HYDREA   Take 2-3 capsules (1,000-1,500 mg total) by mouth daily. Alternates between 2 and 3 tablets each day.      ondansetron 4 MG tablet   Commonly known as: ZOFRAN   Take 1 tablet (4 mg total) by mouth every 6 (six) hours as needed for nausea.      pantoprazole 40 MG tablet   Commonly known as: PROTONIX   Take 1 tablet (40 mg total) by mouth 2 (two) times daily with a  meal.      sodium chloride 0.9 % SOLN 100 mL with imipenem-cilastatin 250 MG SOLR 250 mg   Inject 250 mg into the vein every 12 (twelve) hours.             Discharged Condition: critical  Physician Statement:   The Patient was personally examined, the discharge assessment and plan has been personally reviewed and I agree with ACNP Agamjot Kilgallon's assessment and plan. > 30 minutes of time have been dedicated to discharge assessment, planning and discharge instructions.   Signed: Shelden Raborn,PETE 03/15/2012, 11:07 AM

## 2012-03-15 NOTE — Consult Note (Signed)
Name: Philip Rios MRN: [id number] DOB: [birth year]    LOS: 3  Referring Provider:  Willey Blade, MD Reason for Referral:  Acute renal failure, altered mental status  PULMONARY / CRITICAL CARE MEDICINE  HPI:  Patient is a 26 y/o AAM with PMHx of SS disease who was admitted due to chest pain and body aches and was founfd to have hb 8, TB 13, O2 sat 88% on RA and he was admitted by IM to the floors. On the floors, patient was requiring more opioids for pain control and was transferred to the ICU due to drowsiness and for better pain control. Pt was transfused with 1 unit of RBC today and creatinine has been worsening 0.57 --> 1.87 --> 2.55 --> 5.20. K also increased to 6.2 and received Kayexalate. Patient also received lasix today, but has not made urine in the last 24 hours. Critical care consult was requested due to altered mental status, worsening of creatinine, hyperkalemia and possible acute chest syndrome in a setting of SSD. Pt denies any cough, sputum production or fevers.  Current Status: cvvhd continues, bili rising, lethargic  Vital Signs: Temp:  [98.1 F (36.7 C)-100.3 F (37.9 C)] 98.3 F (36.8 C) (06/15 0746) Pulse Rate:  [81-103] 81  (06/15 0800) Resp:  [17-34] 17  (06/15 0800) BP: (107-133)/(39-75) 119/70 mmHg (06/15 0800) SpO2:  [93 %-100 %] 100 % (06/15 0800) FiO2 (%):  [100 %] 100 % (06/14 2200) Weight:  [67 kg (147 lb 11.3 oz)] 67 kg (147 lb 11.3 oz) (06/15 0500)  Physical Examination: Neuro:  drowsy but easily arousable, oriented x3, no resp distress. HEENT:  + JVD, no lymphadenopathy Cardiovascular:  Tachycardic resolved rhythm, S1 and S2. Lungs: reduced, coarse bases Abdomen:  Soft, non tender, non distended, BS active, no peritoneal signs Musculoskeletal:  Generalized muscleskeletal pain Skin: No rash  Principal Problem:  *Sickle cell crisis Active Problems:  Hypoxia  Sickle cell disease  Hyperkalemia  Abdominal pain  Elevated brain natriuretic peptide  (BNP) level  Acute renal failure  Hemochromatosis   ASSESSMENT AND PLAN  PULMONARY  Lab 03/14/12 2005 03/14/12 1550 03/13/12 1021  PHART 7.343* 7.341* 7.264*  PCO2ART 32.8* 34.7* 45.2*  PO2ART 69.5* 49.5* 0.0*  HCO3 17.3* 18.2* 19.8*  O2SAT 86.4 74.4 90.9   Ventilator Settings: Vent Mode:  [-]  FiO2 (%):  [100 %] 100 % CXR:  Bibasilar atelectasis, mild pulm vasc congestion  A:  Hypoxia  Hx of asthma P:  Possible acute chest syndrome, edema -abg reviewed, no indication intubation -treat SS crosuis aggressive, consider transfer for full Xchange Tx, see heme -pcxr, low threshold CT chest -O2 support  CARDIOVASCULAR  Lab 03/14/12 1700 03/13/12 1125  TROPONINI -- --  LATICACIDVEN -- 6.5*  PROBNP 34701.0* 9075.0*    Lines:  R femoral HD Echo 6/14- PA 45, EF 45%, triavial Per eff  A: tachycardia resolved, HYPOVOLEMIA intravascular, SSD with mild PA htn, EF 45% P:  Likely due to anemia - improved with pos 2 lit NO lasix Echo reviewed, some pulm htn  RENAL  Lab 03/15/12 0651 03/15/12 0143 03/15/12 0142 03/14/12 1958 03/14/12 0600 03/13/12 1125  NA 134* 134* -- 130* 132* 135  K 3.9 4.7 -- -- -- --  CL 97 99 -- 98 100 102  CO2 23 19 -- 19 20 19   BUN 59* 67* -- 64* 41* 18  CREATININE 5.00* 6.13* -- 6.62* 5.20* 2.55*  CALCIUM 8.2* 8.2* -- 8.1* 8.5 8.5  MG -- -- 1.6 -- --  1.7  PHOS 5.3* 4.5 -- -- -- 4.3   Intake/Output      06/14 0701 - 06/15 0700 06/15 0701 - 06/16 0700   P.O. 600    I.V. (mL/kg) 1850.7 (27.6) 150 (2.2)   Blood 362.5    IV Piggyback 200    Total Intake(mL/kg) 3013.2 (45) 150 (2.2)   Urine (mL/kg/hr) 4 (0) 0   Other 852 66   Total Output 856 66   Net +2157.2 +84         Foley:  pending  A:  Acute Renal failure P:  Dehydration, rule out ATN - R/o pigment nephropathy due to hemolysis - K improved Continue cvvhd per renal  GASTROINTESTINAL  Lab 03/15/12 0651 03/15/12 0143 03/14/12 0600 03/13/12 1125 03/12/12 0310  AST 137* -- 276*  172* 80*  ALT 135* -- 154* 41 32  ALKPHOS 48 -- 61 54 73  BILITOT 37.8* -- 19.5* 18.8* 13.7*  PROT 5.4* -- 6.6 6.7 7.7  ALBUMIN 2.6* 2.7* 3.3* 3.4* 4.1    A:  Elevated LFTs P:  Likely due to Hemolysis - TB 37!! - Follow LFTs -assess ldh now and in am, assess indirect , i see none of these on chart -See heme Npo ppi  HEMATOLOGIC  Lab 03/15/12 0651 03/14/12 0600 03/13/12 0805 03/13/12 0530 03/12/12 0310  HGB 8.0* 8.6* 5.9* 5.3* 8.0*  HCT 21.1* 23.0* 14.1* 12.6* 20.0*  PLT 124* 165 164 159 229  INR -- -- -- -- --  APTT -- -- -- -- --   A:  Severe SS crisis with MODS P:  - Active hemolysis - Was provided partial blood exchange transfusion at [facility], this pt likely needs immediate full Xchange Tx, I will notify heme and call [facility] -I will assess ldh, indirect bili, lft in am  -feel strongly should Tx him for full Xchange -Tx 2 units STAT PRBC -NO lasix, follow resp status on cvvhd Folic acid Hydration cautionm with EF, he remains on limited O2 support  INFECTIOUS  Lab 03/15/12 0651 03/14/12 0600 03/13/12 0805 03/13/12 0530 03/12/12 0310  WBC 33.9* 30.2* 22.0* 20.3* 6.9  PROCALCITON -- -- -- -- --   Cultures: Blood pending Urine cult  Antibiotics: Imipenem  A: R/o acute chest syndrome   P:   CXR with pulm vasc congestion and possible bibasilar infiltrates - Will cont to cover with Imipenem, low threshold to dc -CT chest when able  NEUROLOGIC  A: Altered mental status from dilauded insetting ARF / MODS P:   Likely due to pain management - dc'ed dilaudid PCA for now Prn fent No CT at this stage needed  BEST PRACTICE / DISPOSITION Level of Care:  ICU Primary Service:  PCCM Consultants:  Nephrology Code Status:  Full code Diet:  NPO for now DVT Px:  SCDs  Ccm time 30 min   Nelda Bucks., M.D. Pulmonary and Critical Care Medicine [facility] Pager: [phone]  03/15/2012, 9:02 AM

## 2012-03-15 NOTE — Progress Notes (Addendum)
Pt Labs called to Dr Hyman Hopes per his request, voice message left with lab results.

## 2012-03-15 NOTE — Progress Notes (Signed)
Patient discharged in critical but stable condition to [facility] team at 1113 am.

## 2012-03-15 NOTE — Progress Notes (Signed)
CRITICAL VALUE ALERT  Critical value received:  Total Bilirubin 37.8  Date of notification:  03/15/2012  Time of notification:  0829  Critical value read back:yes  Nurse who received alert:  Nancy Nordmann, RN  MD notified (1st page):  Dr. Tyson Alias  Time of first page:  0830  MD notified (2nd page):  Time of second page:  Responding MD:  Dr. Tyson Alias  Time MD responded:  0830

## 2012-03-15 NOTE — Progress Notes (Signed)
Subjective:  No new complaints. No urine output. On hemodialysis   Objective:  Vital Signs in the last 24 hours: Temp:  [98.1 F (36.7 C)-100.3 F (37.9 C)] 98.3 F (36.8 C) (06/15 0746) Pulse Rate:  [81-103] 81  (06/15 0800) Cardiac Rhythm:  [-] Normal sinus rhythm (06/15 0800) Resp:  [17-34] 17  (06/15 0800) BP: (107-133)/(39-75) 119/70 mmHg (06/15 0800) SpO2:  [93 %-100 %] 100 % (06/15 0800) FiO2 (%):  [100 %] 100 % (06/14 2200) Weight:  [67 kg (147 lb 11.3 oz)] 67 kg (147 lb 11.3 oz) (06/15 0500)  Physical Exam: BP Readings from Last 1 Encounters:  03/15/12 119/70    Wt Readings from Last 1 Encounters:  03/15/12 67 kg (147 lb 11.3 oz)    Weight change: 1.7 kg (3 lb 12 oz)  HEENT: [place]/AT, Eyes-Brown, PERL, EOMI, Conjunctiva-Pale, Sclera-icteric Neck: No JVD, No bruit, Trachea midline. Lungs:  Clear, anteriorly. Left sided dullness.  Cardiac:  Regular rhythm, normal S1 and S2, no S3.  Abdomen:  Soft, non-tender. Extremities:  No edema present. No cyanosis. No clubbing. CNS: AxOx3, Cranial nerves grossly intact, moves all 4 extremities. Right handed. Skin: Warm and dry.   Intake/Output from previous day: 06/14 0701 - 06/15 0700 In: 3013.2 [P.O.:600; I.V.:1850.7; Blood:362.5; IV Piggyback:200] Out: 856 [Urine:4]    Lab Results: BMET    Component Value Date/Time   NA 134* 03/15/2012 0651   K 3.9 03/15/2012 0651   CL 97 03/15/2012 0651   CO2 23 03/15/2012 0651   GLUCOSE 124* 03/15/2012 0651   BUN 59* 03/15/2012 0651   CREATININE 5.00* 03/15/2012 0651   CALCIUM 8.2* 03/15/2012 0651   GFRNONAA 15* 03/15/2012 0651   GFRAA 17* 03/15/2012 0651   CBC    Component Value Date/Time   WBC 33.9* 03/15/2012 0651   RBC 2.48* 03/15/2012 0651   HGB 8.0* 03/15/2012 0651   HCT 21.1* 03/15/2012 0651   PLT 124* 03/15/2012 0651   MCV 85.1 03/15/2012 0651   MCH 32.3 03/15/2012 0651   MCHC 37.9* 03/15/2012 0651   RDW 18.6* 03/15/2012 0651   LYMPHSABS 2.0 02/26/2012 0920   MONOABS 1.0 02/26/2012  0920   EOSABS 0.8* 02/26/2012 0920   BASOSABS 0.1 02/26/2012 0920   CARDIAC ENZYMES Lab Results  Component Value Date   CKTOTAL 117 10/01/2010   CKMB 0.6 10/01/2010   TROPONINI  Value: 0.01        NO INDICATION OF MYOCARDIAL INJURY. 10/01/2010    Assessment/Plan:  Patient Active Hospital Problem List:  Acute renal failure  Hyperbilirubinemia.  Sickle cell lung disease.  Leukocytosis.  History of tobacco abuse.  History of substance abuse.  Secondary hemachromatosis.  Hyperuricemia.  Awaiting transfer to [place]/Dr. Rory Percy   LOS: 3 days    Orpah Cobb  MD  03/15/2012, 9:33 AM

## 2012-03-15 NOTE — Progress Notes (Signed)
[name] ROUNDING NOTE   Subjective:   Interval History: none.  Objective:  Vital signs in last 24 hours:  Temp:  [98.1 F (36.7 C)-100.3 F (37.9 C)] 98.1 F (36.7 C) (06/15 0402) Pulse Rate:  [81-103] 93  (06/15 0600) Resp:  [17-34] 24  (06/15 0600) BP: (106-133)/(39-75) 120/75 mmHg (06/15 0600) SpO2:  [93 %-100 %] 100 % (06/15 0600) FiO2 (%):  [4 %-100 %] 100 % (06/14 2200) Weight:  [67 kg (147 lb 11.3 oz)] 67 kg (147 lb 11.3 oz) (06/15 0500)  Weight change: 1.7 kg (3 lb 12 oz) Filed Weights   03/12/12 0647 03/13/12 1605 03/15/12 0500  Weight: 65.1 kg (143 lb 8.3 oz) 65.3 kg (143 lb 15.4 oz) 67 kg (147 lb 11.3 oz)    Intake/Output: I/O last 3 completed shifts: In: 4830 [P.O.:360; I.V.:2839; Blood:1331; IV Piggyback:300] Out: 604 [Urine:204; Other:400]   Intake/Output this shift:  Total I/O In: 1156.7 [P.O.:240; I.V.:816.7; IV Piggyback:100] Out: 362 [Other:362]  Awake voices no complaints CVS- RRR RS- CTA ABD- BS present soft non-distended  EXT- no edema Right femoral dialysis catheter   Basic Metabolic Panel:  Lab 03/15/12 6213 03/15/12 0142 03/14/12 1958 03/14/12 1150 03/14/12 0600 03/13/12 1125 03/13/12 0530  NA 134* -- 130* -- 132* 135 134*  K 4.7 -- 5.9* 5.8* 6.5* 6.1* --  CL 99 -- 98 -- 100 102 100  CO2 19 -- 19 -- 20 19 18*  GLUCOSE 122* -- 121* -- 114* 130* 188*  BUN 67* -- 64* -- 41* 18 11  CREATININE 6.13* -- 6.62* -- 5.20* 2.55* 1.86*  CALCIUM 8.2* -- 8.1* -- 8.5 -- --  MG -- 1.6 -- -- -- 1.7 --  PHOS 4.5 -- -- -- -- 4.3 --    Liver Function Tests:  Lab 03/15/12 0143 03/14/12 0600 03/13/12 1125 03/12/12 0310  AST -- 276* 172* 80*  ALT -- 154* 41 32  ALKPHOS -- 61 54 73  BILITOT -- 19.5* 18.8* 13.7*  PROT -- 6.6 6.7 7.7  ALBUMIN 2.7* 3.3* 3.4* 4.1   No results found for this basename: LIPASE:5,AMYLASE:5 in the last 168 hours  Lab 03/13/12 1125  AMMONIA RESULTS UNAVAILABLE DUE TO INTERFERING SUBSTANCE     CBC:  Lab 03/14/12 0600 03/13/12 0805 03/13/12 0530 03/12/12 0310  WBC 30.2* 22.0* 20.3* 6.9  NEUTROABS -- -- -- --  HGB 8.6* 5.9* 5.3* 8.0*  HCT 23.0* 14.1* 12.6* 20.0*  MCV 84.2 83.4 80.8 88.9  PLT 165 164 159 229    Cardiac Enzymes: No results found for this basename: CKTOTAL:5,CKMB:5,CKMBINDEX:5,TROPONINI:5 in the last 168 hours  BNP: No components found with this basename: POCBNP:5  CBG:  Lab 03/14/12 2256  GLUCAP 89    Microbiology: Results for orders placed during the hospital encounter of 03/12/12  MRSA PCR SCREENING     Status: Normal   Collection Time   03/13/12  4:19 PM      Component Value Range Status Comment   MRSA by PCR NEGATIVE  NEGATIVE Final     Coagulation Studies: No results found for this basename: LABPROT:5,INR:5 in the last 72 hours  Urinalysis:  Basename 03/14/12 0111  COLORURINE RED*  LABSPEC 1.015  PHURINE 6.0  GLUCOSEU 100*  HGBUR LARGE*  BILIRUBINUR MODERATE*  KETONESUR TRACE*  PROTEINUR >300*  UROBILINOGEN 1.0  NITRITE NEGATIVE  LEUKOCYTESUR TRACE*      Imaging: US Abdomen Complete  03/13/2012  *RADIOLOGY REPORT*  Clinical Data:  Abdominal pain, sickle cell disease  ABDOMINAL ULTRASOUND COMPLETE  Comparison:  11/24/2007  Findings:  Gallbladder:  Surgically removed  Common Bile Duct:  Within normal limits in caliber.  Liver: Mild diffuse intrahepatic biliary prominence, nonspecific. This could be related to prior cholecystectomy.  Recommend correlation with bilirubin and liver function test.  Otherwise no focal hepatic abnormality or mass.  IVC:  Appears normal.  Pancreas:  No abnormality identified.  Spleen:  Limited visualization.  Spleen only measures 3.1 cm.  No definite focal abnormality.  Right kidney:  12 cm length.  Increased cortical echogenicity without hydronephrosis.  Prominent medullary pyramids.  Small amount of perinephric fluid in the right upper quadrant along the liver and splenic margin.  This is compatible  with nonobstructive medical renal disease.  Left kidney:  11.8 cm length.  Increased cortical echogenicity without hydronephrosis.  Prominent medullary pyramids.  No focal abnormality.  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  IMPRESSION: Previous cholecystectomy.  Nonspecific slight biliary prominence throughout the liver without definite obstruction.  Common bile duct measures 4 mm.  Recommend correlation with serum bilirubin and LFTs.  Increased renal echogenicity bilaterally with a small amount of free fluid about the right kidney and liver margin.  Suspect related to medical renal disease.  No hydronephrosis or obstruction  Original Report Authenticated By: Judie Petit. Ruel Favors, M.D.   Ir Fluoro Guide Cv Line Right  03/13/2012  *RADIOLOGY REPORT*  PICC PLACEMENT WITH ULTRASOUND AND FLUOROSCOPIC  GUIDANCE  Clinical History: 26 year old male with sickle cell disease and pain crisis, also concern for possible sepsis.  Requires durable central venous access.  Fluoroscopy Time: 0.6 minutes.  PROCEDURE/FINDINGS:  The right arm was prepped with chlorhexidine, draped in the usual sterile fashion using maximum barrier technique (cap and mask, sterile gown, sterile gloves, large sterile sheet, hand hygiene and cutaneous antiseptic).  Local anesthesia was attained by infiltration with 1% lidocaine.  Ultrasound demonstrated patency of the basilic vein, and this was documented with an image.  Under real-time ultrasound guidance, this vein was accessed with a 21 gauge micropuncture needle and image documentation was performed.  The needle was exchanged over a guidewire for a peel-away sheath through which a 40 cm  5 [place] dual lumen power injectable PICC was advanced, and positioned with its tip at the lower SVC/right atrial junction.  Fluoroscopy during the procedure and fluoro spot radiograph confirms appropriate catheter position.  The catheter was flushed,  secured to the skin with Prolene sutures, and covered with a sterile dressing.  Complications:  None.  The patient tolerated the procedure well.  IMPRESSION: Successful placement of a right arm dual lumen power injectable PICC with sonographic and fluoroscopic guidance.  The catheter is ready for use.  Signed,  Sterling Big, MD Vascular & Interventional Radiologist [facility] Radiology  Original Report Authenticated By: Alvino Blood Chest Port 1 View  03/14/2012  *RADIOLOGY REPORT*  Clinical Data: Shortness of breath.  Confusion.  Evaluate for acute chest syndrome.  PORTABLE CHEST - 1 VIEW  Comparison: Chest x-ray 03/13/2012.  Findings: There is a right upper extremity PICC with tip terminating in the superior aspect of the right atrium. Lung volumes are low.  There is pulmonary venous congestion without frank pulmonary edema.  Mild cardiomegaly is unchanged. Retrocardiac opacity on the left is favored to represent subsegmental atelectasis, however, an area of airspace consolidation is difficult to exclude.  Mediastinal contours are unremarkable.  IMPRESSION: 1.  Interval development of pulmonary venous congestion.  No frank pulmonary edema. 2.  Mild cardiomegaly is unchanged. 3.  Interval development of a left retrocardiac opacity favored to represent atelectasis.  This could be better evaluated with a standing PA and lateral chest radiographs if clinically indicated.  Original Report Authenticated By: Florencia Reasons, M.D.   Dg Chest Port 1 View  03/13/2012  *RADIOLOGY REPORT*  Clinical Data: Hypoxia.  Sickle cell crisis.  PORTABLE CHEST - 1 VIEW  Comparison: 03/12/2012  Findings: Heart is enlarged.  Right-sided PICC line tip overlies the level of the superior vena cava.  There are no focal consolidations or pleural effusions.  Slight interstitial prominence appears stable.  IMPRESSION: Stable cardiomegaly.  Original Report Authenticated By: Patterson Hammersmith, M.D.     Medications:      .  dialysis replacement fluid (prismasate)    . dialysis replacement fluid (prismasate)    . dialysate (PRISMASATE)    .  sodium bicarbonate infusion 1000 mL 75 mL/hr at 03/14/12 2044  . DISCONTD: dextrose 5 % and 0.45% NaCl 100 mL/hr at 03/14/12 1908  . DISCONTD: dialysis replacement fluid (prismasate) 200 mL/hr at 03/14/12 2300  . DISCONTD: dialysis replacement fluid (prismasate) 300 mL/hr at 03/14/12 2300  . DISCONTD: dialysate (PRISMASATE) 2,000 mL/hr at 03/15/12 0316      . allopurinol  100 mg Oral Daily  . folic acid  1 mg Oral Daily  . furosemide  40 mg Intravenous Once  . hyoscyamine  0.375 mg Oral Q12H  . imipenem-cilastatin  250 mg Intravenous Q12H  . lidocaine  2 patch Transdermal Q24H  . pantoprazole  40 mg Oral BID WC  . sodium polystyrene  15 g Oral Once  . sodium polystyrene  30 g Oral Once  . vitamin A & D      . DISCONTD: HYDROmorphone PCA 0.3 mg/mL   Intravenous Q4H  . DISCONTD: HYDROmorphone PCA 0.3 mg/mL   Intravenous Q4H  . DISCONTD: imipenem-cilastatin  250 mg Intravenous Q6H  . DISCONTD: imipenem-cilastatin  250 mg Intravenous Q8H   acetaminophen, acetaminophen, fentaNYL, heparin, heparin, ondansetron (ZOFRAN) IV, ondansetron, DISCONTD: diphenhydrAMINE, DISCONTD: diphenhydrAMINE, DISCONTD: diphenhydrAMINE, DISCONTD: diphenhydrAMINE, DISCONTD: diphenhydrAMINE, DISCONTD:  HYDROmorphone (DILAUDID) injection, DISCONTD: naloxone (NARCAN) injection, DISCONTD: naloxone, DISCONTD: naloxone, DISCONTD: ondansetron (ZOFRAN) IV, DISCONTD: ondansetron (ZOFRAN) IV, DISCONTD: oxyCODONE DISCONTD: sodium chloride, DISCONTD: sodium chloride  Assessment/ Plan:   Acute renal failure in setting of sickle cell crisis, hypoxemia, hypovolemia and hemoglobinuria  Volume stable   Anemia Sickle cell s/p transfusion  Hyperkalemia resolved  Metabolic acidosis still on bicarbonate drip  Change the dialysate prescription to 4 K prismasate. Still anuric continue CVVHDF   LOS:  3 Philip Rios W @TODAY @6 :46 AM

## 2012-03-16 LAB — URINE CULTURE
Culture  Setup Time: 201306150152
Culture: NO GROWTH

## 2012-03-17 LAB — TYPE AND SCREEN
ABO/RH(D): O NEG
Antibody Screen: NEGATIVE
Unit division: 0

## 2012-03-17 LAB — HEPATITIS PANEL, ACUTE: Hep B C IgM: NEGATIVE

## 2012-03-17 LAB — HEMOGLOBINOPATHY EVALUATION
Hemoglobin Other: 0 %
Hgb A: 9.8 % — ABNORMAL LOW (ref 96.8–97.8)
Hgb F Quant: 5.2 % — ABNORMAL HIGH (ref 0.0–2.0)

## 2012-03-17 NOTE — Discharge Summary (Signed)
I fully examined this pt Emergent need full Xcange Tx Mcarthur Rossetti. Tyson Alias, MD, FACP Pgr: [phone] [place] [facility]

## 2012-03-20 LAB — CULTURE, BLOOD (ROUTINE X 2)
Culture  Setup Time: 201306140244
Culture: NO GROWTH
Culture: NO GROWTH

## 2012-03-31 HISTORY — PX: INSERTION OF DIALYSIS CATHETER: SHX1324

## 2012-06-12 ENCOUNTER — Encounter (HOSPITAL_COMMUNITY): Payer: Self-pay | Admitting: Emergency Medicine

## 2012-06-12 ENCOUNTER — Emergency Department (HOSPITAL_COMMUNITY)
Admission: EM | Admit: 2012-06-12 | Discharge: 2012-06-12 | Disposition: A | Discharge status: Home / Self Care | Payer: Federal, State, Local not specified - PPO | Attending: Emergency Medicine | Admitting: Emergency Medicine

## 2012-06-12 DIAGNOSIS — J45909 Unspecified asthma, uncomplicated: Secondary | ICD-10-CM | POA: Insufficient documentation

## 2012-06-12 DIAGNOSIS — Z79899 Other long term (current) drug therapy: Secondary | ICD-10-CM | POA: Insufficient documentation

## 2012-06-12 DIAGNOSIS — D571 Sickle-cell disease without crisis: Secondary | ICD-10-CM

## 2012-06-12 DIAGNOSIS — Z9089 Acquired absence of other organs: Secondary | ICD-10-CM | POA: Insufficient documentation

## 2012-06-12 DIAGNOSIS — Z8673 Personal history of transient ischemic attack (TIA), and cerebral infarction without residual deficits: Secondary | ICD-10-CM | POA: Insufficient documentation

## 2012-06-12 LAB — CBC
Platelets: 306 10*3/uL (ref 150–400)
RBC: 2.47 MIL/uL — ABNORMAL LOW (ref 4.22–5.81)
RDW: 16.5 % — ABNORMAL HIGH (ref 11.5–15.5)
WBC: 19.9 10*3/uL — ABNORMAL HIGH (ref 4.0–10.5)

## 2012-06-12 LAB — RETICULOCYTES
RBC.: 2.47 MIL/uL — ABNORMAL LOW (ref 4.22–5.81)
Retic Count, Absolute: 64.2 10*3/uL (ref 19.0–186.0)

## 2012-06-12 LAB — COMPREHENSIVE METABOLIC PANEL
ALT: 17 U/L (ref 0–53)
AST: 25 U/L (ref 0–37)
Albumin: 3.6 g/dL (ref 3.5–5.2)
CO2: 27 mEq/L (ref 19–32)
Chloride: 100 mEq/L (ref 96–112)
Creatinine, Ser: 4.01 mg/dL — ABNORMAL HIGH (ref 0.50–1.35)
GFR calc non Af Amer: 19 mL/min — ABNORMAL LOW (ref >90)
Sodium: 139 mEq/L (ref 135–145)
Total Bilirubin: 3 mg/dL — ABNORMAL HIGH (ref 0.3–1.2)

## 2012-06-12 MED ORDER — SODIUM CHLORIDE 0.9 % IV BOLUS (SEPSIS)
1000.0000 mL | Freq: Once | INTRAVENOUS | Status: AC
  Administered 2012-06-12: 1000 mL via INTRAVENOUS

## 2012-06-12 MED ORDER — HYDROMORPHONE HCL PF 2 MG/ML IJ SOLN
2.0000 mg | Freq: Once | INTRAMUSCULAR | Status: AC
  Administered 2012-06-12: 2 mg via INTRAVENOUS
  Filled 2012-06-12: qty 1

## 2012-06-12 NOTE — ED Provider Notes (Signed)
26 year old male with sickle cell disease and on hemodialysis had onset of bilateral pelvic pain during dialysis. In the emergency department, he was treated with IV narcotics and fluids and he states that he is currently pain free. On exam, lungs are clear, heart has regular rate and rhythm, abdomen is soft and nontender. He has full range of motion of all joints. Laboratory evaluation was significant only for an elevated bilirubin which is expected in a sickle cell patient. WBC is elevated and this is felt to be due to stress and is less than previous WBCs on record. He appears stable for discharge.  Philip Booze, MD 06/12/12 1753

## 2012-06-12 NOTE — ED Notes (Signed)
Patient completed dialysis today 1.5 hours ago developed sickle cell pain left flank and buttocks pain 8/10 sharp. Ax4. Took own Dilaudid prior arrival.

## 2012-06-12 NOTE — ED Provider Notes (Signed)
History     CSN: [id number]  Arrival date & time 06/12/12  1600   First MD Initiated Contact with Patient 06/12/12 1602      No chief complaint on file.   (Consider location/radiation/quality/duration/timing/severity/associated sxs/prior treatment) Patient is a 26 y.o. male presenting with sickle cell pain. The history is provided by the patient and the EMS personnel. No language interpreter was used.  Sickle Cell Pain Crisis  This is a recurrent problem. The current episode started today. The problem occurs occasionally. The problem has been gradually worsening. The pain is present in the left side and right side. The symptoms are not relieved by one or more prescription drugs. The symptoms are aggravated by activity. Pertinent negatives include no chest pain, no blurred vision, no abdominal pain, no nausea, no vomiting, no headaches, no swollen glands, no back pain, no neck pain, no loss of sensation, no weakness, no cough and no difficulty breathing. There is no swelling present. He has been behaving normally. He has been eating and drinking normally. He sickle cell type is SS. There is a history of acute chest syndrome. There have been frequent pain crises. 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 There is no history of stroke. He has not been treated with chronic transfusion therapy. Sick contacts: at dialysis. Recently, medical care has been given at this facility and at another facility.   26 year old male coming from the dialysis Center postdialysis with sickle cell crisis. Having L side pain and buttocks pain.States that he took 2 mg of Dilaudid 2 PM with no relief. Patient also has a past medical history of hyperkalemia and a hemoglobin of 7 recently with no transfusion. States his pain is 8/10 presently. Patient was admitted in June for "acute chest syndrome" from the floor to ICU after narcotic medication because of lethargy. Transferred to [facility] as well. Patient went into renal  failure and he has been on dialysis for 3 months. He was also transfused 1 unit of packed cells on 6/14 for hemoglobin initially 5.9. No chest pain. 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    Past Medical History  Diagnosis Date  . Sickle cell disease, type SS   . Stroke   . Asthma     Past Surgical History  Procedure Date  . Cholecystectomy     Family History  Problem Relation Age of Onset  . Diabetes Sister     History  Substance Use Topics  . Smoking status: Never Smoker   . Smokeless tobacco: Not on file  . Alcohol Use: 6.0 oz/week    10 Shots of liquor per week     socially      Review of Systems  Constitutional: Negative.   HENT: Negative.  Negative for neck pain.   Eyes: Negative.  Negative for blurred vision.  Respiratory: Negative.  Negative for cough and shortness of breath.   Cardiovascular: Negative for chest pain and palpitations.  Gastrointestinal: Negative.  Negative for nausea, vomiting and abdominal pain.  Musculoskeletal: Negative for back pain and gait problem.       Buttocks and bilateral sides  Neurological: Negative for dizziness, facial asymmetry, speech difficulty, weakness, numbness and headaches.  Psychiatric/Behavioral: The patient is nervous/anxious.   All other systems reviewed and are negative.    Allergies  Codeine  Home Medications   Current Outpatient Rx  Name Route Sig Dispense Refill  . SODIUM BICARBONATE 1000 ML INFUSION  Continue at 75 ml./mr    . FENTANYL CITRATE  0.05 MG/ML IJ SOLN Intravenous Inject 1-4 mLs (50-200 mcg total) into the vein every 2 (two) hours as needed for severe pain (hold for RASS deeper than -2). 2 mL   . FOLIC ACID 1 MG PO TABS Oral Take 1 tablet (1 mg total) by mouth daily. 30 tablet 1  . HEPARIN SODIUM (PORCINE) 1000 UNIT/ML DIALYSIS CRRT 1-6 mLs (1,000-6,000 Units total) by CRRT route as needed (Use to fill CRRT catheter with heparin 1000 units/mL per catheter volume.).    Marland Kitchen  HYDROCODONE-ACETAMINOPHEN 5-325 MG PO TABS Oral Take 1-2 tablets by mouth every 6 (six) hours as needed. For pain 40 tablet 0  . HYDROXYUREA 500 MG PO CAPS Oral Take 2-3 capsules (1,000-1,500 mg total) by mouth daily. Alternates between 2 and 3 tablets each day. 90 capsule 1  . PANTOPRAZOLE SODIUM 40 MG PO TBEC Oral Take 1 tablet (40 mg total) by mouth 2 (two) times daily with a meal.    . IMIPENEM-CILASTATIN IV 250 MG (MINIBAG PLUS) Intravenous Inject 250 mg into the vein every 12 (twelve) hours.      There were no vitals taken for this visit.  Physical Exam  Nursing note and vitals reviewed. Constitutional: He is oriented to person, place, and time. He appears well-developed and well-nourished.  HENT:  Head: Normocephalic.  Eyes: Conjunctivae normal and EOM are normal. Pupils are equal, round, and reactive to light.  Neck: Normal range of motion. Neck supple.  Cardiovascular: Normal rate.   Pulmonary/Chest: Effort normal and breath sounds normal. No respiratory distress.  Abdominal: Soft.  Musculoskeletal: Normal range of motion.       Bilateral sides and buttocks tendeness.  Ambulating without difficulty no chest tenderness.  Neurological: He is alert and oriented to person, place, and time.  Skin: Skin is warm and dry.  Psychiatric: He has a normal mood and affect.    ED Course  Procedures (including critical care time)  Labs Reviewed - No data to display No results found.   No diagnosis found.    MDM   26yo male with sickle cell pain crisis to buttock and L side. No chest pain.  Recent hospitalization for acute chest here and at [facility].  Wbc 19, hgb 7.1 will recheck at dialysis on Saturday (asymptomatic and same as last hgb per patient, Bili 3.0, potasium 2.8.  Will recheck on Saturday with dialysis.  Pain 0/10 after Dilaudid 2mg  IV and NS bolus.  Patient and mom agree with follow up with pcp tomorrow.  Dialysis Saturday.  Return to ER for chest pain, SOB, severe pain or  dizziness/syncope.  Patient and mom agree with plan and ready for discharge.         Remi Haggard, NP 06/13/12 1312

## 2012-06-15 NOTE — ED Provider Notes (Signed)
Medical screening examination/treatment/procedure(s) were conducted as a shared visit with non-physician practitioner(s) and myself.  I personally evaluated the patient during the encounter   Pristine Gladhill, MD 06/15/12 0728 

## 2012-06-17 ENCOUNTER — Encounter (HOSPITAL_COMMUNITY): Payer: Self-pay

## 2012-06-17 DIAGNOSIS — D5700 Hb-SS disease with crisis, unspecified: Secondary | ICD-10-CM | POA: Insufficient documentation

## 2012-06-17 DIAGNOSIS — Z8673 Personal history of transient ischemic attack (TIA), and cerebral infarction without residual deficits: Secondary | ICD-10-CM | POA: Insufficient documentation

## 2012-06-17 DIAGNOSIS — J45909 Unspecified asthma, uncomplicated: Secondary | ICD-10-CM | POA: Insufficient documentation

## 2012-06-17 DIAGNOSIS — Z886 Allergy status to analgesic agent status: Secondary | ICD-10-CM | POA: Insufficient documentation

## 2012-06-17 DIAGNOSIS — N186 End stage renal disease: Secondary | ICD-10-CM | POA: Insufficient documentation

## 2012-06-17 LAB — CBC WITH DIFFERENTIAL/PLATELET
Hemoglobin: 7.8 g/dL — ABNORMAL LOW (ref 13.0–17.0)
Lymphocytes Relative: 10 % — ABNORMAL LOW (ref 12–46)
Lymphs Abs: 1.8 10*3/uL (ref 0.7–4.0)
MCH: 28.6 pg (ref 26.0–34.0)
Monocytes Relative: 6 % (ref 3–12)
Neutro Abs: 14.8 10*3/uL — ABNORMAL HIGH (ref 1.7–7.7)
Neutrophils Relative %: 80 % — ABNORMAL HIGH (ref 43–77)
Platelets: 317 10*3/uL (ref 150–400)
RBC: 2.73 MIL/uL — ABNORMAL LOW (ref 4.22–5.81)
WBC: 18.6 10*3/uL — ABNORMAL HIGH (ref 4.0–10.5)

## 2012-06-17 LAB — COMPREHENSIVE METABOLIC PANEL
ALT: 19 U/L (ref 0–53)
Alkaline Phosphatase: 112 U/L (ref 39–117)
BUN: 13 mg/dL (ref 6–23)
CO2: 28 mEq/L (ref 19–32)
Chloride: 101 mEq/L (ref 96–112)
GFR calc Af Amer: 22 mL/min — ABNORMAL LOW (ref >90)
GFR calc non Af Amer: 19 mL/min — ABNORMAL LOW (ref >90)
Glucose, Bld: 77 mg/dL (ref 70–99)
Potassium: 2.9 mEq/L — ABNORMAL LOW (ref 3.5–5.1)
Sodium: 141 mEq/L (ref 135–145)
Total Bilirubin: 5.5 mg/dL — ABNORMAL HIGH (ref 0.3–1.2)
Total Protein: 8.7 g/dL — ABNORMAL HIGH (ref 6.0–8.3)

## 2012-06-17 NOTE — ED Notes (Signed)
Pt reports [facility] Saturday, pt reports he received a blood transfusion today during his dialysis treatment d/t a low Hgb. Pt reports he began experiencing lower back pain, (L) flank pain, chills, and a headache 45 mins after dialysis and blood transfusion was complete.

## 2012-06-18 ENCOUNTER — Emergency Department (HOSPITAL_COMMUNITY)
Admission: EM | Admit: 2012-06-18 | Discharge: 2012-06-18 | Disposition: A | Discharge status: Home / Self Care | Payer: Federal, State, Local not specified - PPO | Attending: Emergency Medicine | Admitting: Emergency Medicine

## 2012-06-18 DIAGNOSIS — D5700 Hb-SS disease with crisis, unspecified: Secondary | ICD-10-CM

## 2012-06-18 LAB — URINE MICROSCOPIC-ADD ON

## 2012-06-18 LAB — URINALYSIS, ROUTINE W REFLEX MICROSCOPIC
Nitrite: NEGATIVE
Specific Gravity, Urine: 1.029 (ref 1.005–1.030)
Urobilinogen, UA: 1 mg/dL (ref 0.0–1.0)
pH: 6.5 (ref 5.0–8.0)

## 2012-06-18 LAB — RETICULOCYTES: Retic Ct Pct: 2.4 % (ref 0.4–3.1)

## 2012-06-18 MED ORDER — HYDROMORPHONE HCL PF 2 MG/ML IJ SOLN
2.0000 mg | Freq: Once | INTRAMUSCULAR | Status: AC
  Administered 2012-06-18: 2 mg via INTRAVENOUS
  Filled 2012-06-18: qty 1

## 2012-06-18 MED ORDER — CEPHALEXIN 500 MG PO CAPS: 500.0000 mg | ORAL_CAPSULE | Freq: Four times a day (QID) | ORAL | Status: DC

## 2012-06-18 MED ORDER — KETOROLAC TROMETHAMINE 30 MG/ML IJ SOLN
30.0000 mg | Freq: Once | INTRAMUSCULAR | Status: AC
  Administered 2012-06-18: 30 mg via INTRAVENOUS
  Filled 2012-06-18: qty 1

## 2012-06-18 MED ORDER — SODIUM CHLORIDE 0.9 % IV BOLUS (SEPSIS)
1000.0000 mL | Freq: Once | INTRAVENOUS | Status: AC
  Administered 2012-06-18: 1000 mL via INTRAVENOUS

## 2012-06-18 NOTE — ED Notes (Signed)
The patient is AOx4 and comfortable with his discharge instructions.  The patient has a ride home. 

## 2012-06-18 NOTE — ED Provider Notes (Signed)
Medical screening examination/treatment/procedure(s) were performed by non-physician practitioner and as supervising physician I was immediately available for consultation/collaboration.   Marwan T Powers, MD 06/18/12 0752 

## 2012-06-18 NOTE — ED Provider Notes (Signed)
History     CSN: [id number]  Arrival date & time 06/17/12  Philip Rios   First MD Initiated Contact with Patient 06/18/12 0145      Chief Complaint  Patient presents with  . Allergic Reaction  . Sickle Cell Pain Crisis    (Consider location/radiation/quality/duration/timing/severity/associated sxs/prior treatment) HPI History provided by pt.   Pt presents w/ concern for allergic reaction to the blood transfusion he received at dialysis yesterday.  Developed headache and pain in bilateral flank today, but denies rash, tongue/lip edema and dyspnea.  Since he was waiting in the waiting room for so long, he went into a sickle cell crisis.  Pain located in buttocks and bilateral flank and is typical for him.  Denies urinary sx, N/V/D.  Headache located at bridge of nose.  Has had similar in the past and no associated neuro sx.   Denies CP/SOB.  Denies trauma.   Past Medical History  Diagnosis Date  . Sickle cell disease, type SS   . Stroke   . Asthma     Past Surgical History  Procedure Date  . Cholecystectomy     Family History  Problem Relation Age of Onset  . Diabetes Sister     History  Substance Use Topics  . Smoking status: Never Smoker   . Smokeless tobacco: Not on file  . Alcohol Use: 6.0 oz/week    10 Shots of liquor per week     socially      Review of Systems  All other systems reviewed and are negative.    Allergies  Codeine  Home Medications   Current Outpatient Rx  Name Route Sig Dispense Refill  . FOLIC ACID 1 MG PO TABS Oral Take 1 tablet (1 mg total) by mouth daily. 30 tablet 1  . HYDRALAZINE HCL 25 MG PO TABS Oral Take 25 mg by mouth 3 (three) times daily.    Marland Kitchen HYDROMORPHONE HCL 4 MG PO TABS Oral Take 4 mg by mouth 3 (three) times daily as needed. For pain    . HYDROXYUREA 500 MG PO CAPS Oral Take 2-3 capsules (1,000-1,500 mg total) by mouth daily. Alternates between 2 and 3 tablets each day. 90 capsule 1  . PROMETHAZINE HCL 12.5 MG PO TABS Oral  Take 12.5 mg by mouth every 6 (six) hours as needed. For nausea    . SEVELAMER CARBONATE 800 MG PO TABS Oral Take 800 mg by mouth 3 (three) times daily with meals.    . TORSEMIDE 20 MG PO TABS Oral Take 60 mg by mouth daily.      BP 111/63  Pulse 91  Temp 98.8 F (37.1 C) (Oral)  Resp 18  SpO2 100%  Physical Exam  Nursing note and vitals reviewed. Constitutional: He is oriented to person, place, and time. He appears well-developed and well-nourished. No distress.  HENT:  Head: Normocephalic and atraumatic.       No tongue/lip edema  Eyes:       Scleral icterus  Neck: Normal range of motion.  Cardiovascular: Normal rate and regular rhythm.   Pulmonary/Chest: Effort normal and breath sounds normal. No stridor. No respiratory distress.  Abdominal: Soft. Bowel sounds are normal. He exhibits no distension. There is no tenderness.  Genitourinary:       No CVA tenderness  Musculoskeletal: Normal range of motion.  Neurological: He is alert and oriented to person, place, and time.  Skin: Skin is warm and dry. No rash noted.  Psychiatric: He has a  normal mood and affect. His behavior is normal.    ED Course  Procedures (including critical care time)  Labs Reviewed  URINALYSIS, ROUTINE W REFLEX MICROSCOPIC - Abnormal; Notable for the following:    Color, Urine AMBER (*)  BIOCHEMICALS MAY BE AFFECTED BY COLOR   APPearance CLOUDY (*)     Hgb urine dipstick LARGE (*)     Bilirubin Urine MODERATE (*)     Ketones, ur 15 (*)     Protein, ur >300 (*)     Leukocytes, UA SMALL (*)     All other components within normal limits  CBC WITH DIFFERENTIAL - Abnormal; Notable for the following:    WBC 18.6 (*)     RBC 2.73 (*)     Hemoglobin 7.8 (*)     HCT 23.2 (*)     RDW 16.2 (*)     Neutrophils Relative 80 (*)     Neutro Abs 14.8 (*)     Lymphocytes Relative 10 (*)     Monocytes Absolute 1.1 (*)     Eosinophils Absolute 0.8 (*)     All other components within normal limits    COMPREHENSIVE METABOLIC PANEL - Abnormal; Notable for the following:    Potassium 2.9 (*)     Creatinine, Ser 4.02 (*)     Total Protein 8.7 (*)     Total Bilirubin 5.5 (*)     GFR calc non Af Amer 19 (*)     GFR calc Af Amer 22 (*)     All other components within normal limits  RETICULOCYTES - Abnormal; Notable for the following:    RBC. 2.33 (*)     All other components within normal limits  URINE MICROSCOPIC-ADD ON - Abnormal; Notable for the following:    Casts HYALINE CASTS (*)  GRANULAR CAST   All other components within normal limits   No results found.   1. Sickle cell crisis       MDM  26yo M presents w/ sickle cell crisis.  Pain located in bilateral flank and buttocks as well as forehead.  He thought he was having a reaction to blood transfusion he received in dialysis yesterday but pain more consistent w/ crisis since arriving in ED.   Labs sig for hypokalemia, elevated but stable creatinine, leukocytosis and stable anemia.  Retic count pending to r/o aplastic anemia.  Pt has received IV NS bolus, 2mg  IV dilaudid and 30mg  IV toradol.  Will reassess shortly.    Pt reports that pain has completely resolved.  Will d/c home when retic count results.  3:35 AM           Philip Miu, PA 06/18/12 [phone]

## 2012-06-26 ENCOUNTER — Encounter (HOSPITAL_COMMUNITY): Payer: Self-pay | Admitting: Emergency Medicine

## 2012-06-26 ENCOUNTER — Emergency Department (HOSPITAL_COMMUNITY)
Admission: EM | Admit: 2012-06-26 | Discharge: 2012-06-26 | Disposition: A | Discharge status: Home / Self Care | Payer: Federal, State, Local not specified - PPO | Attending: Emergency Medicine | Admitting: Emergency Medicine

## 2012-06-26 DIAGNOSIS — J45909 Unspecified asthma, uncomplicated: Secondary | ICD-10-CM | POA: Insufficient documentation

## 2012-06-26 DIAGNOSIS — E876 Hypokalemia: Secondary | ICD-10-CM | POA: Insufficient documentation

## 2012-06-26 DIAGNOSIS — R748 Abnormal levels of other serum enzymes: Secondary | ICD-10-CM | POA: Insufficient documentation

## 2012-06-26 DIAGNOSIS — Z8673 Personal history of transient ischemic attack (TIA), and cerebral infarction without residual deficits: Secondary | ICD-10-CM | POA: Insufficient documentation

## 2012-06-26 DIAGNOSIS — N186 End stage renal disease: Secondary | ICD-10-CM | POA: Insufficient documentation

## 2012-06-26 DIAGNOSIS — Z886 Allergy status to analgesic agent status: Secondary | ICD-10-CM | POA: Insufficient documentation

## 2012-06-26 DIAGNOSIS — D5700 Hb-SS disease with crisis, unspecified: Secondary | ICD-10-CM

## 2012-06-26 HISTORY — DX: Disorder of kidney and ureter, unspecified: N28.9

## 2012-06-26 LAB — CBC WITH DIFFERENTIAL/PLATELET
Basophils Relative: 0 % (ref 0–1)
Eosinophils Absolute: 1.2 10*3/uL — ABNORMAL HIGH (ref 0.0–0.7)
Eosinophils Relative: 6 % — ABNORMAL HIGH (ref 0–5)
Hemoglobin: 8.6 g/dL — ABNORMAL LOW (ref 13.0–17.0)
Lymphs Abs: 1.7 10*3/uL (ref 0.7–4.0)
MCH: 28 pg (ref 26.0–34.0)
MCHC: 33.5 g/dL (ref 30.0–36.0)
MCV: 83.7 fL (ref 78.0–100.0)
Monocytes Absolute: 1.3 10*3/uL — ABNORMAL HIGH (ref 0.1–1.0)
Monocytes Relative: 7 % (ref 3–12)
Neutrophils Relative %: 77 % (ref 43–77)
RBC: 3.07 MIL/uL — ABNORMAL LOW (ref 4.22–5.81)

## 2012-06-26 LAB — RETICULOCYTES
RBC.: 3.09 MIL/uL — ABNORMAL LOW (ref 4.22–5.81)
Retic Count, Absolute: 58.7 10*3/uL (ref 19.0–186.0)
Retic Ct Pct: 1.9 % (ref 0.4–3.1)

## 2012-06-26 LAB — COMPREHENSIVE METABOLIC PANEL
Albumin: 3.7 g/dL (ref 3.5–5.2)
Alkaline Phosphatase: 118 U/L — ABNORMAL HIGH (ref 39–117)
BUN: 15 mg/dL (ref 6–23)
Calcium: 9.5 mg/dL (ref 8.4–10.5)
Creatinine, Ser: 4.16 mg/dL — ABNORMAL HIGH (ref 0.50–1.35)
GFR calc Af Amer: 22 mL/min — ABNORMAL LOW (ref >90)
Glucose, Bld: 91 mg/dL (ref 70–99)
Potassium: 2.9 mEq/L — ABNORMAL LOW (ref 3.5–5.1)
Total Protein: 8.9 g/dL — ABNORMAL HIGH (ref 6.0–8.3)

## 2012-06-26 LAB — LIPASE, BLOOD: Lipase: 420 U/L — ABNORMAL HIGH (ref 11–59)

## 2012-06-26 MED ORDER — ONDANSETRON HCL 4 MG/2ML IJ SOLN
4.0000 mg | Freq: Once | INTRAMUSCULAR | Status: AC
  Administered 2012-06-26: 4 mg via INTRAVENOUS
  Filled 2012-06-26: qty 2

## 2012-06-26 MED ORDER — HYDROMORPHONE HCL PF 2 MG/ML IJ SOLN
2.0000 mg | Freq: Once | INTRAMUSCULAR | Status: AC
  Administered 2012-06-26: 2 mg via INTRAVENOUS
  Filled 2012-06-26: qty 1

## 2012-06-26 MED ORDER — DIPHENHYDRAMINE HCL 50 MG/ML IJ SOLN
50.0000 mg | Freq: Once | INTRAMUSCULAR | Status: AC
  Administered 2012-06-26: 50 mg via INTRAVENOUS
  Filled 2012-06-26: qty 1

## 2012-06-26 MED ORDER — SODIUM CHLORIDE 0.9 % IV SOLN
INTRAVENOUS | Status: DC
  Administered 2012-06-26: 19:00:00 via INTRAVENOUS

## 2012-06-26 NOTE — ED Notes (Signed)
Patient had dialysis today and after he finished he developed back pain and bilateral flank pain.  The pain came on suddenly and is intermittent.  Patient doesn't think it is a crisis because usually the pain is constant with a crisis.  Denies nausea, vomiting, chills, or fever.

## 2012-06-26 NOTE — ED Notes (Signed)
Notified pt that urine sample is needed. Pt understood and will notify me if/when able to produce.

## 2012-06-26 NOTE — ED Notes (Signed)
Bed:WA08<BR> Expected date:<BR> Expected time:<BR> Means of arrival:<BR> Comments:<BR>

## 2012-06-26 NOTE — ED Notes (Signed)
Pt presenting to ed with c/o bilateral flank pain pt states he has been doing dialysis x 3 months and is noticing that the pain is getting worse each time pt states he is also sickle cell and this could be a crisis but it's hard to tell. Pt states he has dialysis Tuesday, Thursday, Saturday. Pt denies nausea, vomiting and no chest pain  Or shortness of breath at this time

## 2012-06-26 NOTE — ED Notes (Signed)
Pt sipping fluids without difficulty.

## 2012-06-26 NOTE — ED Notes (Signed)
Per Dr. Lynelle Doctor - will not use cath to obtain sample.  Will not require sample if pt is unable to produce on their own

## 2012-06-26 NOTE — ED Provider Notes (Signed)
History     CSN: [id number]  Arrival date & time 06/26/12  1704   First MD Initiated Contact with Patient 06/26/12 1821      Chief Complaint  Patient presents with  . Flank Pain  . Vascular Access Problem    (Consider location/radiation/quality/duration/timing/severity/associated sxs/prior treatment) HPI  Patient states he has been on dialysis about 3 months. He relates the last 3-4 episodes after he has dialysis he gets lower back pain that persists for hours and sometimes a couple days. He states the pain is aching in nature. He states he had dialysis today and the pain started about 3:30 PM. He states he has had sickle cell pain in the same area, the last time he had a sickle cell crisis was 2 weeks ago he was treated at [facility] ED. He denies nausea, vomiting, diarrhea, fever, chest pain, shortness of breath or cough. He states he still makes some urine output about every 3 hours. He states he has gotten 2 units of blood in the past week during dialysis for a Hb of 6. Patient states his dry weight is 125 and they have not changed his dry weight.` Patient gets dialysis on Tuesday, Thursday (today) and Saturday. He goes to [facility] in  [place].  PCP Dr Concepcion Elk Nephrologist at [facility]  Past Medical History  Diagnosis Date  . Sickle cell disease, type SS   . Stroke   . Asthma   . Renal disorder     Past Surgical History  Procedure Date  . Cholecystectomy     Family History  Problem Relation Age of Onset  . Diabetes Sister     History  Substance Use Topics  . Smoking status: Never Smoker   . Smokeless tobacco: Not on file  . Alcohol Use: No     socially   Was working until he started dialysis 3 months ago   Review of Systems  All other systems reviewed and are negative.    Allergies  Codeine  Home Medications   Current Outpatient Rx  Name Route Sig Dispense Refill  . CEPHALEXIN 500 MG PO CAPS Oral Take 1 capsule (500 mg total) by mouth 4 (four)  times daily. 28 capsule 0  . HYDROMORPHONE HCL 4 MG PO TABS Oral Take 4 mg by mouth 3 (three) times daily as needed. For pain    . HYDROXYUREA 500 MG PO CAPS Oral Take 1,000-1,500 mg by mouth daily. Alternates between 2 and 3 tablets each day.      BP 109/63  Pulse 79  Temp 98.1 F (36.7 C) (Oral)  Resp 16  SpO2 100%  Vital signs normal    Physical Exam  Nursing note and vitals reviewed. Constitutional: He is oriented to person, place, and time. He appears well-developed and well-nourished.  Non-toxic appearance. He does not appear ill. No distress.  HENT:  Head: Normocephalic and atraumatic.  Right Ear: External ear normal.  Left Ear: External ear normal.  Nose: Nose normal. No mucosal edema or rhinorrhea.  Mouth/Throat: Oropharynx is clear and moist and mucous membranes are normal. No dental abscesses or uvula swelling.  Eyes: Conjunctivae normal and EOM are normal. Pupils are equal, round, and reactive to light. Scleral icterus is present.  Neck: Normal range of motion and full passive range of motion without pain. Neck supple.  Cardiovascular: Normal rate, regular rhythm and normal heart sounds.  Exam reveals no gallop and no friction rub.   No murmur heard. Pulmonary/Chest: Effort normal  and breath sounds normal. No respiratory distress. He has no wheezes. He has no rhonchi. He has no rales. He exhibits no tenderness and no crepitus.       Patient has a Vas-Cath in his right chest  Abdominal: Soft. Normal appearance and bowel sounds are normal. He exhibits no distension. There is no tenderness. There is no rebound and no guarding.  Musculoskeletal: Normal range of motion. He exhibits no edema and no tenderness.       Moves all extremities well.  The patient does not have pain in his flank area, he indicates his pain is more in the sacral region bilaterally.  Neurological: He is alert and oriented to person, place, and time. He has normal strength. No cranial nerve deficit.    Skin: Skin is warm, dry and intact. No rash noted. No erythema. No pallor.  Psychiatric: He has a normal mood and affect. His speech is normal and behavior is normal. His mood appears not anxious.    ED Course  Procedures (including critical care time)   Medications  0.9 %  sodium chloride infusion (  Intravenous New Bag/Given 06/26/12 1903)  HYDROmorphone (DILAUDID) injection 2 mg (2 mg Intravenous Given 06/26/12 1904)  ondansetron (ZOFRAN) injection 4 mg (4 mg Intravenous Given 06/26/12 1904)  diphenhydrAMINE (BENADRYL) injection 50 mg (50 mg Intravenous Given 06/26/12 1904)   Recheck at 2045 patient feeling better. He feels like he may be able to go home. He is wanting to try to drink some fluids to see how he feels.  22:00 Pt has drank fluids and states he is pain free, ready to go home.   Results for orders placed during the hospital encounter of 06/26/12  CBC WITH DIFFERENTIAL      Component Value Range   WBC 18.3 (*) 4.0 - 10.5 K/uL   RBC 3.07 (*) 4.22 - 5.81 MIL/uL   Hemoglobin 8.6 (*) 13.0 - 17.0 g/dL   HCT 45.4 (*) 09.8 - 11.9 %   MCV 83.7  78.0 - 100.0 fL   MCH 28.0  26.0 - 34.0 pg   MCHC 33.5  30.0 - 36.0 g/dL   RDW 14.7 (*) 82.9 - 56.2 %   Platelets 301  150 - 400 K/uL   Neutrophils Relative 77  43 - 77 %   Neutro Abs 14.1 (*) 1.7 - 7.7 K/uL   Lymphocytes Relative 9 (*) 12 - 46 %   Lymphs Abs 1.7  0.7 - 4.0 K/uL   Monocytes Relative 7  3 - 12 %   Monocytes Absolute 1.3 (*) 0.1 - 1.0 K/uL   Eosinophils Relative 6 (*) 0 - 5 %   Eosinophils Absolute 1.2 (*) 0.0 - 0.7 K/uL   Basophils Relative 0  0 - 1 %   Basophils Absolute 0.1  0.0 - 0.1 K/uL  COMPREHENSIVE METABOLIC PANEL      Component Value Range   Sodium 141  135 - 145 mEq/L   Potassium 2.9 (*) 3.5 - 5.1 mEq/L   Chloride 100  96 - 112 mEq/L   CO2 29  19 - 32 mEq/L   Glucose, Bld 91  70 - 99 mg/dL   BUN 15  6 - 23 mg/dL   Creatinine, Ser 1.30 (*) 0.50 - 1.35 mg/dL   Calcium 9.5  8.4 - 86.5 mg/dL   Total  Protein 8.9 (*) 6.0 - 8.3 g/dL   Albumin 3.7  3.5 - 5.2 g/dL   AST 41 (*) 0 - 37  U/L   ALT 20  0 - 53 U/L   Alkaline Phosphatase 118 (*) 39 - 117 U/L   Total Bilirubin 3.0 (*) 0.3 - 1.2 mg/dL   GFR calc non Af Amer 19 (*) >90 mL/min   GFR calc Af Amer 22 (*) >90 mL/min  LIPASE, BLOOD      Component Value Range   Lipase 420 (*) 11 - 59 U/L  RETICULOCYTES      Component Value Range   Retic Ct Pct 1.9  0.4 - 3.1 %   RBC. 3.09 (*) 4.22 - 5.81 MIL/uL   Retic Count, Manual 58.7  19.0 - 186.0 K/uL    Laboratory interpretation all normal except hypokalemia, elevated lipase possibly from his renal disease, leukocytosis, stable anemia, renal failure     1. Sickle cell crisis     Plan discharge  Devoria Albe, MD, FACEP   MDM          Ward Givens, MD 06/26/12 2209

## 2012-06-27 ENCOUNTER — Other Ambulatory Visit: Payer: Self-pay

## 2012-07-24 ENCOUNTER — Encounter (HOSPITAL_COMMUNITY): Payer: Self-pay

## 2012-07-24 ENCOUNTER — Emergency Department (HOSPITAL_COMMUNITY)
Admission: EM | Admit: 2012-07-24 | Discharge: 2012-07-24 | Disposition: A | Discharge status: Home / Self Care | Payer: Federal, State, Local not specified - PPO | Attending: Emergency Medicine | Admitting: Emergency Medicine

## 2012-07-24 ENCOUNTER — Emergency Department (HOSPITAL_COMMUNITY): Payer: Federal, State, Local not specified - PPO

## 2012-07-24 DIAGNOSIS — D5700 Hb-SS disease with crisis, unspecified: Secondary | ICD-10-CM | POA: Insufficient documentation

## 2012-07-24 DIAGNOSIS — K5900 Constipation, unspecified: Secondary | ICD-10-CM | POA: Insufficient documentation

## 2012-07-24 DIAGNOSIS — Z8679 Personal history of other diseases of the circulatory system: Secondary | ICD-10-CM | POA: Insufficient documentation

## 2012-07-24 DIAGNOSIS — J45909 Unspecified asthma, uncomplicated: Secondary | ICD-10-CM | POA: Insufficient documentation

## 2012-07-24 DIAGNOSIS — Z992 Dependence on renal dialysis: Secondary | ICD-10-CM | POA: Insufficient documentation

## 2012-07-24 DIAGNOSIS — Z87448 Personal history of other diseases of urinary system: Secondary | ICD-10-CM | POA: Insufficient documentation

## 2012-07-24 LAB — CBC WITH DIFFERENTIAL/PLATELET
Basophils Absolute: 0 10*3/uL (ref 0.0–0.1)
Eosinophils Absolute: 1 10*3/uL — ABNORMAL HIGH (ref 0.0–0.7)
HCT: 23.9 % — ABNORMAL LOW (ref 39.0–52.0)
Lymphocytes Relative: 10 % — ABNORMAL LOW (ref 12–46)
MCHC: 33.1 g/dL (ref 30.0–36.0)
Monocytes Relative: 6 % (ref 3–12)
Neutro Abs: 15.2 10*3/uL — ABNORMAL HIGH (ref 1.7–7.7)
Platelets: 248 10*3/uL (ref 150–400)
RDW: 17.5 % — ABNORMAL HIGH (ref 11.5–15.5)
WBC: 19.3 10*3/uL — ABNORMAL HIGH (ref 4.0–10.5)

## 2012-07-24 LAB — BASIC METABOLIC PANEL
BUN: 12 mg/dL (ref 6–23)
Calcium: 9.7 mg/dL (ref 8.4–10.5)
GFR calc Af Amer: 27 mL/min — ABNORMAL LOW (ref >90)
GFR calc non Af Amer: 24 mL/min — ABNORMAL LOW (ref >90)
Glucose, Bld: 74 mg/dL (ref 70–99)
Sodium: 136 mEq/L (ref 135–145)

## 2012-07-24 MED ORDER — ONDANSETRON HCL 4 MG/2ML IJ SOLN
4.0000 mg | Freq: Once | INTRAMUSCULAR | Status: AC
  Administered 2012-07-24: 4 mg via INTRAVENOUS
  Filled 2012-07-24: qty 2

## 2012-07-24 MED ORDER — HYDROMORPHONE HCL PF 2 MG/ML IJ SOLN
2.0000 mg | Freq: Once | INTRAMUSCULAR | Status: AC
  Administered 2012-07-24: 2 mg via INTRAVENOUS
  Filled 2012-07-24: qty 1

## 2012-07-24 MED ORDER — BISACODYL 5 MG PO TBEC: 5.0000 mg | DELAYED_RELEASE_TABLET | Freq: Two times a day (BID) | ORAL | Status: DC

## 2012-07-24 NOTE — ED Notes (Signed)
Patient c/o pelvic pain and reports that it began after he finished his dialysis which lasted 4 hours.

## 2012-07-24 NOTE — ED Provider Notes (Signed)
History     CSN: [id number]  Arrival date & time 07/24/12  1633   First MD Initiated Contact with Patient 07/24/12 1705      Chief Complaint  Patient presents with  . Abdominal Pain    (Consider location/radiation/quality/duration/timing/severity/associated sxs/prior treatment) HPI  Patient comes to the ER after dialysis for pelvic pain, flank pain and back pain. He is a sickle cell patient and says that this feels like the same. He admits to feeling gassy as well passing a lot of stool today. He denies nausea, vomiting, diarrhea, fever, chest pain, shortness of breath or cough. He states he still makes some urine output about every 3 hours. He states he has gotten 2 units of blood in the past week during dialysis for a Hb of 6. Patient gets dialysis on Tuesday, Thursday (today) and Saturday. He goes to [facility] in [place]. He is in nad and vss.      Past Medical History  Diagnosis Date  . Sickle cell disease, type SS   . Stroke   . Asthma   . Renal disorder     Past Surgical History  Procedure Date  . Cholecystectomy   . Renal biopsy     Family History  Problem Relation Age of Onset  . Diabetes Sister     History  Substance Use Topics  . Smoking status: Never Smoker   . Smokeless tobacco: Never Used  . Alcohol Use: No      Review of Systems  Review of Systems  Gen: no weight loss, fevers, chills, night sweats  Eyes: no discharge or drainage, no occular pain or visual changes  Nose: no epistaxis or rhinorrhea  Mouth: no dental pain, no sore throat  Neck: no neck pain  Lungs:No wheezing, coughing or hemoptysis CV: no chest pain, palpitations, dependent edema or orthopnea  Abd: + abdominal pain/sacral pain, no nausea, vomiting  GU: no dysuria or gross hematuria  MSK:  No abnormalities  Neuro: no headache, no focal neurologic deficits  Skin: no abnormalities Psyche: negative.   Allergies  Codeine  Home Medications   Current Outpatient Rx   Name Route Sig Dispense Refill  . HYDROMORPHONE HCL 4 MG PO TABS Oral Take 4 mg by mouth 3 (three) times daily as needed. For pain    . HYDROXYUREA 500 MG PO CAPS Oral Take 1,000-1,500 mg by mouth daily. Alternates between 2 and 3 tablets each day.    Marland Kitchen BISACODYL 5 MG PO TBEC Oral Take 1 tablet (5 mg total) by mouth 2 (two) times daily. 14 tablet 0    BP 105/66  Pulse 85  Temp 98 F (36.7 C) (Oral)  Resp 18  Wt 127 lb (57.607 kg)  SpO2 100%  Physical Exam  Physical Exam  Nursing note and vitals reviewed.  Constitutional: He is oriented to person, place, and time. He appears well-developed and well-nourished. Non-toxic appearance. He does not appear ill. No distress.  HENT:  Head: Normocephalic and atraumatic.  Right Ear: External ear normal.  Left Ear: External ear normal.  Nose: Nose normal. No mucosal edema or rhinorrhea.  Mouth/Throat: Oropharynx is clear and moist and mucous membranes are normal. No dental abscesses or uvula swelling.  Eyes: Conjunctivae normal and EOM are normal. Pupils are equal, round, and reactive to light. Scleral icterus is present.  Neck: Normal range of motion and full passive range of motion without pain. Neck supple.  Cardiovascular: Normal rate, regular rhythm and normal heart sounds. Exam  reveals no gallop and no friction rub.  No murmur heard.  Pulmonary/Chest: Effort normal and breath sounds normal. No respiratory distress. He has no wheezes. He has no rhonchi. He has no rales. He exhibits no tenderness and no crepitus.  Patient has a Vas-Cath in his right chest  Abdominal: Soft. Normal appearance and bowel sounds are normal. He exhibits no distension. There is no tenderness. There is no rebound and no guarding.  Musculoskeletal: Normal range of motion. He exhibits no edema and no tenderness.  Moves all extremities well.  Neurological: He is alert and oriented to person, place, and time. He has normal strength. No cranial nerve deficit.  Skin:  Skin is warm, dry and intact. No rash noted. No erythema. No pallor.  Psychiatric: He has a normal mood and affect. His speech is normal and behavior is normal. His mood appears not anxious.      ED Course  Procedures (including critical care time)  Labs Reviewed  CBC WITH DIFFERENTIAL - Abnormal; Notable for the following:    WBC 19.3 (*)     RBC 2.84 (*)     Hemoglobin 7.9 (*)     HCT 23.9 (*)     RDW 17.5 (*)     Neutrophils Relative 79 (*)     Lymphocytes Relative 10 (*)     Neutro Abs 15.2 (*)     Monocytes Absolute 1.2 (*)     Eosinophils Absolute 1.0 (*)     All other components within normal limits  BASIC METABOLIC PANEL - Abnormal; Notable for the following:    Potassium 3.0 (*)     Creatinine, Ser 3.42 (*)     GFR calc non Af Amer 24 (*)     GFR calc Af Amer 27 (*)     All other components within normal limits  RETICULOCYTES - Abnormal; Notable for the following:    RBC. 2.84 (*)     All other components within normal limits  URINALYSIS, ROUTINE W REFLEX MICROSCOPIC   Dg Abd Acute W/chest  07/24/2012  *RADIOLOGY REPORT*  Clinical Data: Left lower abdominal pain  ACUTE ABDOMEN SERIES (ABDOMEN 2 VIEW & CHEST 1 VIEW)  Comparison: Chest radiograph dated 03/14/2012  Findings: Lungs are clear. No pleural effusion or pneumothorax.  Cardiomediastinal silhouette is within normal limits.  Right IJ venous catheter terminating at the cavoatrial junction.  Nonobstructive bowel gas pattern.  Moderate stool in the colon.  No evidence of free air under the diaphragm on the upright view.  Cholecystectomy clips.  Visualized osseous structures are within normal limits.  IMPRESSION: No evidence of acute cardiopulmonary disease.  No evidence of small bowel obstruction or free air.  Moderate stool in the colon.   Original Report Authenticated By: Charline Bills, M.D.      1. Sickle cell crisis   2. Constipation       MDM  Pt symptoms are like his sickle cell crisis. 2mg  IV  dilaudid given and some Zofran. Waiting for acute abd series.   Pts reticulocyte count is normal. He says that he is no longer having pain and feels much better. He feels that he can go home. He has Dilaudid at home still, Dr. Concepcion Elk is his PCP.  Acute abdominal series was normal aside from moderate stool in colon and pt had informed me that he has been constipated. Will give Rx for gentle laxative.   Pt to follow-up with Dr. Concepcion Elk. H  Pt has been advised of the  symptoms that warrant their return to the ED. Patient has voiced understanding and has agreed to follow-up with the PCP or specialist.       Dorthula Matas, PA 07/24/12 1905

## 2012-07-28 NOTE — ED Provider Notes (Signed)
Medical screening examination/treatment/procedure(s) were performed by non-physician practitioner and as supervising physician I was immediately available for consultation/collaboration.  Laroy Mustard R. Anil Havard, MD 07/28/12 0956 

## 2012-08-14 ENCOUNTER — Encounter (HOSPITAL_COMMUNITY): Payer: Self-pay | Admitting: *Deleted

## 2012-08-14 ENCOUNTER — Emergency Department (HOSPITAL_COMMUNITY)
Admission: EM | Admit: 2012-08-14 | Discharge: 2012-08-14 | Disposition: A | Discharge status: Home / Self Care | Payer: Federal, State, Local not specified - PPO | Attending: Emergency Medicine | Admitting: Emergency Medicine

## 2012-08-14 DIAGNOSIS — R252 Cramp and spasm: Secondary | ICD-10-CM | POA: Insufficient documentation

## 2012-08-14 DIAGNOSIS — Z992 Dependence on renal dialysis: Secondary | ICD-10-CM | POA: Insufficient documentation

## 2012-08-14 DIAGNOSIS — J45909 Unspecified asthma, uncomplicated: Secondary | ICD-10-CM | POA: Insufficient documentation

## 2012-08-14 DIAGNOSIS — N186 End stage renal disease: Secondary | ICD-10-CM | POA: Insufficient documentation

## 2012-08-14 DIAGNOSIS — Z8673 Personal history of transient ischemic attack (TIA), and cerebral infarction without residual deficits: Secondary | ICD-10-CM | POA: Insufficient documentation

## 2012-08-14 DIAGNOSIS — M545 Low back pain, unspecified: Secondary | ICD-10-CM | POA: Insufficient documentation

## 2012-08-14 DIAGNOSIS — D5700 Hb-SS disease with crisis, unspecified: Secondary | ICD-10-CM | POA: Insufficient documentation

## 2012-08-14 LAB — CBC WITH DIFFERENTIAL/PLATELET
Basophils Absolute: 0 10*3/uL (ref 0.0–0.1)
Basophils Relative: 0 % (ref 0–1)
Eosinophils Absolute: 1 10*3/uL — ABNORMAL HIGH (ref 0.0–0.7)
HCT: 21.9 % — ABNORMAL LOW (ref 39.0–52.0)
Lymphocytes Relative: 9 % — ABNORMAL LOW (ref 12–46)
Lymphs Abs: 2.3 10*3/uL (ref 0.7–4.0)
MCH: 28.5 pg (ref 26.0–34.0)
MCHC: 33.3 g/dL (ref 30.0–36.0)
MCV: 85.5 fL (ref 78.0–100.0)
Monocytes Absolute: 2.1 10*3/uL — ABNORMAL HIGH (ref 0.1–1.0)
Neutro Abs: 20.6 10*3/uL — ABNORMAL HIGH (ref 1.7–7.7)
RDW: 17.6 % — ABNORMAL HIGH (ref 11.5–15.5)

## 2012-08-14 LAB — COMPREHENSIVE METABOLIC PANEL
Albumin: 3.6 g/dL (ref 3.5–5.2)
BUN: 20 mg/dL (ref 6–23)
Calcium: 9.5 mg/dL (ref 8.4–10.5)
Creatinine, Ser: 3.54 mg/dL — ABNORMAL HIGH (ref 0.50–1.35)
GFR calc Af Amer: 26 mL/min — ABNORMAL LOW (ref >90)
Glucose, Bld: 74 mg/dL (ref 70–99)
Total Protein: 8.1 g/dL (ref 6.0–8.3)

## 2012-08-14 LAB — RETICULOCYTES
RBC.: 2.56 MIL/uL — ABNORMAL LOW (ref 4.22–5.81)
Retic Ct Pct: 3.8 % — ABNORMAL HIGH (ref 0.4–3.1)

## 2012-08-14 MED ORDER — HYDROMORPHONE HCL PF 1 MG/ML IJ SOLN
1.0000 mg | INTRAMUSCULAR | Status: DC | PRN
  Administered 2012-08-14: 2 mg via INTRAVENOUS
  Filled 2012-08-14: qty 2

## 2012-08-14 MED ORDER — DIPHENHYDRAMINE HCL 50 MG/ML IJ SOLN
12.5000 mg | Freq: Four times a day (QID) | INTRAMUSCULAR | Status: DC | PRN
  Administered 2012-08-14: 12.5 mg via INTRAVENOUS
  Filled 2012-08-14: qty 1

## 2012-08-14 MED ORDER — ONDANSETRON HCL 4 MG/2ML IJ SOLN
4.0000 mg | Freq: Four times a day (QID) | INTRAMUSCULAR | Status: DC | PRN
  Administered 2012-08-14: 4 mg via INTRAVENOUS
  Filled 2012-08-14: qty 2

## 2012-08-14 MED ORDER — SODIUM CHLORIDE 0.9 % IV SOLN: 1000.0000 mL | INTRAVENOUS | Status: DC

## 2012-08-14 MED ORDER — SODIUM CHLORIDE 0.9 % IV SOLN
1000.0000 mL | Freq: Once | INTRAVENOUS | Status: AC
  Administered 2012-08-14: 1000 mL via INTRAVENOUS

## 2012-08-14 NOTE — ED Provider Notes (Signed)
History    CSN: [id number] Arrival date & time 08/14/12  1500 First MD Initiated Contact with Patient 08/14/12 1521    Chief Complaint  Patient presents with  . Sickle Cell Pain Crisis   HPI Pt was getting dialysis when he started getting pain in his lower back today.  This felt similar to his prior sickle cell problems.  Since that time he has been having continued pain.  He does have some cramping in his legs.  No CP or fever.  No vomiting or diarrhea.  No swelling or injuries.  The pain is aching and thobbing.  Nothing seems to help. Past Medical History  Diagnosis Date  . Sickle cell disease, type SS   . Stroke   . Asthma   . Renal disorder     Past Surgical History  Procedure Date  . Cholecystectomy   . Renal biopsy     Family History  Problem Relation Age of Onset  . Diabetes Sister     History  Substance Use Topics  . Smoking status: Never Smoker   . Smokeless tobacco: Never Used  . Alcohol Use: No      Review of Systems  All other systems reviewed and are negative.    Allergies  Codeine  Home Medications  No current outpatient prescriptions on file.  BP 115/57  Pulse 78  Temp 98.4 F (36.9 C) (Oral)  Resp 24  SpO2 100%  Physical Exam  Nursing note and vitals reviewed. Constitutional: He appears well-developed and well-nourished. No distress.  HENT:  Head: Normocephalic and atraumatic.  Right Ear: External ear normal.  Left Ear: External ear normal.  Eyes: Conjunctivae normal are normal. Right eye exhibits no discharge. Left eye exhibits no discharge. Scleral icterus is present.  Neck: Neck supple. No tracheal deviation present.  Cardiovascular: Normal rate, regular rhythm and intact distal pulses.   Pulmonary/Chest: Effort normal and breath sounds normal. No stridor. No respiratory distress. He has no wheezes. He has no rales.  Abdominal: Soft. Bowel sounds are normal. He exhibits no distension. There is no tenderness. There is no rebound  and no guarding.  Musculoskeletal: He exhibits no edema and no tenderness.       Lumbar back: He exhibits decreased range of motion and pain. He exhibits no tenderness, no swelling, no edema and no deformity.  Neurological: He is alert. He has normal strength. No sensory deficit. Cranial nerve deficit:  no gross defecits noted. He exhibits normal muscle tone. He displays no seizure activity. Coordination normal.  Skin: Skin is warm and dry. No rash noted.  Psychiatric: He has a normal mood and affect.    ED Course  Procedures (including critical care time) ED course, IV fluids, IV diluadid, oxygen Labs Reviewed  CBC WITH DIFFERENTIAL - Abnormal; Notable for the following:    WBC 26.0 (*)  WHITE COUNT CONFIRMED ON SMEAR   RBC 2.56 (*)     Hemoglobin 7.3 (*)     HCT 21.9 (*)     RDW 17.6 (*)     Neutrophils Relative 79 (*)     Lymphocytes Relative 9 (*)     Neutro Abs 20.6 (*)     Monocytes Absolute 2.1 (*)     Eosinophils Absolute 1.0 (*)     All other components within normal limits  COMPREHENSIVE METABOLIC PANEL - Abnormal; Notable for the following:    Potassium 3.3 (*)     Creatinine, Ser 3.54 (*)     Alkaline  Phosphatase 138 (*)     Total Bilirubin 2.9 (*)     GFR calc non Af Amer 23 (*)     GFR calc Af Amer 26 (*)     All other components within normal limits  RETICULOCYTES - Abnormal; Notable for the following:    Retic Ct Pct 3.8 (*)     RBC. 2.56 (*)     All other components within normal limits   No results found.  Dx: Sickle cell pain crisis   MDM  Pt states pain has resolved.   Consistent with sickle cell pain crisis.  Anemia is stable.  Chronic renal failure is without acute complications.  Pt comfortable with DC home.  Has medcations available if he needs them.        Celene Kras, MD 08/14/12 1800

## 2012-08-14 NOTE — ED Notes (Signed)
Pt had 45 minutes left of Dialysis and started having lower back pain and bilateral side pain.

## 2012-08-29 ENCOUNTER — Encounter (HOSPITAL_COMMUNITY): Payer: Self-pay | Admitting: *Deleted

## 2012-08-29 ENCOUNTER — Emergency Department (HOSPITAL_COMMUNITY)
Admission: EM | Admit: 2012-08-29 | Discharge: 2012-08-29 | Disposition: A | Discharge status: Home / Self Care | Payer: Federal, State, Local not specified - PPO | Attending: Emergency Medicine | Admitting: Emergency Medicine

## 2012-08-29 DIAGNOSIS — Z8673 Personal history of transient ischemic attack (TIA), and cerebral infarction without residual deficits: Secondary | ICD-10-CM | POA: Insufficient documentation

## 2012-08-29 DIAGNOSIS — N186 End stage renal disease: Secondary | ICD-10-CM | POA: Insufficient documentation

## 2012-08-29 DIAGNOSIS — J45909 Unspecified asthma, uncomplicated: Secondary | ICD-10-CM | POA: Insufficient documentation

## 2012-08-29 DIAGNOSIS — D5700 Hb-SS disease with crisis, unspecified: Secondary | ICD-10-CM

## 2012-08-29 LAB — CBC WITH DIFFERENTIAL/PLATELET
Basophils Relative: 0 % (ref 0–1)
Eosinophils Absolute: 1.1 10*3/uL — ABNORMAL HIGH (ref 0.0–0.7)
MCH: 29.2 pg (ref 26.0–34.0)
MCV: 85.5 fL (ref 78.0–100.0)
Monocytes Relative: 7 % (ref 3–12)
Neutro Abs: 10.1 10*3/uL — ABNORMAL HIGH (ref 1.7–7.7)
Neutrophils Relative %: 71 % (ref 43–77)
Platelets: 153 10*3/uL (ref 150–400)
RBC: 3.25 MIL/uL — ABNORMAL LOW (ref 4.22–5.81)
RDW: 17.7 % — ABNORMAL HIGH (ref 11.5–15.5)
WBC: 14.2 10*3/uL — ABNORMAL HIGH (ref 4.0–10.5)

## 2012-08-29 LAB — BASIC METABOLIC PANEL
CO2: 25 mEq/L (ref 19–32)
Calcium: 9.9 mg/dL (ref 8.4–10.5)
Glucose, Bld: 82 mg/dL (ref 70–99)
Potassium: 3.5 mEq/L (ref 3.5–5.1)
Sodium: 135 mEq/L (ref 135–145)

## 2012-08-29 MED ORDER — HYDROMORPHONE HCL PF 1 MG/ML IJ SOLN
1.0000 mg | Freq: Once | INTRAMUSCULAR | Status: AC
  Administered 2012-08-29: 1 mg via INTRAVENOUS
  Filled 2012-08-29: qty 1

## 2012-08-29 NOTE — ED Notes (Signed)
Pt just left dialysis and receiving a blood transfusion, 15 min ago back pain started

## 2012-08-29 NOTE — ED Provider Notes (Signed)
History     CSN: [id number]  Arrival date & time 08/29/12  1213   First MD Initiated Contact with Patient 08/29/12 1224      Chief Complaint  Patient presents with  . Sickle Cell Pain Crisis    (Consider location/radiation/quality/duration/timing/severity/associated sxs/prior treatment) The history is provided by the patient and medical records.   patient reports a history of sickle cell disease and reports he has Dilaudid at home for his pain.  Today he developed worsening pain in his bilateral hips consistent with his sickle cell crisis pain.  He came immediately to the emergency department from dialysis and has not tried any of his home pain medications.  He receives dialysis on Tuesday Thursdays and Saturdays but received his dialysis today because of the holiday schedule.  He states that he received a blood transfusion while at dialysis today.  He's had no fevers or chills.  He denies cough congestion or shortness of breath.  He has no chest pain.  He does not currently follow with a hematologist.  When he was a child he followed at [facility] for his sickle cell disease.  Currently his primary care physician manages his sickle cell pain crisis with pain medications.  He denies nausea vomiting or abdominal pain.  He denies new weakness of his upper lower extremities.  His pain is moderate to severe at this time  Past Medical History  Diagnosis Date  . Sickle cell disease, type SS   . Stroke   . Asthma   . Renal disorder     Past Surgical History  Procedure Date  . Cholecystectomy   . Renal biopsy     Family History  Problem Relation Age of Onset  . Diabetes Sister     History  Substance Use Topics  . Smoking status: Never Smoker   . Smokeless tobacco: Never Used  . Alcohol Use: No      Review of Systems  All other systems reviewed and are negative.    Allergies  Codeine  Home Medications  No current outpatient prescriptions on file.  BP 102/67  Pulse  75  Temp 98.6 F (37 C) (Oral)  Resp 16  SpO2 100%  Physical Exam  Nursing note and vitals reviewed. Constitutional: He is oriented to person, place, and time. He appears well-developed and well-nourished.  HENT:  Head: Normocephalic and atraumatic.  Eyes: EOM are normal.  Neck: Normal range of motion.  Cardiovascular: Normal rate, regular rhythm, normal heart sounds and intact distal pulses.   Pulmonary/Chest: Effort normal and breath sounds normal. No respiratory distress.  Abdominal: Soft. He exhibits no distension. There is no tenderness.  Musculoskeletal: Normal range of motion.  Neurological: He is alert and oriented to person, place, and time.  Skin: Skin is warm and dry.  Psychiatric: He has a normal mood and affect. Judgment normal.    ED Course  Procedures (including critical care time)  Labs Reviewed  CBC WITH DIFFERENTIAL - Abnormal; Notable for the following:    WBC 14.2 (*)     RBC 3.25 (*)     Hemoglobin 9.5 (*)     HCT 27.8 (*)     RDW 17.7 (*)     Eosinophils Relative 8 (*)     Neutro Abs 10.1 (*)     Eosinophils Absolute 1.1 (*)     All other components within normal limits  BASIC METABOLIC PANEL - Abnormal; Notable for the following:    Creatinine, Ser 3.37 (*)  GFR calc non Af Amer 24 (*)     GFR calc Af Amer 28 (*)     All other components within normal limits   No results found.   1. Sickle cell pain crisis       MDM  2:00 PM The patient feels much better at this time.  He reports his pain is nearly resolved.  His vital signs are normal.  He's currently on dialysis.  Close PCP followup and followup with his dialysis team.  His discomfort today seems consistent with a sickle cell crisis.  His creatinine is consistent with his baseline        Lyanne Co, MD 08/29/12 1402

## 2012-10-01 HISTORY — PX: AV FISTULA PLACEMENT: SHX1204

## 2012-11-15 ENCOUNTER — Encounter (HOSPITAL_COMMUNITY): Payer: Self-pay | Admitting: *Deleted

## 2012-11-15 ENCOUNTER — Emergency Department (HOSPITAL_COMMUNITY): Payer: Federal, State, Local not specified - PPO

## 2012-11-15 ENCOUNTER — Emergency Department (HOSPITAL_COMMUNITY)
Admission: EM | Admit: 2012-11-15 | Discharge: 2012-11-15 | Disposition: A | Discharge status: Home / Self Care | Payer: Federal, State, Local not specified - PPO | Attending: Emergency Medicine | Admitting: Emergency Medicine

## 2012-11-15 DIAGNOSIS — R51 Headache: Secondary | ICD-10-CM | POA: Insufficient documentation

## 2012-11-15 DIAGNOSIS — Z8673 Personal history of transient ischemic attack (TIA), and cerebral infarction without residual deficits: Secondary | ICD-10-CM | POA: Insufficient documentation

## 2012-11-15 DIAGNOSIS — Z992 Dependence on renal dialysis: Secondary | ICD-10-CM | POA: Insufficient documentation

## 2012-11-15 DIAGNOSIS — N186 End stage renal disease: Secondary | ICD-10-CM | POA: Insufficient documentation

## 2012-11-15 DIAGNOSIS — J45909 Unspecified asthma, uncomplicated: Secondary | ICD-10-CM | POA: Insufficient documentation

## 2012-11-15 DIAGNOSIS — R109 Unspecified abdominal pain: Secondary | ICD-10-CM | POA: Insufficient documentation

## 2012-11-15 DIAGNOSIS — D5700 Hb-SS disease with crisis, unspecified: Secondary | ICD-10-CM | POA: Insufficient documentation

## 2012-11-15 DIAGNOSIS — M545 Low back pain, unspecified: Secondary | ICD-10-CM | POA: Insufficient documentation

## 2012-11-15 DIAGNOSIS — R079 Chest pain, unspecified: Secondary | ICD-10-CM | POA: Insufficient documentation

## 2012-11-15 LAB — COMPREHENSIVE METABOLIC PANEL
Alkaline Phosphatase: 152 U/L — ABNORMAL HIGH (ref 39–117)
BUN: 23 mg/dL (ref 6–23)
Chloride: 100 mEq/L (ref 96–112)
GFR calc Af Amer: 24 mL/min — ABNORMAL LOW (ref >90)
GFR calc non Af Amer: 21 mL/min — ABNORMAL LOW (ref >90)
Glucose, Bld: 80 mg/dL (ref 70–99)
Potassium: 3.5 mEq/L (ref 3.5–5.1)
Total Bilirubin: 3.4 mg/dL — ABNORMAL HIGH (ref 0.3–1.2)

## 2012-11-15 LAB — CBC
Platelets: 260 10*3/uL (ref 150–400)
RDW: 17.7 % — ABNORMAL HIGH (ref 11.5–15.5)
WBC: 24.6 10*3/uL — ABNORMAL HIGH (ref 4.0–10.5)

## 2012-11-15 LAB — TYPE AND SCREEN: Antibody Screen: NEGATIVE

## 2012-11-15 MED ORDER — HYDROMORPHONE HCL PF 1 MG/ML IJ SOLN
2.0000 mg | Freq: Once | INTRAMUSCULAR | Status: AC
  Administered 2012-11-15: 2 mg via INTRAVENOUS
  Filled 2012-11-15: qty 2
  Filled 2012-11-15: qty 1

## 2012-11-15 MED ORDER — OXYCODONE-ACETAMINOPHEN 5-325 MG PO TABS: 1.0000 | ORAL_TABLET | Freq: Four times a day (QID) | ORAL | Status: DC | PRN

## 2012-11-15 MED ORDER — ONDANSETRON HCL 4 MG/2ML IJ SOLN
4.0000 mg | Freq: Once | INTRAMUSCULAR | Status: AC
  Administered 2012-11-15: 4 mg via INTRAVENOUS
  Filled 2012-11-15: qty 2

## 2012-11-15 MED ORDER — SODIUM CHLORIDE 0.9 % IV BOLUS (SEPSIS)
500.0000 mL | Freq: Once | INTRAVENOUS | Status: AC
  Administered 2012-11-15: 500 mL via INTRAVENOUS

## 2012-11-15 NOTE — ED Notes (Signed)
Pt brought here from [facility] 2.5 hours into dialysis for slow onset chest pain and flank pain.  Pt on dialysis r/t his sickle cell disease.  Per dialysis staff, this often happens during dialysis.  Per EMS rhythm strip is nsr.

## 2012-11-15 NOTE — ED Notes (Addendum)
Pt denies pain.  Calling family to pick him up.

## 2012-11-15 NOTE — ED Notes (Signed)
Pt states this is his typical ss crisis pain.  Pain is like a band that radiates around his lower abd. Pt also c/o sternal chest pain and states this often happens during dialysis.  Denies sob.  VS stable.

## 2012-11-15 NOTE — ED Provider Notes (Signed)
History     CSN: [id number]  Arrival date & time 11/15/12  1309   First MD Initiated Contact with Patient 11/15/12 1350      Chief Complaint  Patient presents with  . Chest Pain  . Flank Pain  . Sickle Cell Pain Crisis    (Consider location/radiation/quality/duration/timing/severity/associated sxs/prior treatment) Patient is a 27 y.o. male presenting with chest pain, flank pain, and sickle cell pain. The history is provided by the patient.  Chest Pain Associated symptoms: headache   Associated symptoms: no abdominal pain, no back pain, no cough, no fever, no numbness, no palpitations, no shortness of breath, not vomiting and no weakness   Flank Pain Associated symptoms include chest pain and headaches. Pertinent negatives include no abdominal pain and no shortness of breath.  Sickle Cell Pain Crisis  Associated symptoms include chest pain and headaches. Pertinent negatives include no abdominal pain, no diarrhea, no vomiting, no back pain, no neck pain, no weakness, no cough, no rash and no eye pain.  pt w hx sickle cell disease c/o 'sickle cell pain', low back, chest, and head. States same symptoms w prior sickle cell pain crisis. Pain for past day, constant, mod -severe. No specific exacerbating or alleviating factors. No pain meds at home. No cough or uri c/o. No fever or chills. No sob. Headache dull frontal moderate, gradual onset c/w prior headaches. No neck pain. No sinus congestion, pain or drainage. No eye pain or change in vision. No numbness/weakness. No abd pain. No nvd. Normal appetite. Has esrd on hd, today went to his normal hd, completed 2/5 of 4 hr run, states taken off early due to having sickle cell pain crisis.     Past Medical History  Diagnosis Date  . Sickle cell disease, type SS   . Asthma   . Renal disorder     end stage renal failure  . Stroke 2000    no deficits    Past Surgical History  Procedure Laterality Date  . Cholecystectomy    . Renal biopsy       Family History  Problem Relation Age of Onset  . Diabetes Sister     History  Substance Use Topics  . Smoking status: Never Smoker   . Smokeless tobacco: Never Used  . Alcohol Use: No      Review of Systems  Constitutional: Negative for fever and chills.  HENT: Negative for neck pain.   Eyes: Negative for pain and visual disturbance.  Respiratory: Negative for cough and shortness of breath.   Cardiovascular: Positive for chest pain. Negative for palpitations and leg swelling.  Gastrointestinal: Negative for vomiting, abdominal pain and diarrhea.  Endocrine: Negative for polyuria.  Genitourinary: Positive for flank pain.  Musculoskeletal: Negative for back pain.  Skin: Negative for rash.  Neurological: Positive for headaches. Negative for weakness and numbness.  Hematological: Does not bruise/bleed easily.  Psychiatric/Behavioral: Negative for confusion.    Allergies  Codeine  Home Medications  No current outpatient prescriptions on file.  BP 120/74  Pulse 86  Temp(Src) 98.3 F (36.8 C) (Oral)  Resp 18  SpO2 99%  Physical Exam  Nursing note and vitals reviewed. Constitutional: He is oriented to person, place, and time. He appears well-developed and well-nourished. No distress.  HENT:  Head: Atraumatic.  Mouth/Throat: Oropharynx is clear and moist.  No sinus or temporal tenderness.   Eyes: Conjunctivae are normal. Pupils are equal, round, and reactive to light.  Neck: Neck supple. No tracheal deviation present.  No stiffness or rigidity  Cardiovascular: Normal rate, regular rhythm, normal heart sounds and intact distal pulses.   Pulmonary/Chest: Effort normal. No accessory muscle usage. No respiratory distress. He has no rales. He exhibits tenderness.  Right chest dialyses cath site without purulent drainage, erythema, pain, or tenderness.   Abdominal: Soft. Bowel sounds are normal. He exhibits no distension. There is no tenderness.  Genitourinary:  No  cva tenderness  Musculoskeletal: Normal range of motion. He exhibits no edema and no tenderness.  CTLS spine, non tender, aligned, no step off.   Neurological: He is alert and oriented to person, place, and time.  Motor intact bil.   Skin: Skin is warm and dry.  Psychiatric: He has a normal mood and affect.    ED Course  Procedures (including critical care time)  Results for orders placed during the hospital encounter of 11/15/12  COMPREHENSIVE METABOLIC PANEL      Result Value Range   Sodium 134 (*) 135 - 145 mEq/L   Potassium 3.5  3.5 - 5.1 mEq/L   Chloride 100  96 - 112 mEq/L   CO2 26  19 - 32 mEq/L   Glucose, Bld 80  70 - 99 mg/dL   BUN 23  6 - 23 mg/dL   Creatinine, Ser 4.09 (*) 0.50 - 1.35 mg/dL   Calcium 9.5  8.4 - 81.1 mg/dL   Total Protein 8.0  6.0 - 8.3 g/dL   Albumin 3.5  3.5 - 5.2 g/dL   AST 22  0 - 37 U/L   ALT 12  0 - 53 U/L   Alkaline Phosphatase 152 (*) 39 - 117 U/L   Total Bilirubin 3.4 (*) 0.3 - 1.2 mg/dL   GFR calc non Af Amer 21 (*) >90 mL/min   GFR calc Af Amer 24 (*) >90 mL/min  CBC      Result Value Range   WBC 24.6 (*) 4.0 - 10.5 K/uL   RBC 2.84 (*) 4.22 - 5.81 MIL/uL   Hemoglobin 8.4 (*) 13.0 - 17.0 g/dL   HCT 91.4 (*) 78.2 - 95.6 %   MCV 88.0  78.0 - 100.0 fL   MCH 29.6  26.0 - 34.0 pg   MCHC 33.6  30.0 - 36.0 g/dL   RDW 21.3 (*) 08.6 - 57.8 %   Platelets 260  150 - 400 K/uL  TROPONIN I      Result Value Range   Troponin I <0.30  <0.30 ng/mL  RETICULOCYTES      Result Value Range   Retic Ct Pct 4.6 (*) 0.4 - 3.1 %   RBC. 2.84 (*) 4.22 - 5.81 MIL/uL   Retic Count, Manual 130.6  19.0 - 186.0 K/uL   Dg Chest 2 View  11/15/2012  *RADIOLOGY REPORT*  Clinical Data: Sickle cell crisis, pain.  CHEST - 2 VIEW  Comparison: 03/14/2012  Findings: Right dialysis catheter is in place with the tip in the lower SVC. Heart and mediastinal contours are within normal limits. No focal opacities or effusions.  No acute bony abnormality.  IMPRESSION: No active  cardiopulmonary disease.   Original Report Authenticated By: Charlett Nose, M.D.        MDM  Iv ns bolus. o2 [place]. Dilaudid iv. zofran iv.  Labs.  Reviewed nursing notes and prior charts for additional history.     Date: 11/15/2012  Rate: 88  Rhythm: normal sinus rhythm  QRS Axis: normal  Intervals: normal  ST/T Wave abnormalities: normal  Conduction Disutrbances:none  Narrative Interpretation:   Old EKG Reviewed: unchanged  Recheck pain much improved, 1/10 per pt. Declines additional pain medication.  Discussed possible admission vs d/c home w pcp f/u.  Pt requests d/c home. Denies cp or sob. No fevers or chills.        Suzi Roots, MD 11/15/12 [phone]

## 2012-11-29 ENCOUNTER — Encounter (HOSPITAL_COMMUNITY): Payer: Self-pay | Admitting: Family Medicine

## 2012-11-29 ENCOUNTER — Emergency Department (HOSPITAL_COMMUNITY)
Admission: EM | Admit: 2012-11-29 | Discharge: 2012-11-29 | Disposition: A | Discharge status: Home / Self Care | Payer: Federal, State, Local not specified - PPO | Attending: Emergency Medicine | Admitting: Emergency Medicine

## 2012-11-29 DIAGNOSIS — Z8673 Personal history of transient ischemic attack (TIA), and cerebral infarction without residual deficits: Secondary | ICD-10-CM | POA: Insufficient documentation

## 2012-11-29 DIAGNOSIS — J45909 Unspecified asthma, uncomplicated: Secondary | ICD-10-CM | POA: Insufficient documentation

## 2012-11-29 DIAGNOSIS — R51 Headache: Secondary | ICD-10-CM | POA: Insufficient documentation

## 2012-11-29 DIAGNOSIS — R109 Unspecified abdominal pain: Secondary | ICD-10-CM | POA: Insufficient documentation

## 2012-11-29 DIAGNOSIS — N186 End stage renal disease: Secondary | ICD-10-CM | POA: Insufficient documentation

## 2012-11-29 DIAGNOSIS — Z992 Dependence on renal dialysis: Secondary | ICD-10-CM | POA: Insufficient documentation

## 2012-11-29 DIAGNOSIS — D5700 Hb-SS disease with crisis, unspecified: Secondary | ICD-10-CM | POA: Insufficient documentation

## 2012-11-29 DIAGNOSIS — Z79899 Other long term (current) drug therapy: Secondary | ICD-10-CM | POA: Insufficient documentation

## 2012-11-29 HISTORY — PX: REMOVAL OF A DIALYSIS CATHETER: SHX6053

## 2012-11-29 LAB — CBC WITH DIFFERENTIAL/PLATELET
Basophils Absolute: 0.1 10*3/uL (ref 0.0–0.1)
Basophils Relative: 0 % (ref 0–1)
Lymphocytes Relative: 10 % — ABNORMAL LOW (ref 12–46)
MCHC: 35.6 g/dL (ref 30.0–36.0)
Neutro Abs: 16.5 10*3/uL — ABNORMAL HIGH (ref 1.7–7.7)
Platelets: 228 10*3/uL (ref 150–400)
RDW: 16.4 % — ABNORMAL HIGH (ref 11.5–15.5)
WBC: 21.5 10*3/uL — ABNORMAL HIGH (ref 4.0–10.5)

## 2012-11-29 LAB — RETICULOCYTES
RBC.: 2.97 MIL/uL — ABNORMAL LOW (ref 4.22–5.81)
Retic Count, Absolute: 145.5 10*3/uL (ref 19.0–186.0)

## 2012-11-29 LAB — COMPREHENSIVE METABOLIC PANEL
ALT: 12 U/L (ref 0–53)
AST: 25 U/L (ref 0–37)
Albumin: 3.5 g/dL (ref 3.5–5.2)
CO2: 27 mEq/L (ref 19–32)
Calcium: 8.9 mg/dL (ref 8.4–10.5)
Chloride: 92 mEq/L — ABNORMAL LOW (ref 96–112)
GFR calc non Af Amer: 32 mL/min — ABNORMAL LOW (ref >90)
Sodium: 129 mEq/L — ABNORMAL LOW (ref 135–145)
Total Bilirubin: 3.1 mg/dL — ABNORMAL HIGH (ref 0.3–1.2)

## 2012-11-29 MED ORDER — HYDROMORPHONE HCL PF 2 MG/ML IJ SOLN
2.0000 mg | Freq: Once | INTRAMUSCULAR | Status: AC
  Administered 2012-11-29: 2 mg via INTRAVENOUS
  Filled 2012-11-29: qty 1

## 2012-11-29 MED ORDER — OXYCODONE-ACETAMINOPHEN 5-325 MG PO TABS
1.0000 | ORAL_TABLET | Freq: Once | ORAL | Status: AC
  Administered 2012-11-29: 1 via ORAL
  Filled 2012-11-29: qty 1

## 2012-11-29 NOTE — ED Provider Notes (Signed)
History     CSN: [id number]  Arrival date & time 11/29/12  1043   First MD Initiated Contact with Patient 11/29/12 1045      Chief Complaint  Patient presents with  . Sickle Cell Pain Crisis    (Consider location/radiation/quality/duration/timing/severity/associated sxs/prior treatment) HPI Comments: Patient with history of hemoglobin SS disease, ESRD on dialysis in 03/2012, dialyzes in [facility] -- presents with complaint of typical sickle cell pain. This started while he was at dialysis today. Patient states that dialysis session was typical. Patient states that she developed bilateral flank pain and headache which is not unusual. He denies shortness of breath or chest pain. He denies fever, nausea or vomiting. No diarrhea. No skin rashes. Patient has been taking Percocet at home. Onset of symptoms gradual. Course is gradually worsening. Nothing makes symptoms better worse.  The history is provided by the patient.    Past Medical History  Diagnosis Date  . Sickle cell disease, type SS   . Asthma   . Renal disorder     end stage renal failure  . Stroke 2000    no deficits    Past Surgical History  Procedure Laterality Date  . Cholecystectomy    . Renal biopsy      Family History  Problem Relation Age of Onset  . Diabetes Sister     History  Substance Use Topics  . Smoking status: Never Smoker   . Smokeless tobacco: Never Used  . Alcohol Use: No      Review of Systems  Constitutional: Negative for fever.  HENT: Negative for sore throat and rhinorrhea.   Eyes: Negative for redness.  Respiratory: Negative for cough and shortness of breath.   Cardiovascular: Negative for chest pain.  Gastrointestinal: Negative for nausea, vomiting, abdominal pain and diarrhea.  Genitourinary: Positive for flank pain. Negative for dysuria.  Musculoskeletal: Negative for myalgias.  Skin: Negative for rash.  Neurological: Positive for headaches.    Allergies  Codeine  Home  Medications   Current Outpatient Rx  Name  Route  Sig  Dispense  Refill  . oxyCODONE-acetaminophen (PERCOCET/ROXICET) 5-325 MG per tablet   Oral   Take 1-2 tablets by mouth every 6 (six) hours as needed for pain.         . sevelamer carbonate (RENVELA) 800 MG tablet   Oral   Take 800 mg by mouth 3 (three) times daily with meals.           BP 145/89  Pulse 73  Temp(Src) 98.5 F (36.9 C) (Oral)  Resp 20  Ht 5\' 10"  (1.778 m)  Wt 135 lb (61.236 kg)  BMI 19.37 kg/m2  SpO2 100%  Physical Exam  Nursing note and vitals reviewed. Constitutional: He is oriented to person, place, and time. He appears well-developed and well-nourished.  HENT:  Head: Normocephalic and atraumatic.  Eyes: Conjunctivae are normal. Right eye exhibits no discharge. Left eye exhibits no discharge.  Neck: Normal range of motion. Neck supple.  Cardiovascular: Normal rate, regular rhythm and normal heart sounds.   Pulmonary/Chest: Effort normal and breath sounds normal.  Abdominal: Soft. Bowel sounds are normal. He exhibits no distension. There is tenderness. There is no rebound and no guarding.  Mild flank pain bilaterally  Neurological: He is alert and oriented to person, place, and time. He has normal strength. No cranial nerve deficit or sensory deficit. Coordination normal.  Skin: Skin is warm and dry.  Psychiatric: He has a normal mood and affect.  ED Course  Procedures (including critical care time)  Labs Reviewed  CBC WITH DIFFERENTIAL - Abnormal; Notable for the following:    WBC 21.5 (*)    RBC 2.97 (*)    Hemoglobin 9.0 (*)    HCT 25.3 (*)    RDW 16.4 (*)    Neutro Abs 16.5 (*)    Lymphocytes Relative 10 (*)    Monocytes Absolute 2.1 (*)    Eosinophils Absolute 0.9 (*)    All other components within normal limits  COMPREHENSIVE METABOLIC PANEL - Abnormal; Notable for the following:    Sodium 129 (*)    Potassium 2.9 (*)    Chloride 92 (*)    Creatinine, Ser 2.62 (*)    Alkaline  Phosphatase 128 (*)    Total Bilirubin 3.1 (*)    GFR calc non Af Amer 32 (*)    GFR calc Af Amer 38 (*)    All other components within normal limits  RETICULOCYTES - Abnormal; Notable for the following:    Retic Ct Pct 4.9 (*)    RBC. 2.97 (*)    All other components within normal limits   No results found.   1. Vaso-occlusive sickle cell crisis     11:06 AM Patient seen and examined. Work-up initiated. Medications ordered.   Vital signs reviewed and are as follows: Filed Vitals:   11/29/12 1051  BP: 145/89  Pulse: 73  Temp: 98.5 F (36.9 C)  Resp: 20   Patient with pain resolution after one dose of Dilaudid. Patient's home Percocet ordered.  Labs and case reviewed with Dr. Hyacinth Meeker. Patient agreeable to discharge home. Urged return with worsening or other concerns.    MDM  Patient with history of sickle cell anemia, presents with probable vaso-occlusive pain crisis. Labs are at baseline per patient. Pain is well controlled after one dose of hydromorphone. No chest pain or shortness of breath suggestive of acute chest syndrome. Patient is afebrile does not have any localized symptoms of infection. He appears well, nontoxic. No respiratory distress. Will discharge to home with home pain medications to       [place], [place] 11/29/12 1420

## 2012-11-29 NOTE — ED Notes (Signed)
Per EMS, pt coming from dialysis and during treatment started having H/A and abdominal pain 7/10. BP 138/90 HR 78

## 2012-11-29 NOTE — ED Provider Notes (Signed)
Medical screening examination/treatment/procedure(s) were performed by non-physician practitioner and as supervising physician I was immediately available for consultation/collaboration.    Vida Roller, MD 11/29/12 [phone]

## 2012-11-29 NOTE — ED Notes (Signed)
IV team paged to de access dialysis port

## 2013-01-08 ENCOUNTER — Encounter (HOSPITAL_COMMUNITY): Payer: Self-pay

## 2013-01-08 ENCOUNTER — Emergency Department (HOSPITAL_COMMUNITY)
Admission: EM | Admit: 2013-01-08 | Discharge: 2013-01-08 | Disposition: A | Discharge status: Home / Self Care | Payer: Federal, State, Local not specified - PPO | Attending: Emergency Medicine | Admitting: Emergency Medicine

## 2013-01-08 DIAGNOSIS — I509 Heart failure, unspecified: Secondary | ICD-10-CM | POA: Insufficient documentation

## 2013-01-08 DIAGNOSIS — D5700 Hb-SS disease with crisis, unspecified: Secondary | ICD-10-CM | POA: Insufficient documentation

## 2013-01-08 DIAGNOSIS — N186 End stage renal disease: Secondary | ICD-10-CM | POA: Insufficient documentation

## 2013-01-08 DIAGNOSIS — Z8669 Personal history of other diseases of the nervous system and sense organs: Secondary | ICD-10-CM | POA: Insufficient documentation

## 2013-01-08 DIAGNOSIS — Z79899 Other long term (current) drug therapy: Secondary | ICD-10-CM | POA: Insufficient documentation

## 2013-01-08 DIAGNOSIS — R109 Unspecified abdominal pain: Secondary | ICD-10-CM | POA: Insufficient documentation

## 2013-01-08 DIAGNOSIS — J45909 Unspecified asthma, uncomplicated: Secondary | ICD-10-CM | POA: Insufficient documentation

## 2013-01-08 DIAGNOSIS — Z8673 Personal history of transient ischemic attack (TIA), and cerebral infarction without residual deficits: Secondary | ICD-10-CM | POA: Insufficient documentation

## 2013-01-08 DIAGNOSIS — E213 Hyperparathyroidism, unspecified: Secondary | ICD-10-CM | POA: Insufficient documentation

## 2013-01-08 DIAGNOSIS — M549 Dorsalgia, unspecified: Secondary | ICD-10-CM | POA: Insufficient documentation

## 2013-01-08 HISTORY — DX: Hyperparathyroidism, unspecified: E21.3

## 2013-01-08 HISTORY — DX: Heart failure, unspecified: I50.9

## 2013-01-08 HISTORY — DX: Other specified eating disorder: F50.89

## 2013-01-08 HISTORY — DX: Other psychoactive substance abuse, uncomplicated: F19.10

## 2013-01-08 HISTORY — DX: Anemia, unspecified: D64.9

## 2013-01-08 LAB — CBC WITH DIFFERENTIAL/PLATELET
Basophils Relative: 0 % (ref 0–1)
Eosinophils Absolute: 0.7 10*3/uL (ref 0.0–0.7)
Eosinophils Relative: 4 % (ref 0–5)
HCT: 22.6 % — ABNORMAL LOW (ref 39.0–52.0)
Hemoglobin: 7.8 g/dL — ABNORMAL LOW (ref 13.0–17.0)
Lymphocytes Relative: 12 % (ref 12–46)
MCHC: 34.5 g/dL (ref 30.0–36.0)
Neutro Abs: 13.3 10*3/uL — ABNORMAL HIGH (ref 1.7–7.7)
Neutrophils Relative %: 74 % (ref 43–77)
RBC: 2.65 MIL/uL — ABNORMAL LOW (ref 4.22–5.81)

## 2013-01-08 LAB — COMPREHENSIVE METABOLIC PANEL
ALT: 9 U/L (ref 0–53)
AST: 20 U/L (ref 0–37)
Alkaline Phosphatase: 122 U/L — ABNORMAL HIGH (ref 39–117)
CO2: 27 mEq/L (ref 19–32)
Chloride: 101 mEq/L (ref 96–112)
GFR calc Af Amer: 33 mL/min — ABNORMAL LOW (ref >90)
GFR calc non Af Amer: 28 mL/min — ABNORMAL LOW (ref >90)
Glucose, Bld: 84 mg/dL (ref 70–99)
Potassium: 3.3 mEq/L — ABNORMAL LOW (ref 3.5–5.1)
Sodium: 136 mEq/L (ref 135–145)
Total Bilirubin: 3 mg/dL — ABNORMAL HIGH (ref 0.3–1.2)

## 2013-01-08 LAB — RETICULOCYTES: Retic Count, Absolute: 177.6 10*3/uL (ref 19.0–186.0)

## 2013-01-08 MED ORDER — OXYCODONE-ACETAMINOPHEN 5-325 MG PO TABS: 2.0000 | ORAL_TABLET | ORAL | Status: DC | PRN

## 2013-01-08 MED ORDER — HYDROMORPHONE HCL PF 2 MG/ML IJ SOLN
2.0000 mg | Freq: Once | INTRAMUSCULAR | Status: AC
  Administered 2013-01-08: 2 mg via INTRAVENOUS
  Filled 2013-01-08: qty 1

## 2013-01-08 MED ORDER — SODIUM CHLORIDE 0.9 % IV BOLUS (SEPSIS)
1000.0000 mL | Freq: Once | INTRAVENOUS | Status: AC
  Administered 2013-01-08: 1000 mL via INTRAVENOUS

## 2013-01-08 NOTE — ED Notes (Addendum)
Per [facility], pt came from [facility] for S[facility]le Cell Crisis with pain in lower abd and lower back. Pt had 45 minutes of treatment left when pain started. Had 2 lb taken off but they gave him back 1 lb to help reduce pain. Hemodialysis catheter to right chest deaccessed prior to transport. VSS.

## 2013-01-08 NOTE — ED Provider Notes (Signed)
History     CSN: [id number]  Arrival date & time 01/08/13  1018   First MD Initiated Contact with Patient 01/08/13 1020      Chief Complaint  Patient presents with  . Sickle Cell Pain Crisis    (Consider location/radiation/quality/duration/timing/severity/associated sxs/prior treatment) HPI Comments: Patient is a 27 year old male with a past medical history of hemoglobin SS disease and ESRD who presents with back and abdominal pain. The pain started gradually as the patient was on dialysis. The patient reports cramping lower back and abdominal pain without radiation. He says this feels like sickle cell pain. He did not try anything for symptoms and was brought to the ED from dialysis. No aggravating/alleviating factors. No associated symptoms. He denies chest pain, SOB, headache, fever, NVD.    Past Medical History  Diagnosis Date  . Sickle cell disease, type SS   . Asthma   . Renal disorder     end stage renal failure  . Stroke 2000    no deficits  . Pica   . Anemia   . Hyperparathyroidism   . Polysubstance abuse   . CHF (congestive heart failure)     Past Surgical History  Procedure Laterality Date  . Cholecystectomy    . Renal biopsy      Family History  Problem Relation Age of Onset  . Diabetes Sister     History  Substance Use Topics  . Smoking status: Never Smoker   . Smokeless tobacco: Never Used  . Alcohol Use: No      Review of Systems  Gastrointestinal: Positive for abdominal pain.  Musculoskeletal: Positive for back pain.  All other systems reviewed and are negative.    Allergies  Codeine  Home Medications   Current Outpatient Rx  Name  Route  Sig  Dispense  Refill  . cinacalcet (SENSIPAR) 30 MG tablet   Oral   Take 30 mg by mouth daily.         Marland Kitchen oxyCODONE-acetaminophen (PERCOCET/ROXICET) 5-325 MG per tablet   Oral   Take 1-2 tablets by mouth every 6 (six) hours as needed for pain.         . sevelamer carbonate (RENVELA) 800  MG tablet   Oral   Take 800 mg by mouth 3 (three) times daily with meals.           BP 143/80  Pulse 76  Temp(Src) 98.2 F (36.8 C) (Oral)  Resp 16  SpO2 100%  Physical Exam  Nursing note and vitals reviewed. Constitutional: He is oriented to person, place, and time. He appears well-developed and well-nourished. No distress.  HENT:  Head: Normocephalic and atraumatic.  Eyes: Conjunctivae and EOM are normal. Pupils are equal, round, and reactive to light.  Neck: Normal range of motion.  Cardiovascular: Normal rate and regular rhythm.  Exam reveals no gallop and no friction rub.   No murmur heard. Pulmonary/Chest: Effort normal and breath sounds normal. He has no wheezes. He has no rales. He exhibits no tenderness.  Abdominal: Soft. He exhibits no distension. There is no tenderness. There is no rebound and no guarding.  Musculoskeletal: Normal range of motion.  No tenderness to palpation of generalized back. No midline spine tenderness.   Neurological: He is alert and oriented to person, place, and time. Coordination normal.  Speech is goal-oriented. Moves limbs without ataxia.   Skin: Skin is warm and dry.  Psychiatric: He has a normal mood and affect. His behavior is normal.  ED Course  Procedures (including critical care time)  Labs Reviewed  CBC WITH DIFFERENTIAL - Abnormal; Notable for the following:    WBC 18.0 (*)    RBC 2.65 (*)    Hemoglobin 7.8 (*)    HCT 22.6 (*)    RDW 17.5 (*)    Neutro Abs 13.3 (*)    Monocytes Absolute 1.8 (*)    All other components within normal limits  COMPREHENSIVE METABOLIC PANEL - Abnormal; Notable for the following:    Potassium 3.3 (*)    Creatinine, Ser 2.95 (*)    Alkaline Phosphatase 122 (*)    Total Bilirubin 3.0 (*)    GFR calc non Af Amer 28 (*)    GFR calc Af Amer 33 (*)    All other components within normal limits  RETICULOCYTES - Abnormal; Notable for the following:    Retic Ct Pct 6.7 (*)    RBC. 2.65 (*)     All other components within normal limits   No results found.   1. Sickle cell pain crisis       MDM  12:02 PM Patient feeling better after dilaudid. Labs show no acute changes.   12:42 PM Patient received fluids and is now ready to go home. I will discharge the patient with a small dose of Percocet. I instructed him to follow up with his PCP. Vitals stable and patient is afebrile at this time. Patient denies chest pain. Patient instructed to return with worsening or concerning symptoms.       Emilia Beck, PA-C 01/08/13 1256

## 2013-01-10 NOTE — ED Provider Notes (Signed)
Medical screening examination/treatment/procedure(s) were performed by non-physician practitioner and as supervising physician I was immediately available for consultation/collaboration.   Machael Raine M Desaree Downen, DO 01/10/13 1210 

## 2013-03-07 ENCOUNTER — Encounter (HOSPITAL_COMMUNITY): Payer: Self-pay

## 2013-03-07 ENCOUNTER — Inpatient Hospital Stay (HOSPITAL_COMMUNITY)
Admission: EM | Admit: 2013-03-07 | Discharge: 2013-03-09 | DRG: 395 | Disposition: A | Discharge status: Home / Self Care | Payer: Federal, State, Local not specified - PPO | Attending: Internal Medicine | Admitting: Internal Medicine

## 2013-03-07 DIAGNOSIS — Z8673 Personal history of transient ischemic attack (TIA), and cerebral infarction without residual deficits: Secondary | ICD-10-CM

## 2013-03-07 DIAGNOSIS — D571 Sickle-cell disease without crisis: Secondary | ICD-10-CM | POA: Diagnosis present

## 2013-03-07 DIAGNOSIS — D649 Anemia, unspecified: Secondary | ICD-10-CM

## 2013-03-07 DIAGNOSIS — N2581 Secondary hyperparathyroidism of renal origin: Secondary | ICD-10-CM | POA: Diagnosis present

## 2013-03-07 DIAGNOSIS — N186 End stage renal disease: Secondary | ICD-10-CM

## 2013-03-07 DIAGNOSIS — N19 Unspecified kidney failure: Secondary | ICD-10-CM

## 2013-03-07 DIAGNOSIS — I509 Heart failure, unspecified: Secondary | ICD-10-CM | POA: Diagnosis present

## 2013-03-07 DIAGNOSIS — Z992 Dependence on renal dialysis: Secondary | ICD-10-CM

## 2013-03-07 DIAGNOSIS — D5700 Hb-SS disease with crisis, unspecified: Principal | ICD-10-CM

## 2013-03-07 DIAGNOSIS — Z79899 Other long term (current) drug therapy: Secondary | ICD-10-CM

## 2013-03-07 DIAGNOSIS — Z833 Family history of diabetes mellitus: Secondary | ICD-10-CM

## 2013-03-07 DIAGNOSIS — J45909 Unspecified asthma, uncomplicated: Secondary | ICD-10-CM | POA: Diagnosis present

## 2013-03-07 HISTORY — DX: Dependence on renal dialysis: N18.6

## 2013-03-07 LAB — CBC WITH DIFFERENTIAL/PLATELET
Basophils Absolute: 0 10*3/uL (ref 0.0–0.1)
Basophils Relative: 0 % (ref 0–1)
Eosinophils Absolute: 0.8 10*3/uL — ABNORMAL HIGH (ref 0.0–0.7)
HCT: 14.5 % — ABNORMAL LOW (ref 39.0–52.0)
Hemoglobin: 5.2 g/dL — CL (ref 13.0–17.0)
Lymphocytes Relative: 20 % (ref 12–46)
Lymphs Abs: 2.4 10*3/uL (ref 0.7–4.0)
MCHC: 35.9 g/dL (ref 30.0–36.0)
MCV: 87.3 fL (ref 78.0–100.0)
Neutro Abs: 7.9 10*3/uL — ABNORMAL HIGH (ref 1.7–7.7)
RDW: 18.9 % — ABNORMAL HIGH (ref 11.5–15.5)

## 2013-03-07 LAB — COMPREHENSIVE METABOLIC PANEL
ALT: 14 U/L (ref 0–53)
Alkaline Phosphatase: 80 U/L (ref 39–117)
BUN: 37 mg/dL — ABNORMAL HIGH (ref 6–23)
CO2: 26 mEq/L (ref 19–32)
Calcium: 8.6 mg/dL (ref 8.4–10.5)
GFR calc Af Amer: 17 mL/min — ABNORMAL LOW (ref >90)
GFR calc non Af Amer: 14 mL/min — ABNORMAL LOW (ref >90)
Glucose, Bld: 92 mg/dL (ref 70–99)
Potassium: 4 mEq/L (ref 3.5–5.1)
Sodium: 141 mEq/L (ref 135–145)

## 2013-03-07 LAB — URINALYSIS, ROUTINE W REFLEX MICROSCOPIC
Bilirubin Urine: NEGATIVE
Ketones, ur: NEGATIVE mg/dL
Leukocytes, UA: NEGATIVE
Nitrite: NEGATIVE
Urobilinogen, UA: 1 mg/dL (ref 0.0–1.0)

## 2013-03-07 LAB — PREPARE RBC (CROSSMATCH)

## 2013-03-07 MED ORDER — FOLIC ACID 1 MG PO TABS
1.0000 mg | ORAL_TABLET | Freq: Every day | ORAL | Status: DC
  Administered 2013-03-07 – 2013-03-08 (×2): 1 mg via ORAL
  Filled 2013-03-07 (×3): qty 1

## 2013-03-07 MED ORDER — NEPRO/CARBSTEADY PO LIQD
237.0000 mL | ORAL | Status: DC | PRN
  Filled 2013-03-07: qty 237

## 2013-03-07 MED ORDER — LIDOCAINE-PRILOCAINE 2.5-2.5 % EX CREA: 1.0000 "application " | TOPICAL_CREAM | CUTANEOUS | Status: DC | PRN

## 2013-03-07 MED ORDER — SODIUM CHLORIDE 0.9 % IJ SOLN: 3.0000 mL | INTRAMUSCULAR | Status: DC | PRN

## 2013-03-07 MED ORDER — ACETAMINOPHEN 325 MG PO TABS: 650.0000 mg | ORAL_TABLET | Freq: Four times a day (QID) | ORAL | Status: DC | PRN

## 2013-03-07 MED ORDER — SODIUM CHLORIDE 0.9 % IV SOLN: 100.0000 mL | INTRAVENOUS | Status: DC | PRN

## 2013-03-07 MED ORDER — HYDROMORPHONE HCL PF 1 MG/ML IJ SOLN
1.0000 mg | Freq: Once | INTRAMUSCULAR | Status: AC
  Administered 2013-03-07: 1 mg via INTRAVENOUS
  Filled 2013-03-07: qty 1

## 2013-03-07 MED ORDER — ACETAMINOPHEN 650 MG RE SUPP
650.0000 mg | Freq: Four times a day (QID) | RECTAL | Status: DC | PRN
  Filled 2013-03-07: qty 1

## 2013-03-07 MED ORDER — CINACALCET HCL 30 MG PO TABS
30.0000 mg | ORAL_TABLET | Freq: Every day | ORAL | Status: DC
  Administered 2013-03-08 – 2013-03-09 (×2): 30 mg via ORAL
  Filled 2013-03-07 (×3): qty 1

## 2013-03-07 MED ORDER — OXYCODONE-ACETAMINOPHEN 5-325 MG PO TABS
1.0000 | ORAL_TABLET | Freq: Four times a day (QID) | ORAL | Status: DC | PRN
  Administered 2013-03-09 (×2): 2 via ORAL
  Filled 2013-03-07 (×2): qty 2

## 2013-03-07 MED ORDER — ONDANSETRON HCL 4 MG/2ML IJ SOLN: 4.0000 mg | Freq: Four times a day (QID) | INTRAMUSCULAR | Status: DC | PRN

## 2013-03-07 MED ORDER — DIPHENHYDRAMINE HCL 50 MG/ML IJ SOLN
25.0000 mg | Freq: Four times a day (QID) | INTRAMUSCULAR | Status: DC | PRN
  Administered 2013-03-07 – 2013-03-08 (×4): 25 mg via INTRAVENOUS
  Filled 2013-03-07 (×4): qty 1

## 2013-03-07 MED ORDER — HEPARIN SODIUM (PORCINE) 5000 UNIT/ML IJ SOLN
5000.0000 [IU] | Freq: Three times a day (TID) | INTRAMUSCULAR | Status: DC
  Administered 2013-03-07: 5000 [IU] via SUBCUTANEOUS
  Filled 2013-03-07 (×8): qty 1

## 2013-03-07 MED ORDER — SODIUM CHLORIDE 0.9 % IJ SOLN
3.0000 mL | Freq: Two times a day (BID) | INTRAMUSCULAR | Status: DC
  Administered 2013-03-07 – 2013-03-09 (×4): 3 mL via INTRAVENOUS

## 2013-03-07 MED ORDER — ALTEPLASE 2 MG IJ SOLR
2.0000 mg | Freq: Once | INTRAMUSCULAR | Status: AC | PRN
  Filled 2013-03-07: qty 2

## 2013-03-07 MED ORDER — HYDROMORPHONE HCL PF 1 MG/ML IJ SOLN
1.0000 mg | INTRAMUSCULAR | Status: DC | PRN
  Administered 2013-03-07 – 2013-03-09 (×13): 1 mg via INTRAVENOUS
  Filled 2013-03-07 (×12): qty 1

## 2013-03-07 MED ORDER — SODIUM CHLORIDE 0.9 % IV SOLN: 25.0000 mg | Freq: Four times a day (QID) | INTRAVENOUS | Status: DC | PRN

## 2013-03-07 MED ORDER — PENTAFLUOROPROP-TETRAFLUOROETH EX AERO
1.0000 "application " | INHALATION_SPRAY | CUTANEOUS | Status: DC | PRN
  Filled 2013-03-07: qty 103.5

## 2013-03-07 MED ORDER — HEPARIN SODIUM (PORCINE) 1000 UNIT/ML DIALYSIS
1000.0000 [IU] | INTRAMUSCULAR | Status: DC | PRN
  Filled 2013-03-07: qty 1

## 2013-03-07 MED ORDER — LIDOCAINE HCL (PF) 1 % IJ SOLN: 5.0000 mL | INTRAMUSCULAR | Status: DC | PRN

## 2013-03-07 MED ORDER — HYDROMORPHONE HCL PF 1 MG/ML IJ SOLN: 1.0000 mg | INTRAMUSCULAR | Status: DC | PRN

## 2013-03-07 MED ORDER — HEPARIN SODIUM (PORCINE) 1000 UNIT/ML DIALYSIS
2500.0000 [IU] | Freq: Once | INTRAMUSCULAR | Status: DC
  Filled 2013-03-07: qty 3

## 2013-03-07 MED ORDER — SENNA 8.6 MG PO TABS
1.0000 | ORAL_TABLET | Freq: Two times a day (BID) | ORAL | Status: DC
  Administered 2013-03-07 – 2013-03-09 (×4): 8.6 mg via ORAL
  Filled 2013-03-07 (×5): qty 1

## 2013-03-07 MED ORDER — SODIUM CHLORIDE 0.9 % IV BOLUS (SEPSIS)
500.0000 mL | Freq: Once | INTRAVENOUS | Status: AC
  Administered 2013-03-07: 500 mL via INTRAVENOUS

## 2013-03-07 MED ORDER — ONDANSETRON HCL 4 MG PO TABS
4.0000 mg | ORAL_TABLET | Freq: Four times a day (QID) | ORAL | Status: DC | PRN
  Filled 2013-03-07: qty 1

## 2013-03-07 MED ORDER — SEVELAMER CARBONATE 800 MG PO TABS
800.0000 mg | ORAL_TABLET | Freq: Three times a day (TID) | ORAL | Status: DC
Start: 2013-03-07 — End: 2013-03-09
  Administered 2013-03-07 – 2013-03-09 (×6): 800 mg via ORAL
  Filled 2013-03-07 (×8): qty 1

## 2013-03-07 MED ORDER — DIPHENHYDRAMINE HCL 50 MG/ML IJ SOLN
INTRAMUSCULAR | Status: AC
  Administered 2013-03-07: 25 mg
  Filled 2013-03-07: qty 1

## 2013-03-07 MED ORDER — ALBUTEROL SULFATE (5 MG/ML) 0.5% IN NEBU: 2.5000 mg | INHALATION_SOLUTION | RESPIRATORY_TRACT | Status: DC | PRN

## 2013-03-07 MED ORDER — HYDROMORPHONE HCL PF 1 MG/ML IJ SOLN
INTRAMUSCULAR | Status: AC
  Filled 2013-03-07: qty 1

## 2013-03-07 MED ORDER — SODIUM CHLORIDE 0.9 % IV SOLN: 250.0000 mL | INTRAVENOUS | Status: DC | PRN

## 2013-03-07 NOTE — H&P (Signed)
PATIENT DETAILS Name: Philip Rios Age: 27 y.o. Sex: male Date of Birth: 05-Dec-1985 Admit Date: 03/07/2013 JXB:[id number],AOZHY A, MD   CHIEF COMPLAINT:  Back pain  HPI: Philip Rios is a 27 y.o. male with a Past Medical History of sickle cell disease and end-stage renal disease on hemodialysis who presents today with the above noted complaint. The patient he was in his usual state of health till yesterday, today while at dialysis he started developing low back pain that gradually became intense. Since the pain started worsening, patient could only completed one hour off his usual dialysis, dialysis was stopped and the patient was referred to the emergency room for further evaluation. In the ED, patient was found to have a hemoglobin of 5.2, I was subsequently asked to admit this patient for further evaluation and treatment. During my evaluation, patient seemed comfortable, his only complaint was his pain in the low back area which feels like his typical Pain off a sickle cell crisis. He denies vomiting or diarrhea. He denies any chest pain or abdominal pain. Denies any headache or fever. He is now being admitted for further evaluation and treatment.    ALLERGIES:   Allergies  Allergen Reactions  . Codeine Hives    PAST MEDICAL HISTORY: Past Medical History  Diagnosis Date  . Sickle cell disease, type SS   . Asthma   . Renal disorder     end stage renal failure  . Stroke 2000    no deficits  . Pica   . Anemia   . Hyperparathyroidism   . Polysubstance abuse   . CHF (congestive heart failure)   . ESRD on dialysis 03/07/2013    Patient has ESRD most likely from sickle cell disease.  He is dialyzed at [facility] in [place], [place]. His renal doctor's are from [facility].  He has a LUA AVF which was first used early June 2014 and has a tunneled HD cath which is supposed to be removed soon.  He started dialysis in July 2013. HD schedule is TTS, 4hrs, dry weight 136 lbs as of June 2014.       PAST SURGICAL HISTORY: Past Surgical History  Procedure Laterality Date  . Cholecystectomy    . Renal biopsy      MEDICATIONS AT HOME: Prior to Admission medications   Medication Sig Start Date End Date Taking? Authorizing Provider  cinacalcet (SENSIPAR) 30 MG tablet Take 30 mg by mouth daily.   Yes Historical Provider, MD  oxyCODONE-acetaminophen (PERCOCET/ROXICET) 5-325 MG per tablet Take 1-2 tablets by mouth every 6 (six) hours as needed for pain. 11/15/12  Yes Suzi Roots, MD  sevelamer carbonate (RENVELA) 800 MG tablet Take 800 mg by mouth 3 (three) times daily with meals.   Yes Historical Provider, MD    FAMILY HISTORY: Family History  Problem Relation Age of Onset  . Diabetes Sister     SOCIAL HISTORY:  reports that he has never smoked. He has never used smokeless tobacco. He reports that  drinks alcohol. He reports that he uses illicit drugs (Marijuana).  REVIEW OF SYSTEMS:  Constitutional:   No  weight loss, night sweats,  Fevers, chills, fatigue.  HEENT:    No headaches, Difficulty swallowing,Tooth/dental problems,Sore throat,  No sneezing, itching, ear ache, nasal congestion, post nasal drip,   Cardio-vascular: No chest pain,  Orthopnea, PND, swelling in lower extremities, anasarca, dizziness, palpitations  GI:  No heartburn, indigestion, abdominal pain, nausea, vomiting, diarrhea, change in  bowel habits, loss of appetite  Resp: No shortness of breath with exertion or at rest.  No excess mucus, no productive cough, No non-productive cough,  No coughing up of blood.No change in color of mucus.No wheezing.No chest wall deformity  Skin:  no rash or lesions.  GU:  no dysuria, change in color of urine, no urgency or frequency.  No flank pain.  Musculoskeletal: No joint pain or swelling.  No decreased range of motion.    Psych: No change in mood or affect. No depression or anxiety.  No memory loss.   PHYSICAL EXAM: Blood pressure 132/88,  pulse 76, temperature 97.8 F (36.6 C), temperature source Oral, resp. rate 17, height 5\' 11"  (1.803 m), weight 62.143 kg (137 lb), SpO2 99.00%.  General appearance :Awake, alert, not in any distress. Speech Clear. Not toxic Looking HEENT: Atraumatic and Normocephalic, pupils equally reactive to light and accomodation Neck: supple, no JVD. No cervical lymphadenopathy.  Chest:Good air entry bilaterally, no added sounds  CVS: S1 S2 regular, no murmurs.  Abdomen: Bowel sounds present, Non tender and not distended with no gaurding, rigidity or rebound. Extremities: B/L Lower Ext shows no edema, both legs are warm to touch Neurology: Awake alert, and oriented X 3, CN II-XII intact, Non focal Skin:No Rash Wounds:N/A  LABS ON ADMISSION:   Recent Labs  03/07/13 0818  NA 141  K 4.0  CL 105  CO2 26  GLUCOSE 92  BUN 37*  CREATININE 5.16*  CALCIUM 8.6    Recent Labs  03/07/13 0818  AST 45*  ALT 14  ALKPHOS 80  BILITOT 5.8*  PROT 6.7  ALBUMIN 3.1*   No results found for this basename: LIPASE, AMYLASE,  in the last 72 hours  Recent Labs  03/07/13 0818  WBC 12.1*  NEUTROABS 7.9*  HGB 5.2*  HCT 14.5*  MCV 87.3  PLT 217   No results found for this basename: CKTOTAL, CKMB, CKMBINDEX, TROPONINI,  in the last 72 hours No results found for this basename: DDIMER,  in the last 72 hours No components found with this basename: POCBNP,    RADIOLOGIC STUDIES ON ADMISSION: No results found.    ASSESSMENT AND PLAN: Present on Admission: . Sickle cell crisis - Pain consistent with sickle cell painful crisis - Given significant anemia, will transfuse 2 units of PRBC - Unfortunately given end-stage renal disease will give him IV fluids, but clinically looks euvolemic - As needed Percocet for moderate pain and Dilaudid for severe pain.  Marland Kitchen ESRD on dialysis - Nephrology has been consulted, and they plan to dialyze him today.   . Sickle cell disease - Has a long-standing history  of sickle cell disease.  - Transfusing 2 units as above, monitor H&H and other parameters.    Further plan will depend as patient's clinical course evolves and further radiologic and laboratory data become available. Patient will be monitored closely.  DVT Prophylaxis: Prophylactic Heparin  Code Status: Full Code  Total time spent for admission equals 45 minutes.  [facility] Pager [phone]  If 7PM-7AM, please contact night-coverage [email or web address] Password [facility] 03/07/2013, 2:27 PM

## 2013-03-07 NOTE — ED Notes (Signed)
Attempted to call report. Refused to take. Was told the charge nurse would call me back.

## 2013-03-07 NOTE — ED Notes (Addendum)
Pt arrived [facility] from Dialysis today for pain in bilateral flanks and lower back that started about 20 min into dialysis. Pt only completed 1 hour of dialysis today. Goes to dialysis Tues, Thurs, Sat. Pt denies fever, SOB.

## 2013-03-07 NOTE — ED Notes (Signed)
Nephrology consult at bedside.

## 2013-03-07 NOTE — ED Notes (Signed)
MD at bedside. Dr. Yelverton. 

## 2013-03-07 NOTE — Consult Note (Signed)
Philip Rios 03/07/2013 Philip Rios D Requesting Physician:  Dr Ranae Palms, ER at [facility]  Reason for Consult:  ESRD pt with sickle crisis and anemia HPI: The patient is a 27 y.o. year-old with sickle cell disease and ESRD on dialysis in [facility], followed by [facility] nephrology.  Had low Hgb at HD and then developed pain in bilat flanks today. He rec'd about 1 hour of dialysis then went to ED.  Currently getting prbc's for Hb of 5.5 and pain is a little better after 3 mg of dilaudid. No other c/o's.   Has been using AVF for first time about 1-2 weeks ago.  HD cath is supposed to come out soon.  ROS  no fever  no HA  no cough  no n/v/d  no abd pain  + makes urine , no voiding difficulty   Past Medical History:  Past Medical History  Diagnosis Date  . Sickle cell disease, type SS   . Asthma   . Renal disorder     end stage renal failure  . Stroke 2000    no deficits  . Pica   . Anemia   . Hyperparathyroidism   . Polysubstance abuse   . CHF (congestive heart failure)   . ESRD on dialysis 03/07/2013    Patient has ESRD most likely from sickle cell disease.  He is dialyzed at [facility] in [place], [place]. His renal doctor's are from [facility].  He has a LUA AVF which was first used early June 2014 and has a tunneled HD cath which is supposed to be removed soon.  He started dialysis in July 2013. HD schedule is TTS, 4hrs, dry weight 136 lbs as of June 2014.      Past Surgical History:  Past Surgical History  Procedure Laterality Date  . Cholecystectomy    . Renal biopsy      Family History:  Family History  Problem Relation Age of Onset  . Diabetes Sister    Social History:  reports that he has never smoked. He has never used smokeless tobacco. He reports that  drinks alcohol. He reports that he uses illicit drugs (Marijuana).  Allergies:  Allergies  Allergen Reactions  . Codeine Hives    Home medications: Prior to Admission medications   Medication Sig Start Date  End Date Taking? Authorizing Provider  cinacalcet (SENSIPAR) 30 MG tablet Take 30 mg by mouth daily.   Yes Historical Provider, MD  oxyCODONE-acetaminophen (PERCOCET/ROXICET) 5-325 MG per tablet Take 1-2 tablets by mouth every 6 (six) hours as needed for pain. 11/15/12  Yes Suzi Roots, MD  sevelamer carbonate (RENVELA) 800 MG tablet Take 800 mg by mouth 3 (three) times daily with meals.   Yes Historical Provider, MD    Labs: Liver Function Tests:  Recent Labs Lab 03/07/13 0818  AST 45*  ALT 14  ALKPHOS 80  BILITOT 5.8*  PROT 6.7  ALBUMIN 3.1*   No results found for this basename: LIPASE, AMYLASE,  in the last 168 hours PT/INR: @LABRCNTIP (inr:5) Cardiac Enzymes: )No results found for this basename: TROPONINI,  in the last 168 hours CBG: No results found for this basename: GLUCAP,  in the last 168 hours CBC  Recent Labs Lab 03/07/13 0818  WBC 12.1*  NEUTROABS 7.9*  HGB 5.2*  HCT 14.5*  MCV 87.3  PLT 217   Basic Metabolic Panel:  Recent Labs Lab 03/07/13 0818  NA 141  K 4.0  CL 105  CO2 26  GLUCOSE 92  BUN 37*  CREATININE 5.16*  CALCIUM 8.6    Physical Exam:  Blood pressure 146/95, pulse 90, temperature 97.8 F (36.6 C), temperature source Oral, resp. rate 19, height 5\' 11"  (1.803 m), weight 62.143 kg (137 lb), SpO2 98.00%. Gen: alert, no distress HEENT:  EOMI, sclera icteric, throat moist Neck: + JVD, no LAN Chest: occ crackles at both bases CV: regular, no rub or gallop, no carotid or femoral bruits, pedal pulses intact Abdomen: soft, scaphoid, nontender, no ascites or HSM Ext: no LE or UE edema, no joint effusion or deformity, no gangrene or ulceration Neuro: alert, Ox3, no focal deficit Access: LUA AVF +bruit, R IJ tunneled HD cath in place  Outpatient HD: [facility] TTS   4 hrs  EDW 136lb   Heparin 2200 bolus then 500/hr maint  Bath 2K/2.5CA   F180  Aranesp 120ug/wk on thursdays  Hect 6ug/hd   Impression/Plan 1. Sickle cell painful crisis- per  primary 2. Anemia, hemolytic/CKD- getting blood transfusion 3. ESRD, got short HD today.  Plan additional HD here for 2 hours today 4. HTN/volume- UF to dry weight 5. Anemia of CKD- cont aranesp q thurs  6. Secondary HPTH- cont sensipar, renvela 7. HD access- use AVF with 17ga needles   Vinson Moselle  MD [facility] [phone] pgr    [phone] cell 03/07/2013, 1:14 PM

## 2013-03-07 NOTE — Progress Notes (Signed)
Was in isolation room with pt. when report attempted to call. Room not currently ready for ED pt .Attempted to call RN  for report, was told was in pt.room and would give report soon. Awaiting report

## 2013-03-07 NOTE — ED Provider Notes (Signed)
History     CSN: [id number]  Arrival date & time 03/07/13  0745   First MD Initiated Contact with Patient 03/07/13 0800      Chief Complaint  Patient presents with  . Sickle Cell Pain Crisis    (Consider location/radiation/quality/duration/timing/severity/associated sxs/prior treatment) HPI PT with gradual onset of low back pain and buttock pain starting while on HD today. Pt completed 1 of 4 hours and had to stop due to pain. Pt states pain is similar to previous [place] pain crisis. No fever, chills, CP, SOB, cough. Pt has no focal weakness or urinary changes.  Past Medical History  Diagnosis Date  . Sickle cell disease, type SS   . Asthma   . Renal disorder     end stage renal failure  . Stroke 2000    no deficits  . Pica   . Anemia   . Hyperparathyroidism   . Polysubstance abuse   . CHF (congestive heart failure)   . ESRD on dialysis 03/07/2013    Patient has ESRD most likely from sickle cell disease.  He is dialyzed at [facility] in [place], [place]. His renal doctor's are from [facility].  He has a LUA AVF which was first used early June 2014 and has a tunneled HD cath which is supposed to be removed soon.  He started dialysis in July 2013. HD schedule is TTS, 4hrs, dry weight 136 lbs as of June 2014.      Past Surgical History  Procedure Laterality Date  . Cholecystectomy    . Renal biopsy      Family History  Problem Relation Age of Onset  . Diabetes Sister     History  Substance Use Topics  . Smoking status: Never Smoker   . Smokeless tobacco: Never Used  . Alcohol Use: 0.0 oz/week      Review of Systems  Constitutional: Negative for fever, chills and fatigue.  HENT: Negative for neck pain.   Respiratory: Negative for cough and shortness of breath.   Cardiovascular: Negative for chest pain.  Gastrointestinal: Negative for nausea, vomiting and abdominal pain.  Genitourinary: Negative for dysuria, frequency and flank pain.  Musculoskeletal: Positive for  myalgias and back pain. Negative for arthralgias.  Skin: Negative for rash and wound.  Neurological: Negative for dizziness, weakness, light-headedness, numbness and headaches.  All other systems reviewed and are negative.    Allergies  Codeine  Home Medications   Current Outpatient Rx  Name  Route  Sig  Dispense  Refill  . cinacalcet (SENSIPAR) 30 MG tablet   Oral   Take 30 mg by mouth daily.         Marland Kitchen oxyCODONE-acetaminophen (PERCOCET/ROXICET) 5-325 MG per tablet   Oral   Take 1-2 tablets by mouth every 6 (six) hours as needed for pain.         . sevelamer carbonate (RENVELA) 800 MG tablet   Oral   Take 800 mg by mouth 3 (three) times daily with meals.           BP 139/95  Pulse 77  Temp(Src) 97.8 F (36.6 C) (Oral)  Resp 16  Ht 5\' 11"  (1.803 m)  Wt 137 lb (62.143 kg)  BMI 19.12 kg/m2  SpO2 99%  Physical Exam  Nursing note and vitals reviewed. Constitutional: He is oriented to person, place, and time. He appears well-developed and well-nourished. No distress.  HENT:  Head: Normocephalic and atraumatic.  Mouth/Throat: Oropharynx is clear and moist.  Eyes: EOM  are normal. Pupils are equal, round, and reactive to light. Scleral icterus is present.  Neck: Normal range of motion. Neck supple.  Cardiovascular: Normal rate and regular rhythm.   Pulmonary/Chest: Effort normal and breath sounds normal. No respiratory distress. He has no wheezes. He has no rales.  Abdominal: Soft. Bowel sounds are normal. He exhibits no distension and no mass. There is no tenderness. There is no rebound and no guarding.  Musculoskeletal: Normal range of motion. He exhibits no edema and no tenderness.  No midline T/L tenderness.  L upper arm shunt with palp thrill  Neurological: He is alert and oriented to person, place, and time.  5/5 motor in all ext, sensation intact  Skin: Skin is warm and dry. No rash noted. No erythema.  Psychiatric: He has a normal mood and affect. His  behavior is normal.    ED Course  Procedures (including critical care time)  Labs Reviewed  CBC WITH DIFFERENTIAL - Abnormal; Notable for the following:    WBC 12.1 (*)    RBC 1.66 (*)    Hemoglobin 5.2 (*)    HCT 14.5 (*)    RDW 18.9 (*)    Eosinophils Relative 7 (*)    Neutro Abs 7.9 (*)    Eosinophils Absolute 0.8 (*)    All other components within normal limits  COMPREHENSIVE METABOLIC PANEL - Abnormal; Notable for the following:    BUN 37 (*)    Creatinine, Ser 5.16 (*)    Albumin 3.1 (*)    AST 45 (*)    Total Bilirubin 5.8 (*)    GFR calc non Af Amer 14 (*)    GFR calc Af Amer 17 (*)    All other components within normal limits  URINALYSIS, ROUTINE W REFLEX MICROSCOPIC - Abnormal; Notable for the following:    Hgb urine dipstick LARGE (*)    Protein, ur >300 (*)    All other components within normal limits  RETICULOCYTES - Abnormal; Notable for the following:    Retic Ct Pct 7.6 (*)    RBC. 1.66 (*)    All other components within normal limits  URINE MICROSCOPIC-ADD ON  TYPE AND SCREEN  PREPARE RBC (CROSSMATCH)   No results found.   1. Anemia   2. Sickle cell crisis   3. Renal failure       MDM   Pt states pain is improved with pain meds. HGB 5.2. [facility] and anemia.   Dr Arlean Hopping to see pt for possible HD.        Loren Racer, MD 03/07/13 1326

## 2013-03-08 LAB — CBC
HCT: 20.8 % — ABNORMAL LOW (ref 39.0–52.0)
MCV: 87.4 fL (ref 78.0–100.0)
RBC: 2.38 MIL/uL — ABNORMAL LOW (ref 4.22–5.81)
RDW: 18.1 % — ABNORMAL HIGH (ref 11.5–15.5)
WBC: 13.9 10*3/uL — ABNORMAL HIGH (ref 4.0–10.5)

## 2013-03-08 LAB — BASIC METABOLIC PANEL
BUN: 25 mg/dL — ABNORMAL HIGH (ref 6–23)
CO2: 27 mEq/L (ref 19–32)
Chloride: 103 mEq/L (ref 96–112)
Creatinine, Ser: 4.1 mg/dL — ABNORMAL HIGH (ref 0.50–1.35)
Glucose, Bld: 81 mg/dL (ref 70–99)

## 2013-03-08 LAB — TYPE AND SCREEN: Unit division: 0

## 2013-03-08 MED ORDER — BIOTENE DRY MOUTH MT LIQD
15.0000 mL | Freq: Two times a day (BID) | OROMUCOSAL | Status: DC
  Administered 2013-03-08 (×2): 15 mL via OROMUCOSAL

## 2013-03-08 MED ORDER — DOXERCALCIFEROL 4 MCG/2ML IV SOLN: 6.0000 ug | INTRAVENOUS | Status: DC

## 2013-03-08 NOTE — Progress Notes (Signed)
Subjective: No complaints, still having some back pain  Objective Filed Vitals:   03/07/13 1801 03/07/13 2121 03/08/13 0519 03/08/13 0921  BP: 167/101 161/103 162/88 164/95  Pulse: 85 80 84 85  Temp: 98.5 F (36.9 C) 98.5 F (36.9 C) 98.2 F (36.8 C) 98.6 F (37 C)  TempSrc: Oral Oral Oral Oral  Resp: 18 18 19 18   Height:  5\' 11"  (1.803 m)    Weight:  62.188 kg (137 lb 1.6 oz)    SpO2: 96% 100% 98% 95%    CBC:  Recent Labs Lab 03/07/13 0818 03/08/13 0405  WBC 12.1* 13.9*  NEUTROABS 7.9*  --   HGB 5.2* 7.1*  HCT 14.5* 20.8*  MCV 87.3 87.4  PLT 217 201   Basic Metabolic Panel:  Recent Labs Lab 03/07/13 0818 03/08/13 0405  NA 141 138  K 4.0 4.3  CL 105 103  CO2 26 27  GLUCOSE 92 81  BUN 37* 25*  CREATININE 5.16* 4.10*  CALCIUM 8.6 9.3    Physical Exam:  Blood pressure 164/95, pulse 85, temperature 98.6 F (37 C), temperature source Oral, resp. rate 18, height 5\' 11"  (1.803 m), weight 62.188 kg (137 lb 1.6 oz), SpO2 95.00%.  Gen: alert, no distress  HEENT: EOMI, sclera icteric, throat moist  Neck: + JVD, no LAN  Chest: occ crackles at both bases  CV: regular, no rub or gallop, no carotid or femoral bruits, pedal pulses intact  Abdomen: soft, scaphoid, nontender, no ascites or HSM  Ext: no LE or UE edema, no joint effusion or deformity, no gangrene or ulceration  Neuro: alert, Ox3, no focal deficit  Access: LUA AVF +bruit, R IJ tunneled HD cath in place   Outpatient HD: [facility] T[facility] 4 hrs EDW 136lb (=61.5kg) Heparin 2200 bolus then 500/hr maint Bath 2K/2.5CA F180 Aranesp 120ug/wk on thursdays Hect 6ug/hd   Impression/Plan  1. Sickle cell painful crisis- per primary, still in pain 2. Anemia, hemolytic/CKD- s/p prbc's x 2 on 6/7, Hb up 7.1 3. ESRD, cont HD TTS. Usual HD is at [facility] with [facility] MD's 4. HTN/volume- UF to dry weight 5. Anemia of CKD- cont aranesp q thurs  6. Secondary HPTH- cont sensipar, renvela, vit D 7. HD access- use AVF with  17ga needles    Vinson Moselle  MD [phone] pgr    [phone] cell 03/08/2013, 12:13 PM

## 2013-03-08 NOTE — Procedures (Signed)
I was present at this dialysis session. I have reviewed the session itself and made appropriate changes.   Vinson Moselle, MD [employer] 03/07/2013, 4:45 PM

## 2013-03-08 NOTE — Progress Notes (Signed)
[facility] PROGRESS NOTE  Philip Rios ZOX:[id number] DOB: 01-27-1986 DOA: 03/07/2013 PCP: Dorrene German, MD  HPI/Subjective: Has 5/10 back pain, said this is consistent with his prior sickle cell back pain  Assessment/Plan:  Sickle cell painful crisis -As mentioned above his pain is consistent with his previous sickle cell painful crisis pain. -Patient has significant anemia, 2 units of packed RBCs transfused. -Patient to be controlled with Percocet and Dilaudid as needed. -No IV fluids were given, patient has ESRD.  ESRD -On hemodialysis, nephrology is being consulted.  Sickle cell disease -With anemia, status post transfusion of 2 units of packed RBCs. -Has renal disease secondary to his sickle cell.  Code Status: Full code Family Communication: Discussed with the patient Disposition Plan: Remain inpatient, can be discharged in a.m.   Consultants:  Nephrology  Procedures:  Dialysis  Antibiotics:  None   Objective: Filed Vitals:   03/07/13 1801 03/07/13 2121 03/08/13 0519 03/08/13 0921  BP: 167/101 161/103 162/88 164/95  Pulse: 85 80 84 85  Temp: 98.5 F (36.9 C) 98.5 F (36.9 C) 98.2 F (36.8 C) 98.6 F (37 C)  TempSrc: Oral Oral Oral Oral  Resp: 18 18 19 18   Height:  5\' 11"  (1.803 m)    Weight:  62.188 kg (137 lb 1.6 oz)    SpO2: 96% 100% 98% 95%    Intake/Output Summary (Last 24 hours) at 03/08/13 1050 Last data filed at 03/08/13 0900  Gross per 24 hour  Intake    520 ml  Output    691 ml  Net   -171 ml   Filed Weights   03/07/13 1505 03/07/13 1723 03/07/13 2121  Weight: 60.4 kg (133 lb 2.5 oz) 60.7 kg (133 lb 13.1 oz) 62.188 kg (137 lb 1.6 oz)    Exam:  General: Alert and awake, oriented x3, not in any acute distress. HEENT: anicteric sclera, pupils reactive to light and accommodation, EOMI CVS: S1-S2 clear, no murmur rubs or gallops Chest: clear to auscultation bilaterally, no wheezing, rales or rhonchi Abdomen: soft  nontender, nondistended, normal bowel sounds, no organomegaly Extremities: no cyanosis, clubbing or edema noted bilaterally Neuro: Cranial nerves II-XII intact, no focal neurological deficits  Data Reviewed: Basic Metabolic Panel:  Recent Labs Lab 03/07/13 0818 03/08/13 0405  NA 141 138  K 4.0 4.3  CL 105 103  CO2 26 27  GLUCOSE 92 81  BUN 37* 25*  CREATININE 5.16* 4.10*  CALCIUM 8.6 9.3   Liver Function Tests:  Recent Labs Lab 03/07/13 0818  AST 45*  ALT 14  ALKPHOS 80  BILITOT 5.8*  PROT 6.7  ALBUMIN 3.1*   No results found for this basename: LIPASE, AMYLASE,  in the last 168 hours No results found for this basename: AMMONIA,  in the last 168 hours CBC:  Recent Labs Lab 03/07/13 0818 03/08/13 0405  WBC 12.1* 13.9*  NEUTROABS 7.9*  --   HGB 5.2* 7.1*  HCT 14.5* 20.8*  MCV 87.3 87.4  PLT 217 201   Cardiac Enzymes: No results found for this basename: CKTOTAL, CKMB, CKMBINDEX, TROPONINI,  in the last 168 hours BNP (last 3 results)  Recent Labs  03/13/12 1125 03/14/12 1700  PROBNP 9075.0* 34701.0*   CBG: No results found for this basename: GLUCAP,  in the last 168 hours  No results found for this or any previous visit (from the past 240 hour(s)).   Studies: No results found.  Scheduled Meds: . antiseptic oral rinse  15 mL Mouth Rinse BID  .  cinacalcet  30 mg Oral Q breakfast  . folic acid  1 mg Oral Daily  . heparin  2,500 Units Dialysis Once in dialysis  . heparin  5,000 Units Subcutaneous Q8H  . senna  1 tablet Oral BID  . sevelamer carbonate  800 mg Oral TID WC  . sodium chloride  3 mL Intravenous Q12H   Continuous Infusions:   Principal Problem:   Sickle cell crisis Active Problems:   Sickle cell disease   ESRD on dialysis    Time spent: 35 minutes    [facility]  [facility] Pager [phone]. If 7PM-7AM, please contact night-coverage at [email or web address], password [facility] 03/08/2013, 10:50 AM  LOS: 1 day

## 2013-03-09 LAB — CBC
MCH: 29.7 pg (ref 26.0–34.0)
Platelets: 221 10*3/uL (ref 150–400)
RBC: 2.02 MIL/uL — ABNORMAL LOW (ref 4.22–5.81)
RDW: 18.5 % — ABNORMAL HIGH (ref 11.5–15.5)
WBC: 13.2 10*3/uL — ABNORMAL HIGH (ref 4.0–10.5)

## 2013-03-09 LAB — BASIC METABOLIC PANEL
Calcium: 8.6 mg/dL (ref 8.4–10.5)
GFR calc non Af Amer: 12 mL/min — ABNORMAL LOW (ref >90)
Sodium: 137 mEq/L (ref 135–145)

## 2013-03-09 LAB — URINE CULTURE: Colony Count: NO GROWTH

## 2013-03-09 MED ORDER — POLYETHYLENE GLYCOL 3350 17 G PO PACK
17.0000 g | PACK | Freq: Two times a day (BID) | ORAL | Status: DC
  Administered 2013-03-09: 17 g via ORAL
  Filled 2013-03-09 (×2): qty 1

## 2013-03-09 MED ORDER — SODIUM CHLORIDE 0.9 % IV SOLN: 100.0000 mL | INTRAVENOUS | Status: DC | PRN

## 2013-03-09 MED ORDER — PENTAFLUOROPROP-TETRAFLUOROETH EX AERO: 1.0000 "application " | INHALATION_SPRAY | CUTANEOUS | Status: DC | PRN

## 2013-03-09 MED ORDER — HEPARIN SODIUM (PORCINE) 1000 UNIT/ML DIALYSIS
20.0000 [IU]/kg | INTRAMUSCULAR | Status: DC | PRN
  Filled 2013-03-09: qty 2

## 2013-03-09 MED ORDER — OXYCODONE-ACETAMINOPHEN 5-325 MG PO TABS: 1.0000 | ORAL_TABLET | Freq: Four times a day (QID) | ORAL | Status: DC | PRN

## 2013-03-09 MED ORDER — POLYETHYLENE GLYCOL 3350 17 G PO PACK: 17.0000 g | PACK | Freq: Two times a day (BID) | ORAL | Status: DC

## 2013-03-09 MED ORDER — HEPARIN SODIUM (PORCINE) 1000 UNIT/ML DIALYSIS
1000.0000 [IU] | INTRAMUSCULAR | Status: DC | PRN
  Filled 2013-03-09: qty 1

## 2013-03-09 MED ORDER — LIDOCAINE-PRILOCAINE 2.5-2.5 % EX CREA: 1.0000 "application " | TOPICAL_CREAM | CUTANEOUS | Status: DC | PRN

## 2013-03-09 MED ORDER — ALTEPLASE 2 MG IJ SOLR
2.0000 mg | Freq: Once | INTRAMUSCULAR | Status: DC | PRN
  Filled 2013-03-09: qty 2

## 2013-03-09 MED ORDER — SORBITOL 70 % SOLN
30.0000 mL | Freq: Once | Status: AC
  Administered 2013-03-09: 30 mL via ORAL
  Filled 2013-03-09: qty 30

## 2013-03-09 MED ORDER — FOLIC ACID 1 MG PO TABS: 1.0000 mg | ORAL_TABLET | Freq: Every day | ORAL | Status: DC

## 2013-03-09 MED ORDER — LIDOCAINE HCL (PF) 1 % IJ SOLN: 5.0000 mL | INTRAMUSCULAR | Status: DC | PRN

## 2013-03-09 MED ORDER — NEPRO/CARBSTEADY PO LIQD: 237.0000 mL | ORAL | Status: DC | PRN

## 2013-03-09 NOTE — Progress Notes (Signed)
[facility] ROUNDING NOTE   Subjective:   Interval History:no complaints  Objective:  Vital signs in last 24 hours:  Temp:  [97.9 F (36.6 C)-99.5 F (37.5 C)] 99.2 F (37.3 C) (06/09 0628) Pulse Rate:  [72-99] 72 (06/09 0628) Resp:  [18-20] 20 (06/09 0628) BP: (155-164)/(87-97) 155/92 mmHg (06/09 0628) SpO2:  [91 %-100 %] 100 % (06/09 0628) Weight:  [63 kg (138 lb 14.2 oz)] 63 kg (138 lb 14.2 oz) (06/08 2121)  Weight change: 0.857 kg (1 lb 14.2 oz) Filed Weights   03/07/13 1723 03/07/13 2121 03/08/13 2121  Weight: 60.7 kg (133 lb 13.1 oz) 62.188 kg (137 lb 1.6 oz) 63 kg (138 lb 14.2 oz)    Intake/Output: I/O last 3 completed shifts: In: 240 [P.O.:240] Out: 1025 [Urine:1025]   Intake/Output this shift:  Total I/O In: -  Out: 150 [Urine:150]  Gen: alert, no distress  HEENT: EOMI, sclera icteric, throat moist  Neck: + JVD, no LAN  Chest: occ crackles at both bases  CV: regular, no rub or gallop, no carotid or femoral bruits, pedal pulses intact  Abdomen: soft, scaphoid, nontender, no ascites or HSM  Ext: no LE or UE edema, no joint effusion or deformity, no gangrene or ulceration  Neuro: alert, Ox3, no focal deficit  Access: LUA AVF +bruit, R IJ tunneled HD cath in place     Basic Metabolic Panel:  Recent Labs Lab 03/07/13 0818 03/08/13 0405  NA 141 138  K 4.0 4.3  CL 105 103  CO2 26 27  GLUCOSE 92 81  BUN 37* 25*  CREATININE 5.16* 4.10*  CALCIUM 8.6 9.3    Liver Function Tests:  Recent Labs Lab 03/07/13 0818  AST 45*  ALT 14  ALKPHOS 80  BILITOT 5.8*  PROT 6.7  ALBUMIN 3.1*   No results found for this basename: LIPASE, AMYLASE,  in the last 168 hours No results found for this basename: AMMONIA,  in the last 168 hours  CBC:  Recent Labs Lab 03/07/13 0818 03/08/13 0405  WBC 12.1* 13.9*  NEUTROABS 7.9*  --   HGB 5.2* 7.1*  HCT 14.5* 20.8*  MCV 87.3 87.4  PLT 217 201    Cardiac Enzymes: No results found for this  basename: CKTOTAL, CKMB, CKMBINDEX, TROPONINI,  in the last 168 hours  BNP: No components found with this basename: POCBNP,   CBG: No results found for this basename: GLUCAP,  in the last 168 hours  Microbiology: Results for orders placed during the hospital encounter of 03/12/12  MRSA PCR SCREENING     Status: None   Collection Time    03/13/12  4:19 PM      Result Value Range Status   MRSA by PCR NEGATIVE  NEGATIVE Final   Comment:            The GeneXpert MRSA Assay (FDA     approved for NASAL specimens     only), is one component of a     comprehensive MRSA colonization     surveillance program. It is not     intended to diagnose MRSA     infection nor to guide or     monitor treatment for     MRSA infections.  CULTURE, BLOOD (ROUTINE X 2)     Status: None   Collection Time    03/13/12  4:45 PM      Result Value Range Status   Specimen Description BLOOD LEFT HAND   Final  Special Requests BOTTLES DRAWN AEROBIC ONLY 10CC   Final   Culture  Setup Time [id number]   Final   Culture NO GROWTH 5 DAYS   Final   Report Status 03/20/2012 FINAL   Final  CULTURE, BLOOD (ROUTINE X 2)     Status: None   Collection Time    03/13/12  4:53 PM      Result Value Range Status   Specimen Description BLOOD LEFT HAND   Final   Special Requests BOTTLES DRAWN AEROBIC AND ANAEROBIC 10CC   Final   Culture  Setup Time [id number]   Final   Culture NO GROWTH 5 DAYS   Final   Report Status 03/20/2012 FINAL   Final  URINE CULTURE     Status: None   Collection Time    03/14/12  3:30 PM      Result Value Range Status   Specimen Description URINE, CATHETERIZED   Final   Special Requests NONE   Final   Culture  Setup Time [id number]   Final   Colony Count NO GROWTH   Final   Culture NO GROWTH   Final   Report Status 03/16/2012 FINAL   Final    Coagulation Studies: No results found for this basename: LABPROT, INR,  in the last 72 hours  Urinalysis:  Recent Labs  03/07/13 0842   COLORURINE YELLOW  LABSPEC 1.011  PHURINE 8.0  GLUCOSEU NEGATIVE  HGBUR LARGE*  BILIRUBINUR NEGATIVE  KETONESUR NEGATIVE  PROTEINUR >300*  UROBILINOGEN 1.0  NITRITE NEGATIVE  LEUKOCYTESUR NEGATIVE      Imaging: No results found.   Medications:     . antiseptic oral rinse  15 mL Mouth Rinse BID  . cinacalcet  30 mg Oral Q breakfast  . [START ON 03/10/2013] doxercalciferol  6 mcg Intravenous Q T,Th,Sa-HD  . folic acid  1 mg Oral Daily  . heparin  2,500 Units Dialysis Once in dialysis  . heparin  5,000 Units Subcutaneous Q8H  . polyethylene glycol  17 g Oral BID  . senna  1 tablet Oral BID  . sevelamer carbonate  800 mg Oral TID WC  . sodium chloride  3 mL Intravenous Q12H  . sorbitol  30 mL Oral Once   sodium chloride, sodium chloride, sodium chloride, acetaminophen, acetaminophen, albuterol, diphenhydrAMINE, feeding supplement (NEPRO CARB STEADY), heparin, HYDROmorphone (DILAUDID) injection, lidocaine (PF), lidocaine-prilocaine, ondansetron (ZOFRAN) IV, ondansetron, oxyCODONE-acetaminophen, pentafluoroprop-tetrafluoroeth, sodium chloride  Assessment/ Plan:   1. Sickle cell painful crisis- per primary, improved 2. Anemia, hemolytic/CKD- s/p prbc's x 2 on 6/7, Hb up 7.1 3. ESRD, cont HD TTS. Usual HD is at [facility] with [facility] MD's 4. HTN/volume- UF to dry weight 5. Anemia of CKD- cont aranesp q thurs  6. Secondary HPTH- cont sensipar, renvela, vit D 7. HD access- use AVF with 17ga needles        LOS: 2 Philip Rios W @TODAY @9 :05 AM

## 2013-03-09 NOTE — Progress Notes (Signed)
UR COMPLETED  

## 2013-03-09 NOTE — Progress Notes (Signed)
Patient discharged to home. Patient AVS reviewed and prescription given to patient. Patient verbalized understanding of medications and follow-up appointments.  Patient remains stable; no signs or symptoms of distress.  Patient educated to return to the ER in cases of recurrence of admitting symptoms, SOB, dizziness, fever, chest pain, or fainting.

## 2013-03-09 NOTE — Discharge Summary (Signed)
Physician Discharge Summary  Philip Rios ZOX:[id number] DOB: Aug 28, 1986 DOA: 03/07/2013  PCP: Dorrene German, MD  Admit date: 03/07/2013 Discharge date: 03/09/2013  Time spent: 60 minutes  Recommendations for Outpatient Follow-up:  1. Followup with PCP one week post discharge. Patient may benefit from referral to a hematologist as outpatient. Patient will need a CBC done to followup on his hemoglobin. 2. Patient is to followup at [facility] for his scheduled dialysis on Tuesday Thursdays and Saturdays.  Discharge Diagnoses:  Principal Problem:   Sickle cell crisis Active Problems:   Sickle cell disease   ESRD on dialysis   Discharge Condition: Stable and improved  Diet recommendation: Renal diet.  Filed Weights   03/07/13 1723 03/07/13 2121 03/08/13 2121  Weight: 60.7 kg (133 lb 13.1 oz) 62.188 kg (137 lb 1.6 oz) 63 kg (138 lb 14.2 oz)    History of present illness:  Philip Rios is a 27 y.o. male with a Past Medical History of sickle cell disease and end-stage renal disease on hemodialysis who presents today with the above noted complaint. The patient he was in his usual state of health till yesterday, today while at dialysis he started developing low back pain that gradually became intense. Since the pain started worsening, patient could only completed one hour off his usual dialysis, dialysis was stopped and the patient was referred to the emergency room for further evaluation. In the ED, patient was found to have a hemoglobin of 5.2, I was subsequently asked to admit this patient for further evaluation and treatment.  During my evaluation, patient seemed comfortable, his only complaint was his pain in the low back area which feels like his typical Pain off a sickle cell crisis. He denies vomiting or diarrhea. He denies any chest pain or abdominal pain. Denies any headache or fever. He is now being admitted for further evaluation and treatment   Hospital Course:  #1 acute  sickle cell crisis Patient had presented with back pain consistent to prior sickle cell crises. On admission patient was noted to have a hemoglobin of 5.2. Patient was transfused 2 units of packed red blood cells and followed. Patient was placed on pain management with IV Dilantin and oral Percocet. Patient was also placed on folic acid. Patient was not placed on IV fluids secondary to his history of end-stage renal disease on hemodialysis. Patient improved clinically during the hospitalization such that by day of discharge patient was in stable and improved condition. Patient will be discharged home on oral Percocet as needed for his pain. Patient is to followup with PCP one week post discharge. Patient will likely benefit from referral to a hematologist for further management of his sickle cell disease.  #2 end-stage renal disease on hemodialysis Patient was noted to have a history of end-stage renal disease on hemodialysis blood present an acute sickle cell crisis. At a nephrology consultation was obtained and patient was seen in consultation by Dr. Arlean Hopping. Patient received 2 units of packed red blood cells during this hospitalization and also received hemodialysis while she was here. Patient was maintained on iron as as well as his Sensipar and renvela. Patient remained in stable condition and will followup at his outpatient dialysis center at [facility] with his [facility] M.D.  The rest of patient's chronic medical issues remained stable throughout the hospitalization patient be discharged in stable and improved condition.  Procedures:  Hemodialysis during this hospitalization  Consultations:  Nephrology: Dr. Arlean Hopping 03/07/2013  Discharge Exam: Ceasar Mons Vitals:  03/08/13 1440 03/08/13 2121 03/09/13 0628 03/09/13 1001  BP: 155/87 159/97 155/92 150/90  Pulse: 93 99 72 93  Temp: 97.9 F (36.6 C) 99.5 F (37.5 C) 99.2 F (37.3 C) 98.2 F (36.8 C)  TempSrc: Oral Oral Oral Oral  Resp: 18  20 20 18   Height:  5\' 11"  (1.803 m)    Weight:  63 kg (138 lb 14.2 oz)    SpO2: 94% 91% 100% 92%    General: NAD Cardiovascular: RRR Respiratory: CTAB  Discharge Instructions  Discharge Orders   Future Orders Complete By Expires     Diet general  As directed     Comments:      Renal diet.    Discharge instructions  As directed     Comments:      Follow up with AVBUERE,EDWIN A, MD in 1 week. Follow up at dialysis center tomorrow as scheduled.    Increase activity slowly  As directed         Medication List    TAKE these medications       cinacalcet 30 MG tablet  Commonly known as:  SENSIPAR  Take 30 mg by mouth daily.     folic acid 1 MG tablet  Commonly known as:  FOLVITE  Take 1 tablet (1 mg total) by mouth daily.     oxyCODONE-acetaminophen 5-325 MG per tablet  Commonly known as:  PERCOCET/ROXICET  Take 1-2 tablets by mouth every 6 (six) hours as needed for pain.     polyethylene glycol packet  Commonly known as:  MIRALAX / GLYCOLAX  Take 17 g by mouth 2 (two) times daily. Hold if you develop diarrhea.     sevelamer carbonate 800 MG tablet  Commonly known as:  RENVELA  Take 800 mg by mouth 3 (three) times daily with meals.       Allergies  Allergen Reactions  . Codeine Hives       Follow-up Information   Follow up with AVBUERE,EDWIN A, MD. Schedule an appointment as soon as possible for a visit in 1 week.   Contact information:   [address] [place] [address] [phone]       Follow up On 03/10/2013. (f/u at HD center as scheduled.)        The results of significant diagnostics from this hospitalization (including imaging, microbiology, ancillary and laboratory) are listed below for reference.    Significant Diagnostic Studies: No results found.  Microbiology: No results found for this or any previous visit (from the past 240 hour(s)).   Labs: Basic Metabolic Panel:  Recent Labs Lab 03/07/13 0818 03/08/13 0405  03/09/13 0829  NA 141 138 137  K 4.0 4.3 4.2  CL 105 103 102  CO2 26 27 26   GLUCOSE 92 81 101*  BUN 37* 25* 41*  CREATININE 5.16* 4.10* 5.78*  CALCIUM 8.6 9.3 8.6   Liver Function Tests:  Recent Labs Lab 03/07/13 0818  AST 45*  ALT 14  ALKPHOS 80  BILITOT 5.8*  PROT 6.7  ALBUMIN 3.1*   No results found for this basename: LIPASE, AMYLASE,  in the last 168 hours No results found for this basename: AMMONIA,  in the last 168 hours CBC:  Recent Labs Lab 03/07/13 0818 03/08/13 0405 03/09/13 0829  WBC 12.1* 13.9* 13.2*  NEUTROABS 7.9*  --   --   HGB 5.2* 7.1* 6.0*  HCT 14.5* 20.8* 17.6*  MCV 87.3 87.4 87.1  PLT 217 201 221   Cardiac  Enzymes: No results found for this basename: CKTOTAL, CKMB, CKMBINDEX, TROPONINI,  in the last 168 hours BNP: BNP (last 3 results)  Recent Labs  03/13/12 1125 03/14/12 1700  PROBNP 9075.0* 34701.0*   CBG: No results found for this basename: GLUCAP,  in the last 168 hours     Signed:  Brissa Asante  [facility] 03/09/2013, 1:07 PM

## 2013-03-09 NOTE — Progress Notes (Signed)
  CRITICAL VALUE ALERT  Critical value received:  Hemoglobin 6. Date of notification:  03/09/13  Time of notification:  09:22  Critical value read back:yes  Nurse who received alert:  Fayne Norrie  MD notified (1st page):  Dr. Algis Downs. Thomsonpaged @ 09:25. M.D. Responded to page @ 09:26. No new orders given at this time. Will continue to monitor patient.

## 2013-03-21 ENCOUNTER — Encounter (HOSPITAL_COMMUNITY): Payer: Self-pay | Admitting: Physical Medicine and Rehabilitation

## 2013-03-21 ENCOUNTER — Observation Stay (HOSPITAL_COMMUNITY): Payer: Federal, State, Local not specified - PPO

## 2013-03-21 ENCOUNTER — Inpatient Hospital Stay (HOSPITAL_COMMUNITY)
Admission: EM | Admit: 2013-03-21 | Discharge: 2013-03-22 | DRG: 395 | Disposition: A | Discharge status: Home / Self Care | Payer: Federal, State, Local not specified - PPO | Attending: Internal Medicine | Admitting: Internal Medicine

## 2013-03-21 DIAGNOSIS — M79672 Pain in left foot: Secondary | ICD-10-CM

## 2013-03-21 DIAGNOSIS — M879 Osteonecrosis, unspecified: Secondary | ICD-10-CM

## 2013-03-21 DIAGNOSIS — D631 Anemia in chronic kidney disease: Secondary | ICD-10-CM | POA: Diagnosis present

## 2013-03-21 DIAGNOSIS — E875 Hyperkalemia: Secondary | ICD-10-CM

## 2013-03-21 DIAGNOSIS — F1290 Cannabis use, unspecified, uncomplicated: Secondary | ICD-10-CM

## 2013-03-21 DIAGNOSIS — R0902 Hypoxemia: Secondary | ICD-10-CM

## 2013-03-21 DIAGNOSIS — D5700 Hb-SS disease with crisis, unspecified: Principal | ICD-10-CM

## 2013-03-21 DIAGNOSIS — R109 Unspecified abdominal pain: Secondary | ICD-10-CM

## 2013-03-21 DIAGNOSIS — I120 Hypertensive chronic kidney disease with stage 5 chronic kidney disease or end stage renal disease: Secondary | ICD-10-CM | POA: Diagnosis present

## 2013-03-21 DIAGNOSIS — N186 End stage renal disease: Secondary | ICD-10-CM

## 2013-03-21 DIAGNOSIS — J45909 Unspecified asthma, uncomplicated: Secondary | ICD-10-CM | POA: Diagnosis present

## 2013-03-21 DIAGNOSIS — N2581 Secondary hyperparathyroidism of renal origin: Secondary | ICD-10-CM | POA: Diagnosis present

## 2013-03-21 DIAGNOSIS — Z992 Dependence on renal dialysis: Secondary | ICD-10-CM

## 2013-03-21 DIAGNOSIS — Z8673 Personal history of transient ischemic attack (TIA), and cerebral infarction without residual deficits: Secondary | ICD-10-CM

## 2013-03-21 DIAGNOSIS — R7989 Other specified abnormal findings of blood chemistry: Secondary | ICD-10-CM

## 2013-03-21 DIAGNOSIS — F1210 Cannabis abuse, uncomplicated: Secondary | ICD-10-CM | POA: Diagnosis present

## 2013-03-21 DIAGNOSIS — I509 Heart failure, unspecified: Secondary | ICD-10-CM | POA: Diagnosis present

## 2013-03-21 HISTORY — DX: Hemochromatosis due to repeated red blood cell transfusions: E83.111

## 2013-03-21 LAB — CBC WITH DIFFERENTIAL/PLATELET
Basophils Absolute: 0 10*3/uL (ref 0.0–0.1)
Basophils Absolute: 0 10*3/uL (ref 0.0–0.1)
Basophils Relative: 0 % (ref 0–1)
Eosinophils Absolute: 1.1 10*3/uL — ABNORMAL HIGH (ref 0.0–0.7)
Eosinophils Absolute: 1 10*3/uL — ABNORMAL HIGH (ref 0.0–0.7)
Eosinophils Relative: 8 % — ABNORMAL HIGH (ref 0–5)
HCT: 17.9 % — ABNORMAL LOW (ref 39.0–52.0)
Lymphocytes Relative: 23 % (ref 12–46)
MCH: 30.7 pg (ref 26.0–34.0)
MCH: 30.4 pg (ref 26.0–34.0)
MCHC: 35.1 g/dL (ref 30.0–36.0)
MCHC: 34.6 g/dL (ref 30.0–36.0)
MCV: 87.7 fL (ref 78.0–100.0)
Monocytes Absolute: 0.9 10*3/uL (ref 0.1–1.0)
Monocytes Absolute: 1 10*3/uL (ref 0.1–1.0)
Neutrophils Relative %: 61 % (ref 43–77)
Platelets: 273 10*3/uL (ref 150–400)
Platelets: 277 10*3/uL (ref 150–400)
RDW: 17.9 % — ABNORMAL HIGH (ref 11.5–15.5)
RDW: 17.8 % — ABNORMAL HIGH (ref 11.5–15.5)
WBC: 11.9 10*3/uL — ABNORMAL HIGH (ref 4.0–10.5)

## 2013-03-21 LAB — COMPREHENSIVE METABOLIC PANEL
ALT: 25 U/L (ref 0–53)
AST: 83 U/L — ABNORMAL HIGH (ref 0–37)
Albumin: 3.1 g/dL — ABNORMAL LOW (ref 3.5–5.2)
Calcium: 8.8 mg/dL (ref 8.4–10.5)
GFR calc Af Amer: 18 mL/min — ABNORMAL LOW (ref >90)
Glucose, Bld: 87 mg/dL (ref 70–99)
Potassium: 3.7 mEq/L (ref 3.5–5.1)
Sodium: 139 mEq/L (ref 135–145)
Total Protein: 7.3 g/dL (ref 6.0–8.3)

## 2013-03-21 LAB — CREATININE, SERUM: Creatinine, Ser: 5.09 mg/dL — ABNORMAL HIGH (ref 0.50–1.35)

## 2013-03-21 LAB — RETICULOCYTES
Retic Count, Absolute: 105.6 10*3/uL (ref 19.0–186.0)
Retic Ct Pct: 5 % — ABNORMAL HIGH (ref 0.4–3.1)

## 2013-03-21 MED ORDER — FOLIC ACID 1 MG PO TABS: 1.0000 mg | ORAL_TABLET | Freq: Every day | ORAL | Status: DC

## 2013-03-21 MED ORDER — DOXERCALCIFEROL 4 MCG/2ML IV SOLN
6.0000 ug | INTRAVENOUS | Status: DC
  Administered 2013-03-21: 6 ug via INTRAVENOUS
  Filled 2013-03-21: qty 4

## 2013-03-21 MED ORDER — SODIUM CHLORIDE 0.9 % IV SOLN: 100.0000 mL | INTRAVENOUS | Status: DC | PRN

## 2013-03-21 MED ORDER — CINACALCET HCL 30 MG PO TABS
30.0000 mg | ORAL_TABLET | Freq: Every day | ORAL | Status: DC
  Administered 2013-03-21 – 2013-03-22 (×2): 30 mg via ORAL
  Filled 2013-03-21 (×2): qty 1

## 2013-03-21 MED ORDER — PENTAFLUOROPROP-TETRAFLUOROETH EX AERO: 1.0000 "application " | INHALATION_SPRAY | CUTANEOUS | Status: DC | PRN

## 2013-03-21 MED ORDER — HYDROMORPHONE HCL PF 2 MG/ML IJ SOLN
2.0000 mg | Freq: Once | INTRAMUSCULAR | Status: AC
  Administered 2013-03-21: 2 mg via INTRAVENOUS
  Filled 2013-03-21: qty 1

## 2013-03-21 MED ORDER — OXYCODONE-ACETAMINOPHEN 5-325 MG PO TABS
1.0000 | ORAL_TABLET | ORAL | Status: DC | PRN
  Administered 2013-03-21 – 2013-03-22 (×2): 2 via ORAL
  Filled 2013-03-21: qty 2

## 2013-03-21 MED ORDER — LIDOCAINE-PRILOCAINE 2.5-2.5 % EX CREA: 1.0000 "application " | TOPICAL_CREAM | CUTANEOUS | Status: DC | PRN

## 2013-03-21 MED ORDER — NEPRO/CARBSTEADY PO LIQD: 237.0000 mL | ORAL | Status: DC | PRN

## 2013-03-21 MED ORDER — HEPARIN SODIUM (PORCINE) 1000 UNIT/ML DIALYSIS: 1000.0000 [IU] | INTRAMUSCULAR | Status: DC | PRN

## 2013-03-21 MED ORDER — ALTEPLASE 2 MG IJ SOLR: 2.0000 mg | Freq: Once | INTRAMUSCULAR | Status: DC | PRN

## 2013-03-21 MED ORDER — HYDROMORPHONE HCL PF 1 MG/ML IJ SOLN
INTRAMUSCULAR | Status: AC
  Filled 2013-03-21: qty 1

## 2013-03-21 MED ORDER — DIPHENHYDRAMINE HCL 50 MG/ML IJ SOLN: 12.5000 mg | INTRAMUSCULAR | Status: DC | PRN

## 2013-03-21 MED ORDER — SODIUM CHLORIDE 0.9 % IV BOLUS (SEPSIS): 1000.0000 mL | Freq: Once | INTRAVENOUS | Status: DC

## 2013-03-21 MED ORDER — MORPHINE SULFATE 2 MG/ML IJ SOLN
1.0000 mg | INTRAMUSCULAR | Status: DC | PRN
  Administered 2013-03-21: 1 mg via INTRAVENOUS
  Filled 2013-03-21: qty 1

## 2013-03-21 MED ORDER — DOXERCALCIFEROL 4 MCG/2ML IV SOLN
INTRAVENOUS | Status: AC
  Filled 2013-03-21: qty 4

## 2013-03-21 MED ORDER — FOLIC ACID 1 MG PO TABS
1.0000 mg | ORAL_TABLET | Freq: Every day | ORAL | Status: DC
  Administered 2013-03-21 – 2013-03-22 (×2): 1 mg via ORAL
  Filled 2013-03-21 (×2): qty 1

## 2013-03-21 MED ORDER — HEPARIN SODIUM (PORCINE) 1000 UNIT/ML DIALYSIS
2500.0000 [IU] | INTRAMUSCULAR | Status: DC | PRN
  Administered 2013-03-21: 2500 [IU] via INTRAVENOUS_CENTRAL
  Filled 2013-03-21: qty 3

## 2013-03-21 MED ORDER — HYDROMORPHONE HCL PF 2 MG/ML IJ SOLN: 2.0000 mg | Freq: Once | INTRAMUSCULAR | Status: DC

## 2013-03-21 MED ORDER — DIPHENHYDRAMINE HCL 25 MG PO CAPS
25.0000 mg | ORAL_CAPSULE | ORAL | Status: DC | PRN
  Administered 2013-03-21: 25 mg via ORAL
  Filled 2013-03-21: qty 1

## 2013-03-21 MED ORDER — OXYCODONE-ACETAMINOPHEN 5-325 MG PO TABS
ORAL_TABLET | ORAL | Status: AC
  Filled 2013-03-21: qty 2

## 2013-03-21 MED ORDER — HYDROMORPHONE HCL PF 1 MG/ML IJ SOLN
1.0000 mg | INTRAMUSCULAR | Status: DC | PRN
  Administered 2013-03-21 – 2013-03-22 (×4): 1 mg via INTRAVENOUS
  Filled 2013-03-21 (×3): qty 1

## 2013-03-21 MED ORDER — SEVELAMER CARBONATE 800 MG PO TABS
800.0000 mg | ORAL_TABLET | Freq: Three times a day (TID) | ORAL | Status: DC
  Administered 2013-03-21 – 2013-03-22 (×3): 800 mg via ORAL
  Filled 2013-03-21 (×5): qty 1

## 2013-03-21 MED ORDER — ONDANSETRON HCL 4 MG/2ML IJ SOLN: 4.0000 mg | INTRAMUSCULAR | Status: DC | PRN

## 2013-03-21 MED ORDER — HEPARIN SODIUM (PORCINE) 5000 UNIT/ML IJ SOLN
5000.0000 [IU] | Freq: Three times a day (TID) | INTRAMUSCULAR | Status: DC
  Administered 2013-03-21: 5000 [IU] via SUBCUTANEOUS
  Filled 2013-03-21 (×6): qty 1

## 2013-03-21 MED ORDER — ONDANSETRON HCL 4 MG PO TABS: 4.0000 mg | ORAL_TABLET | ORAL | Status: DC | PRN

## 2013-03-21 MED ORDER — LIDOCAINE HCL (PF) 1 % IJ SOLN: 5.0000 mL | INTRAMUSCULAR | Status: DC | PRN

## 2013-03-21 NOTE — H&P (Signed)
PATIENT DETAILS Name: Philip Rios Age: 27 y.o. Sex: male Date of Birth: 04-17-1986 Admit Date: 03/21/2013 AVW:UJWJXBJ,YNWGN A, MD   CHIEF COMPLAINT:  Generalized body aches-but mostly in the lower back area  HPI: Philip Rios is a 27 y.o. male with a Past Medical History of sickle cell disease, end-stage renal disease on hemodialysis who presents today with the above noted complaint. Per patient, he was in his usual state of health until this morning, he claims that this when he went to hemodialysis this morning, he started having generalized body aches, but mostly in his low back area. He feels that the pain is similar to his usual sickle cell crises. He unfortunately only completed one hour of dialysis and was sent to the ED for further evaluation and treatment. He denies any fever, cough or shortness of breath. He also denies any chest pain. He also denies any nausea, vomiting or diarrhea. He also denies any abdominal pain. In the emergency room, he was found to have a hemoglobin of 5.9, I was asked to admit this patient for further evaluation and treatment. During my evaluation, patient claims that his back pain was already better, he seems very comfortable and did not have any major complaints-except noted above.  ALLERGIES:   Allergies  Allergen Reactions  . Codeine Hives    PAST MEDICAL HISTORY: Past Medical History  Diagnosis Date  . Sickle cell disease, type SS   . Asthma   . Renal disorder     end stage renal failure  . Stroke 2000    no deficits  . Pica   . Anemia   . Hyperparathyroidism   . Polysubstance abuse   . CHF (congestive heart failure)   . ESRD on dialysis 03/07/2013    Patient has ESRD most likely from sickle cell disease.  He is dialyzed at [facility] in [place], [place]. His renal doctor's are from [facility].  He has a LUA AVF which was first used early June 2014 and has a tunneled HD cath which is supposed to be removed soon.  He started dialysis in  July 2013. HD schedule is TTS, 4hrs, dry weight 136 lbs as of June 2014.      PAST SURGICAL HISTORY: Past Surgical History  Procedure Laterality Date  . Cholecystectomy    . Renal biopsy    . Av fistula placement      MEDICATIONS AT HOME: Prior to Admission medications   Medication Sig Start Date End Date Taking? Authorizing Provider  cinacalcet (SENSIPAR) 30 MG tablet Take 30 mg by mouth daily.   Yes Historical Provider, MD  folic acid (FOLVITE) 1 MG tablet Take 1 tablet (1 mg total) by mouth daily. 03/09/13  Yes Rodolph Bong, MD  oxyCODONE-acetaminophen (PERCOCET/ROXICET) 5-325 MG per tablet Take 1-2 tablets by mouth every 6 (six) hours as needed for pain. 03/09/13  Yes Rodolph Bong, MD  sevelamer carbonate (RENVELA) 800 MG tablet Take 800 mg by mouth 3 (three) times daily with meals.   Yes Historical Provider, MD    FAMILY HISTORY: Family History  Problem Relation Age of Onset  . Diabetes Sister   . Other Mother   . Other Father     SOCIAL HISTORY:  reports that he has never smoked. He has never used smokeless tobacco. He reports that he uses illicit drugs (Marijuana) about 7 times per week. He reports that he does not drink alcohol.  REVIEW OF SYSTEMS:  Constitutional:   No  weight  loss, night sweats,  Fevers, chills, fatigue.  HEENT:    No headaches, Difficulty swallowing,Tooth/dental problems,Sore throat,  No sneezing, itching, ear ache, nasal congestion, post nasal drip,   Cardio-vascular: No chest pain,  Orthopnea, PND, swelling in lower extremities, anasarca,  dizziness, palpitations  GI:  No heartburn, indigestion, abdominal pain, nausea, vomiting, diarrhea, change in  bowel habits, loss of appetite  Resp: No shortness of breath with exertion or at rest.  No excess mucus, no productive cough, No non-productive cough,  No coughing up of blood.No change in color of mucus.No wheezing.No chest wall deformity  Skin:  no rash or lesions.  GU:  no dysuria,  change in color of urine, no urgency or frequency.  No flank pain.  Musculoskeletal: No joint pain or swelling.  No decreased range of motion.  No back pain.  Psych: No change in mood or affect. No depression or anxiety.  No memory loss.   PHYSICAL EXAM: Blood pressure 133/88, pulse 81, temperature 98.5 F (36.9 C), temperature source Oral, resp. rate 18, SpO2 98.00%.  General appearance :Awake, alert, not in any distress. Speech Clear. Not toxic Looking HEENT: Atraumatic and Normocephalic, pupils equally reactive to light and accomodation Neck: supple, no JVD. No cervical lymphadenopathy.  Chest:Good air entry bilaterally, no added sounds  CVS: S1 S2 regular, no murmurs.  Abdomen: Bowel sounds present, Non tender and not distended with no gaurding, rigidity or rebound. No CVA tenderness Extremities: B/L Lower Ext shows no edema, both legs are warm to touch Neurology: Awake alert, and oriented X 3, CN II-XII intact, Non focal Skin:No Rash Wounds:N/A  LABS ON ADMISSION:   Recent Labs  03/21/13 0901  NA 139  K 3.7  CL 103  CO2 26  GLUCOSE 87  BUN 30*  CREATININE 4.75*  CALCIUM 8.8    Recent Labs  03/21/13 0901  AST 83*  ALT 25  ALKPHOS 76  BILITOT 4.0*  PROT 7.3  ALBUMIN 3.1*   No results found for this basename: LIPASE, AMYLASE,  in the last 72 hours  Recent Labs  03/21/13 0901  WBC 12.5*  NEUTROABS 7.6  HGB 5.9*  HCT 16.8*  MCV 87.5  PLT 273   No results found for this basename: CKTOTAL, CKMB, CKMBINDEX, TROPONINI,  in the last 72 hours No results found for this basename: DDIMER,  in the last 72 hours No components found with this basename: POCBNP,    RADIOLOGIC STUDIES ON ADMISSION: No results found.   ASSESSMENT AND PLAN: Present on Admission:  Sickle cell crisis  - Pain consistent with sickle cell painful crisis  - Given significant anemia, will transfuse 2 units of PRBC during hemodialysis today - Unfortunately given end-stage renal  disease will not give him IV fluids, but clinically looks euvolemic  - As needed Percocet for moderate pain and Dilaudid for severe pain.  Marland Kitchen ESRD on dialysis - Nephrology has been consulted, spoke with Dr. Arlean Hopping who will see him today.  Anemia - Likely secondary to a known sickle cell disorder and end-stage renal disease - Will be getting 2 units of PRBC with dialysis today.  Further plan will depend as patient's clinical course evolves and further radiologic and laboratory data become available. Patient will be monitored closely.   DVT Prophylaxis: Prophylactic  Heparin  Code Status: Full Code  Total time spent for admission equals 45 minutes.  [facility] Pager [phone]  If 7PM-7AM, please contact night-coverage [email or web address] Password [facility] 03/21/2013, 11:39 AM

## 2013-03-21 NOTE — Consult Note (Signed)
Philip Rios 03/21/2013 [name] Requesting Physician:  Dr Jerral Ralph  Reason for Consult:  ESRD pt with sickle cell crisis and anemia HPI: The patient is a 27 y.o. year-old with hx of SCD had one hour HD this am and developed severe pain entire body. HD was stopped and pt came to ED. Hb was low at 5.9 and he has 2u prbc's ordered.  Was admitted here w similar presentation 2 weeks ago, low Hgb, painful crisis > transfused. On 6/8 ferritin was 4222.  He gets aranesp 150ug every Thursday at [facility].   No complaints now  ROS  no sob, cough  +chest pressure, pain all over body  no abd pain, no n/v/d  no skin rash or itching  no jt pain  no confusion    Past Medical History  Past Medical History  Diagnosis Date  . Sickle cell disease, type SS   . Asthma   . Renal disorder     end stage renal failure  . Stroke 2000    no deficits  . Pica   . Anemia   . Hyperparathyroidism   . Polysubstance abuse   . CHF (congestive heart failure)   . ESRD on dialysis 03/07/2013    Patient has ESRD most likely from sickle cell disease.  He is dialyzed at [facility] in [place], [place]. His renal doctor's are from [facility].  He has a LUA AVF which was first used early June 2014 and has a tunneled HD cath which is supposed to be removed soon.  He started dialysis in July 2013. HD schedule is TTS, 4hrs, dry weight 136 lbs as of June 2014.      Past Surgical History  Past Surgical History  Procedure Laterality Date  . Cholecystectomy    . Renal biopsy    . Av fistula placement      Family History  Family History  Problem Relation Age of Onset  . Diabetes Sister   . Other Mother   . Other Father    Social History  reports that he has never smoked. He has never used smokeless tobacco. He reports that he uses illicit drugs (Marijuana) about 7 times per week. He reports that he does not drink alcohol.  Allergies  Allergies  Allergen Reactions  . Codeine Hives    Home  medications Prior to Admission medications   Medication Sig Start Date End Date Taking? Authorizing Provider  cinacalcet (SENSIPAR) 30 MG tablet Take 30 mg by mouth daily.   Yes Historical Provider, MD  folic acid (FOLVITE) 1 MG tablet Take 1 tablet (1 mg total) by mouth daily. 03/09/13  Yes Rodolph Bong, MD  oxyCODONE-acetaminophen (PERCOCET/ROXICET) 5-325 MG per tablet Take 1-2 tablets by mouth every 6 (six) hours as needed for pain. 03/09/13  Yes Rodolph Bong, MD  sevelamer carbonate (RENVELA) 800 MG tablet Take 800 mg by mouth 3 (three) times daily with meals.   Yes Historical Provider, MD    LABS Liver Function Tests  Recent Labs Lab 03/21/13 0901  AST 83*  ALT 25  ALKPHOS 76  BILITOT 4.0*  PROT 7.3  ALBUMIN 3.1*   No results found for this basename: LIPASE, AMYLASE,  in the last 168 hours CBC  Recent Labs Lab 03/21/13 0901 03/21/13 1243  WBC 12.5* 11.9*  NEUTROABS 7.6 6.9  HGB 5.9* 6.2*  HCT 16.8* 17.9*  MCV 87.5 87.7  PLT 273 277   Basic Metabolic Panel  Recent Labs Lab  03/21/13 0901 03/21/13 1243  NA 139  --   K 3.7  --   CL 103  --   CO2 26  --   GLUCOSE 87  --   BUN 30*  --   CREATININE 4.75* 5.09*  CALCIUM 8.8  --     Physical Exam:  Blood pressure 167/88, pulse 86, temperature 98.7 F (37.1 C), temperature source Oral, resp. rate 18, SpO2 98.00%. Gen: alert young AAM no distress HEENT:  EOMI, sclera anicteric, throat clear Neck: no JVD, no LAN Chest: clear bilat CV: regular, no rub or gallop, no carotid or femoral bruits, pedal pulses intact Abdomen: soft, nontender, no mass or HSM Ext: no LE edema, no joint effusion or deformity, no gangrene or ulceration Neuro: alert, Ox3, no focal deficit Access: LUA AVF +bruit  Outpatient HD: [facility] TTS 4 hrs EDW 136lb Heparin 2200 bolus then 500/hr maint Bath 2K/2.5CA F180 Aranesp 120ug/wk on thursdays Hect 6ug/hd   Impression/Plan 1. Sickle cell painful crisis 2. 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 ESRD, usual HD TTS- complete HD today for 3 hours more 4. Anemia of CKD- aranesp q Thursday  5. Secondary HPTH- cont meds    Vinson Moselle  MD [facility] [phone] pgr    [phone] cell 03/21/2013, 5:06 PM

## 2013-03-21 NOTE — ED Provider Notes (Signed)
Medical screening examination/treatment/procedure(s) were conducted as a shared visit with non-physician practitioner(s) and myself.  I personally evaluated the patient during the encounter  Philip Rios is a 27 y.o. male hx of sickle cell anemia here with leg pain while at dialysis. He also felt a little light headed. He had similar symptoms prior and was admitted for sickle cell crisis recently. He is comfortable on my exam and denies chest pain. He is anemic compared to baseline and his reticulocyte count is elevated appropriately. He was transfused 2 U PRBC and admitted for symptomatic anemia and sickle cell crisis.   CRITICAL CARE Performed by: Silverio Lay, Katleen Carraway   Total critical care time: 30 min   Critical care time was exclusive of separately billable procedures and treating other patients.  Critical care was necessary to treat or prevent imminent or life-threatening deterioration.  Critical care was time spent personally by me on the following activities: development of treatment plan with patient and/or surrogate as well as nursing, discussions with consultants, evaluation of patient's response to treatment, examination of patient, obtaining history from patient or surrogate, ordering and performing treatments and interventions, ordering and review of laboratory studies, ordering and review of radiographic studies, pulse oximetry and re-evaluation of patient's condition.    Richardean Canal, MD 03/21/13 [phone]

## 2013-03-21 NOTE — ED Notes (Signed)
Pt states pain all over body, started during HD treatment this morning. Currently pain free after receiving pain medications from EMS. Vital signs stable. Pt is alert and oriented x4. Respirations unlabored.

## 2013-03-21 NOTE — Progress Notes (Signed)
Utilization Review Completed. 03/21/2013  

## 2013-03-21 NOTE — ED Provider Notes (Signed)
History     CSN: [id number]  Arrival date & time 03/21/13  4098   First MD Initiated Contact with Patient 03/21/13 [phone]      Chief Complaint  Patient presents with  . Sickle Cell Pain Crisis    (Consider location/radiation/quality/duration/timing/severity/associated sxs/prior treatment) HPI Comments: Patient presents to the emergency department with chief complaint of sickle cell pain. He states the pain first occurred while receiving dialysis this morning. He states that the pain was 7/10. He states that he came on after approximately 10 minutes of dialysis therapy. He states that this has happened repeatedly in the past. He was given fentanyl by EMS, and states that his pain is now 2/10. He has been admitted for this in the past.  States that he is feeling better now.  The history is provided by the patient. No language interpreter was used.    Past Medical History  Diagnosis Date  . Sickle cell disease, type SS   . Asthma   . Renal disorder     end stage renal failure  . Stroke 2000    no deficits  . Pica   . Anemia   . Hyperparathyroidism   . Polysubstance abuse   . CHF (congestive heart failure)   . ESRD on dialysis 03/07/2013    Patient has ESRD most likely from sickle cell disease.  He is dialyzed at [facility] in [place], [place]. His renal doctor's are from [facility].  He has a LUA AVF which was first used early June 2014 and has a tunneled HD cath which is supposed to be removed soon.  He started dialysis in July 2013. HD schedule is TTS, 4hrs, dry weight 136 lbs as of June 2014.      Past Surgical History  Procedure Laterality Date  . Cholecystectomy    . Renal biopsy    . Av fistula placement      Family History  Problem Relation Age of Onset  . Diabetes Sister   . Other Mother   . Other Father     History  Substance Use Topics  . Smoking status: Never Smoker   . Smokeless tobacco: Never Used  . Alcohol Use: No      Review of Systems  All  other systems reviewed and are negative.    Allergies  Codeine  Home Medications   Current Outpatient Rx  Name  Route  Sig  Dispense  Refill  . cinacalcet (SENSIPAR) 30 MG tablet   Oral   Take 30 mg by mouth daily.         . folic acid (FOLVITE) 1 MG tablet   Oral   Take 1 tablet (1 mg total) by mouth daily.         Marland Kitchen oxyCODONE-acetaminophen (PERCOCET/ROXICET) 5-325 MG per tablet   Oral   Take 1-2 tablets by mouth every 6 (six) hours as needed for pain.   30 tablet   0   . polyethylene glycol (MIRALAX / GLYCOLAX) packet   Oral   Take 17 g by mouth 2 (two) times daily. Hold if you develop diarrhea.   14 each   0   . sevelamer carbonate (RENVELA) 800 MG tablet   Oral   Take 800 mg by mouth 3 (three) times daily with meals.           There were no vitals taken for this visit.  Physical Exam  Nursing note and vitals reviewed. Constitutional: He is oriented to person, place,  and time. He appears well-developed and well-nourished.  HENT:  Head: Normocephalic and atraumatic.  Eyes: Conjunctivae and EOM are normal. Pupils are equal, round, and reactive to light. Right eye exhibits no discharge. Left eye exhibits no discharge. Scleral icterus is present.  Neck: Normal range of motion. Neck supple. No JVD present.  Cardiovascular: Normal rate, regular rhythm, normal heart sounds and intact distal pulses.  Exam reveals no gallop and no friction rub.   No murmur heard. Pulmonary/Chest: Effort normal and breath sounds normal. No respiratory distress. He has no wheezes. He has no rales. He exhibits no tenderness.  Abdominal: Soft. Bowel sounds are normal. He exhibits no distension and no mass. There is no tenderness. There is no rebound and no guarding.  Musculoskeletal: Normal range of motion. He exhibits no edema and no tenderness.  Neurological: He is alert and oriented to person, place, and time.  CN 3-12 intact  Skin: Skin is warm and dry.  Psychiatric: He has a  normal mood and affect. His behavior is normal. Judgment and thought content normal.    ED Course  Procedures (including critical care time)  Labs Reviewed  CBC WITH DIFFERENTIAL  COMPREHENSIVE METABOLIC PANEL  RETICULOCYTES   Results for orders placed during the hospital encounter of 03/21/13  CBC WITH DIFFERENTIAL      Result Value Range   WBC 12.5 (*) 4.0 - 10.5 K/uL   RBC 1.92 (*) 4.22 - 5.81 MIL/uL   Hemoglobin 5.9 (*) 13.0 - 17.0 g/dL   HCT 08.6 (*) 57.8 - 46.9 %   MCV 87.5  78.0 - 100.0 fL   MCH 30.7  26.0 - 34.0 pg   MCHC 35.1  30.0 - 36.0 g/dL   RDW 62.9 (*) 52.8 - 41.3 %   Platelets 273  150 - 400 K/uL   Neutrophils Relative % 61  43 - 77 %   Lymphocytes Relative 23  12 - 46 %   Monocytes Relative 7  3 - 12 %   Eosinophils Relative 9 (*) 0 - 5 %   Basophils Relative 0  0 - 1 %   Neutro Abs 7.6  1.7 - 7.7 K/uL   Lymphs Abs 2.9  0.7 - 4.0 K/uL   Monocytes Absolute 0.9  0.1 - 1.0 K/uL   Eosinophils Absolute 1.1 (*) 0.0 - 0.7 K/uL   Basophils Absolute 0.0  0.0 - 0.1 K/uL   RBC Morphology TARGET CELLS    COMPREHENSIVE METABOLIC PANEL      Result Value Range   Sodium 139  135 - 145 mEq/L   Potassium 3.7  3.5 - 5.1 mEq/L   Chloride 103  96 - 112 mEq/L   CO2 26  19 - 32 mEq/L   Glucose, Bld 87  70 - 99 mg/dL   BUN 30 (*) 6 - 23 mg/dL   Creatinine, Ser 2.44 (*) 0.50 - 1.35 mg/dL   Calcium 8.8  8.4 - 01.0 mg/dL   Total Protein 7.3  6.0 - 8.3 g/dL   Albumin 3.1 (*) 3.5 - 5.2 g/dL   AST 83 (*) 0 - 37 U/L   ALT 25  0 - 53 U/L   Alkaline Phosphatase 76  39 - 117 U/L   Total Bilirubin 4.0 (*) 0.3 - 1.2 mg/dL   GFR calc non Af Amer 16 (*) >90 mL/min   GFR calc Af Amer 18 (*) >90 mL/min  RETICULOCYTES      Result Value Range   Retic Ct Pct  5.5 (*) 0.4 - 3.1 %   RBC. 1.92 (*) 4.22 - 5.81 MIL/uL   Retic Count, Manual 105.6  19.0 - 186.0 K/uL       1. Sickle cell anemia with pain       MDM  Patient with sickle cell disease.  Will check basic labs and manage  pain.  Pain is well controlled.  Discussed patient and lab findings with Dr. Silverio Lay, who advises that we admit the patient for sickle cell anemia. Hemoglobin is near baseline, however feel that transfusion is warranted at this time.  Discussed patient with Dr. Jerral Ralph, who will admit the patient.  11:26 AM  nephrology has agreed to have the patient to the dialysis list today. I informed them of his low hemoglobin, and the fact that we're planning to transfuse blood.    Roxy Horseman, PA-C 03/21/13 1127

## 2013-03-21 NOTE — ED Notes (Signed)
Pt presents to department via [facility] from dialysis treatment for evaluation of sickle cell crisis. States he began having pain entire body during HD treatment, received x1 hour of dialysis today. Received fentanyl per EMS. 4/10 pain upon arrival to ED. 20g R forearm. Pt is conscious alert and oriented x4.

## 2013-03-22 ENCOUNTER — Encounter (HOSPITAL_COMMUNITY): Payer: Self-pay | Admitting: Internal Medicine

## 2013-03-22 LAB — COMPREHENSIVE METABOLIC PANEL
ALT: 25 U/L (ref 0–53)
Alkaline Phosphatase: 75 U/L (ref 39–117)
BUN: 21 mg/dL (ref 6–23)
Chloride: 100 mEq/L (ref 96–112)
GFR calc Af Amer: 26 mL/min — ABNORMAL LOW (ref >90)
Glucose, Bld: 92 mg/dL (ref 70–99)
Potassium: 4.1 mEq/L (ref 3.5–5.1)
Sodium: 136 mEq/L (ref 135–145)
Total Bilirubin: 7.5 mg/dL — ABNORMAL HIGH (ref 0.3–1.2)
Total Protein: 7.1 g/dL (ref 6.0–8.3)

## 2013-03-22 LAB — HEPATITIS B SURFACE ANTIGEN: Hepatitis B Surface Ag: NEGATIVE

## 2013-03-22 LAB — TYPE AND SCREEN
ABO/RH(D): O NEG
Antibody Screen: NEGATIVE
Unit division: 0

## 2013-03-22 LAB — CBC
HCT: 23.7 % — ABNORMAL LOW (ref 39.0–52.0)
MCHC: 34.6 g/dL (ref 30.0–36.0)
MCV: 88.8 fL (ref 78.0–100.0)
Platelets: 236 10*3/uL (ref 150–400)
RDW: 17.7 % — ABNORMAL HIGH (ref 11.5–15.5)

## 2013-03-22 MED ORDER — POLYETHYLENE GLYCOL 3350 17 G PO PACK
17.0000 g | PACK | Freq: Every day | ORAL | Status: DC
  Administered 2013-03-22: 17 g via ORAL
  Filled 2013-03-22: qty 1

## 2013-03-22 MED ORDER — OXYCODONE-ACETAMINOPHEN 5-325 MG PO TABS: 1.0000 | ORAL_TABLET | Freq: Four times a day (QID) | ORAL | Status: DC | PRN

## 2013-03-22 MED ORDER — DOCUSATE SODIUM 100 MG PO CAPS
100.0000 mg | ORAL_CAPSULE | Freq: Two times a day (BID) | ORAL | Status: DC
Start: 2013-03-22 — End: 2013-03-22
  Administered 2013-03-22: 100 mg via ORAL
  Filled 2013-03-22: qty 1

## 2013-03-22 NOTE — Progress Notes (Signed)
Patient discharge Home per Md order.  Discharge instructions reviewed with patient and family.  Copies of all forms given and explained. Patient/family voiced understanding of all instructions.  Discharge in no acute distress. 

## 2013-03-22 NOTE — Discharge Summary (Signed)
Physician Discharge Summary  Philip Rios WRU:[id number] DOB: 1985-11-14 DOA: 03/21/2013  PCP: Philip German, Rios  Admit date: 03/21/2013 Discharge date: 03/22/2013  Recommendations for Outpatient Follow-up:  1. Follow up with Hematology as soon as possible.  May follow up at the [facility] clinic or with Dr. Ashley Rios, Dr. August Rios.  Discharge Diagnoses:  Principal Problem:   Sickle cell crisis Active Problems:   ESRD on dialysis   Transfusion associated hemochromatosis   Discharge Condition: stable, improved  Diet recommendation: regular  Wt Readings from Last 3 Encounters:  03/22/13 58.6 kg (129 lb 3 oz)  03/08/13 63 kg (138 lb 14.2 oz)  11/29/12 61.236 kg (135 lb)    History of present illness:   Philip Rios is a 27 y.o. male with a Past Medical History of sickle cell disease, end-stage renal disease on hemodialysis who presents today with the above noted complaint. Per patient, he was in his usual state of health until this morning, he claims that this when he went to hemodialysis this morning, he started having generalized body aches, but mostly in his low back area. He feels that the pain is similar to his usual sickle cell crises. He unfortunately only completed one hour of dialysis and was sent to the ED for further evaluation and treatment. He denies any fever, cough or shortness of breath. He also denies any chest pain. He also denies any nausea, vomiting or diarrhea. He also denies any abdominal pain. In the emergency room, he was found to have a hemoglobin of 5.9, I was asked to admit this patient for further evaluation and treatment.   During my evaluation, patient claims that his back pain was already better, he seems very comfortable and did not have any major complaints-except noted above.  Hospital Course:   Sickle cell crisis with history of stroke Pain consistent with sickle cell crisis.  Labs were notable for a mild leukocytosis of 11.9, hemoglobin of 6.2, total  bilirubin is 7.5, reticulocyte count of 5%.  He was transfused 2 units of red blood cells during hemodialysis and give in his oral pain medication with additional Dilaudid 1 mg IV every 4 hours when necessary. His pain resolved over the first 24 hours.  He was advised to take ibuprofen and percocet at home for his pain crisis.  He has not had a hemoglobin electrophoresis in more than a year, but his previous electrophoreses demonstrated HgS of >80%. He does no have a regular hematologist.  Despite multiple pain crises this year, I will not start him on hydroxyurea because this requires close outpatient follow up to monitor for side effects and blood dyscrasias.    Marland Kitchen ESRD on dialysis.  Had HD on Saturday, June 21st.   Anemia  Likely secondary to a known sickle cell disorder and end-stage renal disease   Iatrogenic hemochromatosis.  His last ferritin a few weeks ago was in the 4000s.  At risk for diabetes, heart failure, and cirrhosis.  Exjade contraindicated in the setting of renal impairment.    Procedures:  HD  Transfused 2 units PRBC  Consultations:  Nephrology  Discharge Exam: Filed Vitals:   03/22/13 0618  BP: 134/88  Pulse: 76  Temp: 98.7 F (37.1 C)  Resp: 23   Filed Vitals:   03/21/13 2321 03/21/13 2330 03/22/13 0013 03/22/13 0618  BP: 138/87 166/99 138/86 134/88  Pulse: 83 90 86 76  Temp: 98.1 F (36.7 C)  98.4 F (36.9 C) 98.7 F (37.1 C)  TempSrc: Oral  Oral Oral  Resp: 18 18 19 23   Height:   5\' 11"  (1.803 m)   Weight: 58.4 kg (128 lb 12 oz)  58.6 kg (129 lb 3 oz)   SpO2: 99%  95% 100%    General: Thin [place] male, no acute distress HEENT: Normocephalic, atraumatic, scleral icterus Cardiovascular: Regular rate and rhythm, no murmurs rubs or gallops, 2+ pulses Respiratory: Clear to auscultation bilaterally ABD:  NABS, soft, ND/NT MSK:  No LEE, normal tone and bulk, no back pain with palpation of the lower back  Discharge Instructions       Discharge Orders   Future Orders Complete By Expires     Call Rios for:  difficulty breathing, headache or visual disturbances  As directed     Call Rios for:  extreme fatigue  As directed     Call Rios for:  hives  As directed     Call Rios for:  persistant dizziness or light-headedness  As directed     Call Rios for:  persistant nausea and vomiting  As directed     Call Rios for:  severe uncontrolled pain  As directed     Call Rios for:  temperature >100.4  As directed     Diet general  As directed     Discharge instructions  As directed     Comments:      You're hospitalized with pain crisis. Your symptoms improved with blood transfusion. Please continue to take ibuprofen and Percocet as needed at home for pain. Please followup with a hematologist within one to 2 weeks of discharge for repeat blood work and to talk about health care maintenance.  You may benefit from some medications to decrease the frequency with which you're having pain crises.  You also have elevated iron levels because of the many blood transfusions you have received. High iron levels cause hemochromatosis which can lead to heart failure, diabetes, and liver cirrhosis.  The oral medications we use to treat this condition cannot be used in the setting of kidney failure.  You will need to talk to your nephrologist and your hematologist about other options.    Increase activity slowly  As directed         Medication List    TAKE these medications       cinacalcet 30 MG tablet  Commonly known as:  SENSIPAR  Take 30 mg by mouth daily.     folic acid 1 MG tablet  Commonly known as:  FOLVITE  Take 1 tablet (1 mg total) by mouth daily.     oxyCODONE-acetaminophen 5-325 MG per tablet  Commonly known as:  PERCOCET/ROXICET  Take 1-2 tablets by mouth every 6 (six) hours as needed for pain.     sevelamer carbonate 800 MG tablet  Commonly known as:  RENVELA  Take 800 mg by mouth 3 (three) times daily with meals.       Follow-up  Information   Follow up with AVBUERE,Philip Rios. Schedule an appointment as soon as possible for a visit in 2 weeks.   Contact information:   [address] [place] [address] [phone]       Follow up with Philip Rios ERIC, Rios. (option for hematology)    Contact information:   [facility] [address] [place] [address] [phone]       Follow up with MATTHEWS,MICHELLE A., Rios. (option for hematology)    Contact information:   [address]  [address] [place] [address] [phone]        The results of significant diagnostics from this hospitalization (including imaging, microbiology, ancillary and laboratory) are listed below for reference.    Significant Diagnostic Studies: No results found.  Microbiology: No results found for this or any previous visit (from the past 240 hour(s)).   Labs: Basic Metabolic Panel:  Recent Labs Lab 03/21/13 0901 03/21/13 1243 03/22/13 0520  NA 139  --  136  K 3.7  --  4.1  CL 103  --  100  CO2 26  --  29  GLUCOSE 87  --  92  BUN 30*  --  21  CREATININE 4.75* 5.09* 3.54*  CALCIUM 8.8  --  8.9  MG  --  2.2  --    Liver Function Tests:  Recent Labs Lab 03/21/13 0901 03/22/13 0520  AST 83* 71*  ALT 25 25  ALKPHOS 76 75  BILITOT 4.0* 7.5*  PROT 7.3 7.1  ALBUMIN 3.1* 3.2*   No results found for this basename: LIPASE, AMYLASE,  in the last 168 hours No results found for this basename: AMMONIA,  in the last 168 hours CBC:  Recent Labs Lab 03/21/13 0901 03/21/13 1243 03/22/13 1150  WBC 12.5* 11.9* 12.4*  NEUTROABS 7.6 6.9  --   HGB 5.9* 6.2* 8.2*  HCT 16.8* 17.9* 23.7*  MCV 87.5 87.7 88.8  PLT 273 277 236   Cardiac Enzymes: No results found for this basename: CKTOTAL, CKMB, CKMBINDEX, TROPONINI,  in the last 168 hours BNP: BNP (last 3 results) No results found for this basename: PROBNP,  in the last 8760 hours CBG: No results found for this basename: GLUCAP,  in the  last 168 hours  Time coordinating discharge: 45 minutes  Signed:  Islay Polanco  [facility] 03/22/2013, 1:40 PM

## 2013-03-22 NOTE — Progress Notes (Signed)
Subjective: No new complaints  Objective Filed Vitals:   03/21/13 2321 03/21/13 2330 03/22/13 0013 03/22/13 0618  BP: 138/87 166/99 138/86 134/88  Pulse: 83 90 86 76  Temp: 98.1 F (36.7 C)  98.4 F (36.9 C) 98.7 F (37.1 C)  TempSrc: Oral  Oral Oral  Resp: 18 18 19 23   Height:   5\' 11"  (1.803 m)   Weight: 58.4 kg (128 lb 12 oz)  58.6 kg (129 lb 3 oz)   SpO2: 99%  95% 100%    CBC:  Recent Labs Lab 03/21/13 0901 03/21/13 1243  WBC 12.5* 11.9*  NEUTROABS 7.6 6.9  HGB 5.9* 6.2*  HCT 16.8* 17.9*  MCV 87.5 87.7  PLT 273 277   Basic Metabolic Panel:  Recent Labs Lab 03/21/13 0901 03/21/13 1243 03/22/13 0520  NA 139  --  136  K 3.7  --  4.1  CL 103  --  100  CO2 26  --  29  GLUCOSE 87  --  92  BUN 30*  --  21  CREATININE 4.75* 5.09* 3.54*  CALCIUM 8.8  --  8.9    Physical Exam:  Blood pressure 134/88, pulse 76, temperature 98.7 F (37.1 C), temperature source Oral, resp. rate 23, height 5\' 11"  (1.803 m), weight 58.6 kg (129 lb 3 oz), SpO2 100.00%.  Gen: alert young AAM no distress  HEENT: EOMI, sclera anicteric, throat clear  Neck: no JVD, no LAN  Chest: clear bilat  CV: regular, no rub or gallop, no carotid or femoral bruits, pedal pulses intact  Abdomen: soft, nontender, no mass or HSM  Ext: no LE edema, no joint effusion or deformity, no gangrene or ulceration  Neuro: alert, Ox3, no focal deficit  Access: LUA AVF +bruit   Outpatient HD: [facility]  TTS   4hrs   EDW 136lb    Heparin 2200 bolus then 500/hr maint    Bath 2K/2.5CA    F180   Aranesp 120ug/wk on thursdays   Hect 6ug/hd   Impression/Plan  1. Sickle cell painful crisis- per primary 2. 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 ESRD, cont HD TTS 4. Secondary HPTH- cont meds 5. HTN/volume- wt is down to 128 lbs, no problem with UF yest (2kg), BP high and now 8lbs under dry wt, will need EDW adjusted down at d/c     Vinson Moselle  MD [phone]  pgr    [phone] cell 03/22/2013, 9:07 AM

## 2013-04-14 ENCOUNTER — Encounter (HOSPITAL_COMMUNITY): Payer: Self-pay | Admitting: *Deleted

## 2013-04-14 ENCOUNTER — Non-Acute Institutional Stay (HOSPITAL_COMMUNITY)
Admission: EM | Admit: 2013-04-14 | Discharge: 2013-04-14 | Disposition: A | Discharge status: Home / Self Care | Payer: Federal, State, Local not specified - PPO | Attending: Emergency Medicine | Admitting: Emergency Medicine

## 2013-04-14 DIAGNOSIS — J45909 Unspecified asthma, uncomplicated: Secondary | ICD-10-CM | POA: Insufficient documentation

## 2013-04-14 DIAGNOSIS — Z79899 Other long term (current) drug therapy: Secondary | ICD-10-CM | POA: Insufficient documentation

## 2013-04-14 DIAGNOSIS — R109 Unspecified abdominal pain: Secondary | ICD-10-CM | POA: Insufficient documentation

## 2013-04-14 DIAGNOSIS — D5700 Hb-SS disease with crisis, unspecified: Secondary | ICD-10-CM

## 2013-04-14 DIAGNOSIS — E213 Hyperparathyroidism, unspecified: Secondary | ICD-10-CM | POA: Insufficient documentation

## 2013-04-14 DIAGNOSIS — I509 Heart failure, unspecified: Secondary | ICD-10-CM | POA: Insufficient documentation

## 2013-04-14 DIAGNOSIS — Z885 Allergy status to narcotic agent status: Secondary | ICD-10-CM | POA: Insufficient documentation

## 2013-04-14 DIAGNOSIS — F1910 Other psychoactive substance abuse, uncomplicated: Secondary | ICD-10-CM | POA: Insufficient documentation

## 2013-04-14 DIAGNOSIS — F5089 Other specified eating disorder: Secondary | ICD-10-CM | POA: Insufficient documentation

## 2013-04-14 DIAGNOSIS — N186 End stage renal disease: Secondary | ICD-10-CM | POA: Insufficient documentation

## 2013-04-14 DIAGNOSIS — D649 Anemia, unspecified: Secondary | ICD-10-CM

## 2013-04-14 DIAGNOSIS — Z992 Dependence on renal dialysis: Secondary | ICD-10-CM | POA: Insufficient documentation

## 2013-04-14 LAB — CBC WITH DIFFERENTIAL/PLATELET
Basophils Relative: 0 % (ref 0–1)
Eosinophils Absolute: 1.2 10*3/uL — ABNORMAL HIGH (ref 0.0–0.7)
HCT: 19.4 % — ABNORMAL LOW (ref 39.0–52.0)
Hemoglobin: 6.5 g/dL — CL (ref 13.0–17.0)
Lymphs Abs: 3.3 10*3/uL (ref 0.7–4.0)
MCH: 30.7 pg (ref 26.0–34.0)
MCHC: 33.5 g/dL (ref 30.0–36.0)
Monocytes Absolute: 1 10*3/uL (ref 0.1–1.0)
Neutro Abs: 9.4 10*3/uL — ABNORMAL HIGH (ref 1.7–7.7)
RDW: 19 % — ABNORMAL HIGH (ref 11.5–15.5)

## 2013-04-14 LAB — URINALYSIS, ROUTINE W REFLEX MICROSCOPIC
Bilirubin Urine: NEGATIVE
Glucose, UA: NEGATIVE mg/dL
Nitrite: NEGATIVE
Specific Gravity, Urine: 1.009 (ref 1.005–1.030)
pH: 8 (ref 5.0–8.0)

## 2013-04-14 LAB — BASIC METABOLIC PANEL
BUN: 48 mg/dL — ABNORMAL HIGH (ref 6–23)
CO2: 24 mEq/L (ref 19–32)
Chloride: 104 mEq/L (ref 96–112)
GFR calc Af Amer: 13 mL/min — ABNORMAL LOW (ref >90)
Glucose, Bld: 92 mg/dL (ref 70–99)
Potassium: 4.4 mEq/L (ref 3.5–5.1)

## 2013-04-14 LAB — URINE MICROSCOPIC-ADD ON

## 2013-04-14 LAB — HEPATITIS B SURFACE ANTIGEN: Hepatitis B Surface Ag: NEGATIVE

## 2013-04-14 LAB — PREPARE RBC (CROSSMATCH)

## 2013-04-14 MED ORDER — HEPARIN SODIUM (PORCINE) 1000 UNIT/ML DIALYSIS: 1000.0000 [IU] | INTRAMUSCULAR | Status: DC | PRN

## 2013-04-14 MED ORDER — HEPARIN SODIUM (PORCINE) 1000 UNIT/ML DIALYSIS: 20.0000 [IU]/kg | INTRAMUSCULAR | Status: DC | PRN

## 2013-04-14 MED ORDER — SODIUM CHLORIDE 0.9 % IV SOLN: 100.0000 mL | INTRAVENOUS | Status: DC | PRN

## 2013-04-14 MED ORDER — SODIUM CHLORIDE 0.9 % IV SOLN: INTRAVENOUS | Status: DC

## 2013-04-14 MED ORDER — DIPHENHYDRAMINE HCL 25 MG PO CAPS: 25.0000 mg | ORAL_CAPSULE | Freq: Once | ORAL | Status: DC

## 2013-04-14 MED ORDER — ALTEPLASE 2 MG IJ SOLR
2.0000 mg | Freq: Once | INTRAMUSCULAR | Status: DC | PRN
  Filled 2013-04-14: qty 2

## 2013-04-14 MED ORDER — HYDROMORPHONE HCL PF 1 MG/ML IJ SOLN
0.5000 mg | Freq: Once | INTRAMUSCULAR | Status: AC
  Administered 2013-04-14: 0.5 mg via INTRAVENOUS
  Filled 2013-04-14: qty 1

## 2013-04-14 MED ORDER — PENTAFLUOROPROP-TETRAFLUOROETH EX AERO: 1.0000 "application " | INHALATION_SPRAY | CUTANEOUS | Status: DC | PRN

## 2013-04-14 MED ORDER — HYDROMORPHONE HCL PF 1 MG/ML IJ SOLN
2.0000 mg | Freq: Once | INTRAMUSCULAR | Status: AC
  Administered 2013-04-14: 2 mg via INTRAVENOUS

## 2013-04-14 MED ORDER — ACETAMINOPHEN 325 MG PO TABS
650.0000 mg | ORAL_TABLET | Freq: Once | ORAL | Status: AC
  Administered 2013-04-14: 650 mg via ORAL

## 2013-04-14 MED ORDER — HYDROMORPHONE HCL PF 1 MG/ML IJ SOLN
1.0000 mg | Freq: Once | INTRAMUSCULAR | Status: AC
  Administered 2013-04-14: 1 mg via INTRAVENOUS
  Filled 2013-04-14: qty 1

## 2013-04-14 MED ORDER — LIDOCAINE-PRILOCAINE 2.5-2.5 % EX CREA
1.0000 "application " | TOPICAL_CREAM | CUTANEOUS | Status: DC | PRN
  Filled 2013-04-14: qty 5

## 2013-04-14 MED ORDER — NEPRO/CARBSTEADY PO LIQD
237.0000 mL | ORAL | Status: DC | PRN
  Filled 2013-04-14: qty 237

## 2013-04-14 MED ORDER — LIDOCAINE HCL (PF) 1 % IJ SOLN: 5.0000 mL | INTRAMUSCULAR | Status: DC | PRN

## 2013-04-14 NOTE — Procedures (Signed)
Patient was seen on dialysis and the procedure was supervised.  BFR 400  Via AVF BP is  129/72.   Patient appears to be tolerating treatment well.  He is a pt from [facility] with sickle cell anemia- he received an hour of HD today when had acute flank pain which brought him to the ER.  His hgb was low at 6.5 so will get the remainder of his HD here with a transfusion and then be discharged in order to resume his regular HD as an OP on Thursday   Philip Rios A 04/14/2013

## 2013-04-14 NOTE — ED Notes (Signed)
Patient started dialysis this am and after approx 8 minutes on treatment started having bilateral flank pain, patient states this has happened before when his hgb was low

## 2013-04-14 NOTE — ED Provider Notes (Signed)
History    CSN: [id number] Arrival date & time 04/14/13  5621  First MD Initiated Contact with Patient 04/14/13 [phone]     Chief Complaint  Patient presents with  . 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 States gradual onset of "typical pain", bilateral flank - chest wall. Was at dialysis this am and only received 1hr of typical 4 hr session. Had to stop 2/2 pain. Baseline hgb 8, today noted to be 7.2. No dyspnea, chest pain, cough, fever, n/v/d. Pt hx of transfusion sev months ago. Denies dysuria/polyuria/hematuria.     Patient is a 27 y.o. male presenting with flank pain. The history is provided by the patient. No language interpreter was used.  Flank Pain This is a recurrent problem. The current episode started in the past 7 days. The problem occurs constantly. The problem has been gradually worsening. Pertinent negatives include no abdominal pain, chest pain, chills, congestion, coughing, fever, headaches, nausea, rash, sore throat or vomiting. He has tried nothing for the symptoms. The treatment provided no relief.   Past Medical History  Diagnosis Date  . Sickle cell disease, type SS     frequent pain crises + hx of stroke  . Asthma   . Renal disorder     end stage renal failure  . Stroke 2000    no deficits  . Pica   . Anemia   . Hyperparathyroidism   . Polysubstance abuse   . CHF (congestive heart failure)   . ESRD on dialysis 03/07/2013    Patient has ESRD most likely from sickle cell disease.  He is dialyzed at [facility] in [place], [place]. His renal doctor's are from [facility].  He has a LUA AVF which was first used early June 2014 and has a tunneled HD cath which is supposed to be removed soon.  He started dialysis in July 2013. HD schedule is TTS, 4hrs, dry weight 136 lbs as of June 2014.    . Transfusion  associated hemochromatosis    Past Surgical History  Procedure Laterality Date  . Cholecystectomy    . Renal biopsy    . Av fistula placement     Family History  Problem Relation Age of Onset  . Diabetes Sister   . Other Mother   . Other Father    History  Substance Use Topics  . Smoking status: Never Smoker   . Smokeless tobacco: Never Used  . Alcohol Use: No    Review of Systems  Constitutional: Negative for fever and chills.  HENT: Negative for congestion and sore throat.   Respiratory: Negative for cough and shortness of breath.   Cardiovascular: Negative for chest pain and leg swelling.  Gastrointestinal: Negative for nausea, vomiting, abdominal pain, diarrhea and constipation.  Genitourinary: Positive for flank pain. Negative for dysuria and frequency.  Skin: Negative for color change and rash.  Neurological: Negative for dizziness and headaches.  Psychiatric/Behavioral: Negative for confusion and agitation.  All other systems reviewed and are negative.    Allergies  Codeine  Home Medications   Current Outpatient Rx  Name  Route  Sig  Dispense  Refill  . cinacalcet (SENSIPAR) 30 MG tablet   Oral   Take 30 mg by mouth daily.         . folic acid (FOLVITE) 1 MG tablet   Oral   Take 1 tablet (1 mg total)  by mouth daily.         Marland Kitchen oxyCODONE-acetaminophen (PERCOCET/ROXICET) 5-325 MG per tablet   Oral   Take 1-2 tablets by mouth every 6 (six) hours as needed for pain.   30 tablet   0   . sevelamer carbonate (RENVELA) 800 MG tablet   Oral   Take 800 mg by mouth 3 (three) times daily with meals.          There were no vitals taken for this visit. Physical Exam  Constitutional: He is oriented to person, place, and time. He appears well-developed and well-nourished. No distress.  HENT:  Head: Normocephalic and atraumatic.  Eyes: EOM are normal. Pupils are equal, round, and reactive to light.  Neck: Normal range of motion. Neck supple.    Cardiovascular: Normal rate and regular rhythm.   Pulmonary/Chest: Effort normal and breath sounds normal. No respiratory distress.  Abdominal: Soft. He exhibits no distension. There is tenderness. There is no rigidity, no rebound, no guarding, no CVA tenderness, no tenderness at McBurney's point and negative Murphy's sign.    Musculoskeletal: Normal range of motion. He exhibits no edema.  Neurological: He is alert and oriented to person, place, and time.  Skin: Skin is warm and dry.  Psychiatric: He has a normal mood and affect. His behavior is normal.    ED Course  Procedures (including critical care time) Labs Reviewed - No data to display No results found. No diagnosis found. Results for orders placed during the hospital encounter of 04/14/13  CBC WITH DIFFERENTIAL      Result Value Range   WBC 14.9 (*) 4.0 - 10.5 K/uL   RBC 2.12 (*) 4.22 - 5.81 MIL/uL   Hemoglobin 6.5 (*) 13.0 - 17.0 g/dL   HCT 16.1 (*) 09.6 - 04.5 %   MCV 91.5  78.0 - 100.0 fL   MCH 30.7  26.0 - 34.0 pg   MCHC 33.5  30.0 - 36.0 g/dL   RDW 40.9 (*) 81.1 - 91.4 %   Platelets 358  150 - 400 K/uL   Neutrophils Relative % 63  43 - 77 %   Lymphocytes Relative 22  12 - 46 %   Monocytes Relative 7  3 - 12 %   Eosinophils Relative 8 (*) 0 - 5 %   Basophils Relative 0  0 - 1 %   Neutro Abs 9.4 (*) 1.7 - 7.7 K/uL   Lymphs Abs 3.3  0.7 - 4.0 K/uL   Monocytes Absolute 1.0  0.1 - 1.0 K/uL   Eosinophils Absolute 1.2 (*) 0.0 - 0.7 K/uL   Basophils Absolute 0.0  0.0 - 0.1 K/uL   RBC Morphology SICKLE CELLS    RETICULOCYTES      Result Value Range   Retic Ct Pct 9.8 (*) 0.4 - 3.1 %   RBC. 2.12 (*) 4.22 - 5.81 MIL/uL   Retic Count, Manual 207.8 (*) 19.0 - 186.0 K/uL  BASIC METABOLIC PANEL      Result Value Range   Sodium 139  135 - 145 mEq/L   Potassium 4.4  3.5 - 5.1 mEq/L   Chloride 104  96 - 112 mEq/L   CO2 24  19 - 32 mEq/L   Glucose, Bld 92  70 - 99 mg/dL   BUN 48 (*) 6 - 23 mg/dL   Creatinine, Ser 7.82  (*) 0.50 - 1.35 mg/dL   Calcium 9.2  8.4 - 95.6 mg/dL   GFR calc non Af Amer 11 (*) >  90 mL/min   GFR calc Af Amer 13 (*) >90 mL/min  URINALYSIS, ROUTINE W REFLEX MICROSCOPIC      Result Value Range   Color, Urine YELLOW  YELLOW   APPearance CLEAR  CLEAR   Specific Gravity, Urine 1.009  1.005 - 1.030   pH 8.0  5.0 - 8.0   Glucose, UA NEGATIVE  NEGATIVE mg/dL   Hgb urine dipstick MODERATE (*) NEGATIVE   Bilirubin Urine NEGATIVE  NEGATIVE   Ketones, ur NEGATIVE  NEGATIVE mg/dL   Protein, ur >161 (*) NEGATIVE mg/dL   Urobilinogen, UA 0.2  0.0 - 1.0 mg/dL   Nitrite NEGATIVE  NEGATIVE   Leukocytes, UA SMALL (*) NEGATIVE  URINE MICROSCOPIC-ADD ON      Result Value Range   Squamous Epithelial / LPF RARE  RARE   WBC, UA 21-50  <3 WBC/hpf   RBC / HPF 0-2  <3 RBC/hpf   Bacteria, UA RARE  RARE    MDM  Exam as above, well appearing, NAD, abd soft, no clinical peritonitis. No volume overload on exam, normotensive.  Likely vaso-occlusive crisis, doubt acute chest or infectious etiology. Hgb 6.5, HCT 19, WBC 14.9 w/ left shit. retic 9.8, Cr 6.42, BUN 48. U/a w/ WBC and small leuks, nitrite neg, asymptomatic. Likely colonized, will refrain from tx at this time. At this time doubt acute chest syndrome. D/w internal medicine - initial request to transfer to [facility] - d/w Dr Kerby Nora - PALS line - no indication to transfer as transfusion related hemochromatosis is not an acute process. Dr Kerby Nora d/w pts nephrologist (Dr Aron Baba), she has arranged for dialysis and transfusion tomorrow. Pt unable to make appt tomorrow 2/2 lack of transportation. D/w nephrology on call at [facility] and they are amenable to dialysis and transfusion here today. Pt taken to dialysis center in stable condition.   I have personally reviewed labs and considered in my MDM. Case d/w Dr Judd Lien  1. Anemia   2. Vasoocclusive sickle cell crisis      Audelia Hives, MD 04/15/13 [phone]

## 2013-04-15 LAB — URINE CULTURE

## 2013-04-15 LAB — TYPE AND SCREEN: Unit division: 0

## 2013-04-18 NOTE — ED Provider Notes (Signed)
I saw and evaluated the patient, reviewed the resident's note and I agree with the findings and plan.  Patient with pmh of Sickle Cell Disease with ESRD and is on Hemodialysis.  He presents with complaints of pain in the bilateral flanks that is typical of his sickle crisis pain.  He denies any fevers or chills.  He denies n/v/d.  He was at dialysis this morning when the symptoms began.  He had to cut his dialysis short after an hour and was sent here.    On exam, the patient appears well and in no distress.  The vitals are stable and he is afebrile.  The heart is regular rate and rhythm and the lungs are clear.  The abdomen is soft and non-tender, but there is mild bilateral cva ttp.  There is no edema.    The workup reveals a Hb of 6.5 for which I feel he will require transfusion.  This was discussed with the hospitalist who was noted the patient has had a transfusion-associated hemochromatosis in the past.  He did not feel as though he could properly manage the patient should this recur and wanted the patient transferred to [facility] where they are most familiar with his case.  Call to [facility] resulted in recommendations for dialysis with transfusion and arrangements were made for this at [facility].  He will be sent to dialysis for this.  His pain was controlled in the ED and he is feeling much better.  Geoffery Lyons, MD 04/18/13 [phone]

## 2013-05-10 ENCOUNTER — Encounter (HOSPITAL_COMMUNITY): Payer: Self-pay

## 2013-05-10 ENCOUNTER — Emergency Department (HOSPITAL_COMMUNITY)
Admission: EM | Admit: 2013-05-10 | Discharge: 2013-05-10 | Disposition: A | Discharge status: Home / Self Care | Payer: Federal, State, Local not specified - PPO | Attending: Emergency Medicine | Admitting: Emergency Medicine

## 2013-05-10 DIAGNOSIS — N186 End stage renal disease: Secondary | ICD-10-CM | POA: Insufficient documentation

## 2013-05-10 DIAGNOSIS — M549 Dorsalgia, unspecified: Secondary | ICD-10-CM | POA: Insufficient documentation

## 2013-05-10 DIAGNOSIS — I509 Heart failure, unspecified: Secondary | ICD-10-CM | POA: Insufficient documentation

## 2013-05-10 DIAGNOSIS — Z8673 Personal history of transient ischemic attack (TIA), and cerebral infarction without residual deficits: Secondary | ICD-10-CM | POA: Insufficient documentation

## 2013-05-10 DIAGNOSIS — Z79899 Other long term (current) drug therapy: Secondary | ICD-10-CM | POA: Insufficient documentation

## 2013-05-10 DIAGNOSIS — R109 Unspecified abdominal pain: Secondary | ICD-10-CM | POA: Insufficient documentation

## 2013-05-10 DIAGNOSIS — E213 Hyperparathyroidism, unspecified: Secondary | ICD-10-CM | POA: Insufficient documentation

## 2013-05-10 DIAGNOSIS — D5700 Hb-SS disease with crisis, unspecified: Secondary | ICD-10-CM | POA: Insufficient documentation

## 2013-05-10 DIAGNOSIS — Z8659 Personal history of other mental and behavioral disorders: Secondary | ICD-10-CM | POA: Insufficient documentation

## 2013-05-10 DIAGNOSIS — J45909 Unspecified asthma, uncomplicated: Secondary | ICD-10-CM | POA: Insufficient documentation

## 2013-05-10 DIAGNOSIS — Z862 Personal history of diseases of the blood and blood-forming organs and certain disorders involving the immune mechanism: Secondary | ICD-10-CM | POA: Insufficient documentation

## 2013-05-10 DIAGNOSIS — Z992 Dependence on renal dialysis: Secondary | ICD-10-CM | POA: Insufficient documentation

## 2013-05-10 LAB — CBC WITH DIFFERENTIAL/PLATELET
Basophils Absolute: 0 10*3/uL (ref 0.0–0.1)
Eosinophils Absolute: 0.9 10*3/uL — ABNORMAL HIGH (ref 0.0–0.7)
HCT: 24.7 % — ABNORMAL LOW (ref 39.0–52.0)
Lymphs Abs: 3.1 10*3/uL (ref 0.7–4.0)
MCH: 31.3 pg (ref 26.0–34.0)
MCHC: 34.4 g/dL (ref 30.0–36.0)
MCV: 90.8 fL (ref 78.0–100.0)
Monocytes Absolute: 1.2 10*3/uL — ABNORMAL HIGH (ref 0.1–1.0)
Neutro Abs: 11.8 10*3/uL — ABNORMAL HIGH (ref 1.7–7.7)
RDW: 19.2 % — ABNORMAL HIGH (ref 11.5–15.5)

## 2013-05-10 LAB — RETICULOCYTES: Retic Count, Absolute: 258.4 10*3/uL — ABNORMAL HIGH (ref 19.0–186.0)

## 2013-05-10 LAB — POCT I-STAT, CHEM 8
BUN: 56 mg/dL — ABNORMAL HIGH (ref 6–23)
Creatinine, Ser: 7.1 mg/dL — ABNORMAL HIGH (ref 0.50–1.35)
Glucose, Bld: 97 mg/dL (ref 70–99)
Hemoglobin: 8.8 g/dL — ABNORMAL LOW (ref 13.0–17.0)
TCO2: 20 mmol/L (ref 0–100)

## 2013-05-10 MED ORDER — ONDANSETRON HCL 4 MG/2ML IJ SOLN
4.0000 mg | Freq: Once | INTRAMUSCULAR | Status: AC
  Administered 2013-05-10: 4 mg via INTRAVENOUS
  Filled 2013-05-10: qty 2

## 2013-05-10 MED ORDER — HYDROMORPHONE HCL PF 1 MG/ML IJ SOLN
1.0000 mg | Freq: Once | INTRAMUSCULAR | Status: AC
  Administered 2013-05-10: 1 mg via INTRAVENOUS
  Filled 2013-05-10: qty 1

## 2013-05-10 MED ORDER — DIPHENHYDRAMINE HCL 50 MG/ML IJ SOLN
25.0000 mg | Freq: Once | INTRAMUSCULAR | Status: AC
  Administered 2013-05-10: 25 mg via INTRAVENOUS
  Filled 2013-05-10: qty 1

## 2013-05-10 MED ORDER — HYDROMORPHONE HCL PF 1 MG/ML IJ SOLN
1.0000 mg | Freq: Once | INTRAMUSCULAR | Status: DC
  Filled 2013-05-10: qty 1

## 2013-05-10 NOTE — ED Notes (Signed)
Pt states that 30 minutes ago he started having pain to bilateral sides that moved down to buttocks. Denies n/v. Pt reports pain a continuous throb and that this is how his sickle cell crisis starts. No med taken for treatment of pain.

## 2013-05-10 NOTE — ED Provider Notes (Signed)
CSN: [id number]     Arrival date & time 05/10/13  0415 History     First MD Initiated Contact with Patient 05/10/13 [phone]     Chief Complaint  Patient presents with  . Sickle Cell Pain Crisis   (Consider location/radiation/quality/duration/timing/severity/associated sxs/prior Treatment) HPI Hx per PT - ESRD and has SCA, developed onset of his typical sickle cell pains tonight at home, c/o sharp non radiating pains to his bilateral lower flanks and buttocks.  No trauma, no F/C, makes a small amount of urine - no dysuria, urgency or frequency. No F/C, no cough or CP/ SOB. Symptoms mod to severe and persistent despite home pain medications.   Past Medical History  Diagnosis Date  . Sickle cell disease, type SS     frequent pain crises + hx of stroke  . Asthma   . Renal disorder     end stage renal failure  . Stroke 2000    no deficits  . Pica   . Anemia   . Hyperparathyroidism   . Polysubstance abuse   . CHF (congestive heart failure)   . ESRD on dialysis 03/07/2013    Patient has ESRD most likely from sickle cell disease.  He is dialyzed at [facility] in [place], [place]. His renal doctor's are from [facility].  He has a LUA AVF which was first used early June 2014 and has a tunneled HD cath which is supposed to be removed soon.  He started dialysis in July 2013. HD schedule is TTS, 4hrs, dry weight 136 lbs as of June 2014.    . Transfusion associated hemochromatosis    Past Surgical History  Procedure Laterality Date  . Cholecystectomy    . Renal biopsy    . Av fistula placement     Family History  Problem Relation Age of Onset  . Diabetes Sister   . Other Mother   . Other Father    History  Substance Use Topics  . Smoking status: Never Smoker   . Smokeless tobacco: Never Used  . Alcohol Use: No    Review of Systems  Constitutional: Negative for fever and chills.  HENT: Negative for neck pain and neck stiffness.   Eyes: Negative for pain.  Respiratory: Negative  for shortness of breath.   Cardiovascular: Negative for chest pain.  Gastrointestinal: Negative for abdominal pain.  Genitourinary: Negative for dysuria.  Musculoskeletal: Positive for back pain.  Skin: Negative for rash.  Neurological: Negative for headaches.  All other systems reviewed and are negative.    Allergies  Codeine  Home Medications   Current Outpatient Rx  Name  Route  Sig  Dispense  Refill  . cinacalcet (SENSIPAR) 30 MG tablet   Oral   Take 30 mg by mouth every evening.          Marland Kitchen oxyCODONE-acetaminophen (PERCOCET/ROXICET) 5-325 MG per tablet   Oral   Take 1-2 tablets by mouth every 6 (six) hours as needed for pain.   30 tablet   0   . sevelamer carbonate (RENVELA) 800 MG tablet   Oral   Take 800 mg by mouth 3 (three) times daily with meals.          BP 130/81  Pulse 81  Temp(Src) 98.2 F (36.8 C) (Oral)  Resp 18  Ht 5\' 11"  (1.803 m)  Wt 138 lb (62.596 kg)  BMI 19.26 kg/m2  SpO2 97% Physical Exam  Constitutional: He is oriented to person, place, and time. He appears well-developed  and well-nourished.  HENT:  Head: Normocephalic and atraumatic.  Eyes: EOM are normal. Pupils are equal, round, and reactive to light.  Neck: Neck supple.  Cardiovascular: Normal rate, regular rhythm and intact distal pulses.   Pulmonary/Chest: Effort normal and breath sounds normal. No respiratory distress.  Abdominal: Soft. Bowel sounds are normal. He exhibits no distension. There is no tenderness.  Musculoskeletal: Normal range of motion. He exhibits no edema.  No ABD, flank or soft tissue tenderness of the trunk or pelvis. No thoracic. Lumbar deformity/ tenderness. No LE deficits  Neurological: He is alert and oriented to person, place, and time.  Skin: Skin is warm and dry.    ED Course   Procedures (including critical care time)  Labs Reviewed  RETICULOCYTES - Abnormal; Notable for the following:    Retic Ct Pct 9.5 (*)    RBC. 2.72 (*)    Retic Count,  Manual 258.4 (*)    All other components within normal limits  CBC WITH DIFFERENTIAL - Abnormal; Notable for the following:    WBC 17.0 (*)    RBC 2.72 (*)    Hemoglobin 8.5 (*)    HCT 24.7 (*)    RDW 19.2 (*)    Neutro Abs 11.8 (*)    Monocytes Absolute 1.2 (*)    Eosinophils Absolute 0.9 (*)    All other components within normal limits  POCT I-STAT, CHEM 8 - Abnormal; Notable for the following:    BUN 56 (*)    Creatinine, Ser 7.10 (*)    Hemoglobin 8.8 (*)    HCT 26.0 (*)    All other components within normal limits   7:01 AM requesting to be discharged home  Plan f/u PCP, continue home meds as needed, return here for any fevers, return of pain or concerning symptoms.   MDM  Sickle cell pain crisis  imprioved with dilaudid x 1  Labs obtained/ reviewed  VS and nurses notes reviewed  Sunnie Nielsen, MD 05/11/13 [phone]

## 2013-05-23 ENCOUNTER — Inpatient Hospital Stay (HOSPITAL_COMMUNITY)
Admission: EM | Admit: 2013-05-23 | Discharge: 2013-05-24 | DRG: 395 | Disposition: A | Discharge status: Home / Self Care | Payer: Federal, State, Local not specified - PPO | Attending: Internal Medicine | Admitting: Internal Medicine

## 2013-05-23 ENCOUNTER — Emergency Department (HOSPITAL_COMMUNITY): Payer: Federal, State, Local not specified - PPO

## 2013-05-23 ENCOUNTER — Encounter (HOSPITAL_COMMUNITY): Payer: Self-pay | Admitting: Neurology

## 2013-05-23 DIAGNOSIS — F1290 Cannabis use, unspecified, uncomplicated: Secondary | ICD-10-CM | POA: Diagnosis present

## 2013-05-23 DIAGNOSIS — N2581 Secondary hyperparathyroidism of renal origin: Secondary | ICD-10-CM | POA: Diagnosis present

## 2013-05-23 DIAGNOSIS — Z79899 Other long term (current) drug therapy: Secondary | ICD-10-CM

## 2013-05-23 DIAGNOSIS — N186 End stage renal disease: Secondary | ICD-10-CM | POA: Diagnosis present

## 2013-05-23 DIAGNOSIS — R109 Unspecified abdominal pain: Secondary | ICD-10-CM

## 2013-05-23 DIAGNOSIS — Z992 Dependence on renal dialysis: Secondary | ICD-10-CM

## 2013-05-23 DIAGNOSIS — M879 Osteonecrosis, unspecified: Secondary | ICD-10-CM

## 2013-05-23 DIAGNOSIS — F1210 Cannabis abuse, uncomplicated: Secondary | ICD-10-CM | POA: Diagnosis present

## 2013-05-23 DIAGNOSIS — R0902 Hypoxemia: Secondary | ICD-10-CM

## 2013-05-23 DIAGNOSIS — M899 Disorder of bone, unspecified: Secondary | ICD-10-CM | POA: Diagnosis present

## 2013-05-23 DIAGNOSIS — Z8673 Personal history of transient ischemic attack (TIA), and cerebral infarction without residual deficits: Secondary | ICD-10-CM

## 2013-05-23 DIAGNOSIS — M79672 Pain in left foot: Secondary | ICD-10-CM

## 2013-05-23 DIAGNOSIS — R7989 Other specified abnormal findings of blood chemistry: Secondary | ICD-10-CM

## 2013-05-23 DIAGNOSIS — I120 Hypertensive chronic kidney disease with stage 5 chronic kidney disease or end stage renal disease: Secondary | ICD-10-CM | POA: Diagnosis present

## 2013-05-23 DIAGNOSIS — D571 Sickle-cell disease without crisis: Secondary | ICD-10-CM

## 2013-05-23 DIAGNOSIS — D5700 Hb-SS disease with crisis, unspecified: Principal | ICD-10-CM | POA: Diagnosis present

## 2013-05-23 DIAGNOSIS — E875 Hyperkalemia: Secondary | ICD-10-CM

## 2013-05-23 LAB — COMPREHENSIVE METABOLIC PANEL
AST: 38 U/L — ABNORMAL HIGH (ref 0–37)
CO2: 23 mEq/L (ref 19–32)
Calcium: 9.1 mg/dL (ref 8.4–10.5)
Creatinine, Ser: 5.13 mg/dL — ABNORMAL HIGH (ref 0.50–1.35)
GFR calc Af Amer: 17 mL/min — ABNORMAL LOW (ref >90)
GFR calc non Af Amer: 14 mL/min — ABNORMAL LOW (ref >90)
Total Protein: 7.4 g/dL (ref 6.0–8.3)

## 2013-05-23 LAB — CBC WITH DIFFERENTIAL/PLATELET
Basophils Absolute: 0 10*3/uL (ref 0.0–0.1)
Basophils Relative: 0 % (ref 0–1)
Eosinophils Absolute: 0.7 10*3/uL (ref 0.0–0.7)
Hemoglobin: 5.5 g/dL — CL (ref 13.0–17.0)
MCH: 30.6 pg (ref 26.0–34.0)
MCHC: 35.3 g/dL (ref 30.0–36.0)
Monocytes Absolute: 1.1 10*3/uL — ABNORMAL HIGH (ref 0.1–1.0)
Neutrophils Relative %: 68 % (ref 43–77)
Platelets: 443 10*3/uL — ABNORMAL HIGH (ref 150–400)
RDW: 19.1 % — ABNORMAL HIGH (ref 11.5–15.5)

## 2013-05-23 LAB — RETICULOCYTES
RBC.: 1.8 MIL/uL — ABNORMAL LOW (ref 4.22–5.81)
Retic Count, Absolute: 232.2 10*3/uL — ABNORMAL HIGH (ref 19.0–186.0)

## 2013-05-23 LAB — PREPARE RBC (CROSSMATCH)

## 2013-05-23 LAB — MAGNESIUM: Magnesium: 2.1 mg/dL (ref 1.5–2.5)

## 2013-05-23 LAB — PHOSPHORUS: Phosphorus: 3.7 mg/dL (ref 2.3–4.6)

## 2013-05-23 MED ORDER — DIPHENHYDRAMINE HCL 25 MG PO CAPS: 25.0000 mg | ORAL_CAPSULE | ORAL | Status: DC | PRN

## 2013-05-23 MED ORDER — SENNOSIDES-DOCUSATE SODIUM 8.6-50 MG PO TABS
1.0000 | ORAL_TABLET | Freq: Two times a day (BID) | ORAL | Status: DC
  Administered 2013-05-23 – 2013-05-24 (×3): 1 via ORAL
  Filled 2013-05-23 (×4): qty 1

## 2013-05-23 MED ORDER — FOLIC ACID 1 MG PO TABS
1.0000 mg | ORAL_TABLET | Freq: Every day | ORAL | Status: DC
  Administered 2013-05-23 – 2013-05-24 (×2): 1 mg via ORAL
  Filled 2013-05-23 (×2): qty 1

## 2013-05-23 MED ORDER — ONDANSETRON HCL 4 MG PO TABS: 4.0000 mg | ORAL_TABLET | ORAL | Status: DC | PRN

## 2013-05-23 MED ORDER — SEVELAMER CARBONATE 800 MG PO TABS
800.0000 mg | ORAL_TABLET | Freq: Three times a day (TID) | ORAL | Status: DC
Start: 2013-05-23 — End: 2013-05-24
  Administered 2013-05-23 – 2013-05-24 (×3): 800 mg via ORAL
  Filled 2013-05-23 (×6): qty 1

## 2013-05-23 MED ORDER — OXYCODONE-ACETAMINOPHEN 5-325 MG PO TABS
1.0000 | ORAL_TABLET | Freq: Four times a day (QID) | ORAL | Status: DC | PRN
  Administered 2013-05-23: 2 via ORAL
  Administered 2013-05-23: 1 via ORAL
  Filled 2013-05-23: qty 2
  Filled 2013-05-23: qty 1

## 2013-05-23 MED ORDER — ONDANSETRON HCL 4 MG/2ML IJ SOLN
4.0000 mg | Freq: Once | INTRAMUSCULAR | Status: AC
  Administered 2013-05-23: 4 mg via INTRAVENOUS
  Filled 2013-05-23: qty 2

## 2013-05-23 MED ORDER — HYDROMORPHONE HCL PF 1 MG/ML IJ SOLN
1.0000 mg | INTRAMUSCULAR | Status: DC | PRN
  Administered 2013-05-23: 1 mg via INTRAVENOUS
  Administered 2013-05-23: 2 mg via INTRAVENOUS
  Administered 2013-05-24 (×2): 1 mg via INTRAVENOUS
  Filled 2013-05-23: qty 2
  Filled 2013-05-23: qty 1
  Filled 2013-05-23 (×2): qty 2

## 2013-05-23 MED ORDER — HEPARIN SODIUM (PORCINE) 5000 UNIT/ML IJ SOLN
5000.0000 [IU] | Freq: Three times a day (TID) | INTRAMUSCULAR | Status: DC
  Filled 2013-05-23 (×6): qty 1

## 2013-05-23 MED ORDER — SODIUM CHLORIDE 0.9 % IJ SOLN
3.0000 mL | Freq: Two times a day (BID) | INTRAMUSCULAR | Status: DC
  Administered 2013-05-23 – 2013-05-24 (×2): 3 mL via INTRAVENOUS

## 2013-05-23 MED ORDER — SODIUM CHLORIDE 0.9 % IJ SOLN
3.0000 mL | INTRAMUSCULAR | Status: DC | PRN
  Administered 2013-05-23 – 2013-05-24 (×2): 3 mL via INTRAVENOUS

## 2013-05-23 MED ORDER — DIPHENHYDRAMINE HCL 50 MG/ML IJ SOLN: 12.5000 mg | INTRAMUSCULAR | Status: DC | PRN

## 2013-05-23 MED ORDER — ONDANSETRON HCL 4 MG/2ML IJ SOLN: 4.0000 mg | INTRAMUSCULAR | Status: DC | PRN

## 2013-05-23 MED ORDER — SODIUM CHLORIDE 0.9 % IV SOLN: 250.0000 mL | INTRAVENOUS | Status: DC | PRN

## 2013-05-23 MED ORDER — CINACALCET HCL 30 MG PO TABS
30.0000 mg | ORAL_TABLET | Freq: Every day | ORAL | Status: DC
  Administered 2013-05-24: 30 mg via ORAL
  Filled 2013-05-23 (×2): qty 1

## 2013-05-23 MED ORDER — HYDROMORPHONE HCL PF 2 MG/ML IJ SOLN
2.0000 mg | Freq: Once | INTRAMUSCULAR | Status: AC
  Administered 2013-05-23: 2 mg via INTRAVENOUS
  Filled 2013-05-23: qty 1

## 2013-05-23 NOTE — Consult Note (Signed)
[facility] Renal Consultation Note    Indication for Consultation:  Management of ESRD/hemodialysis; anemia, hypertension/volume and secondary hyperparathyroidism  HPI: Philip Rios is a 27 y.o. male ESRD patient (TTS HD @ [facility]) with past medical history significant for Sickle Cell SS, CVA and polysubstance use who signed off of his outpatient hemodialysis session today secondary to bilateral flank pain, similar to his usual sickle cell pain.  It began on Thursday and he was able to tolerate it on home doses of percocet.  However, the pain became unbearable after about an hour of hemodialysis today and he was subsequently transported by EMS to the [facility] ED.  He endorses bilateral flank pain but denies headache, dizziness, fever, chills, nausea, vomiting or diarrhea. His chest xray is clear and potassium is within limits, however he is profoundly anemic with a hemoglobin of 5.5 on outpatient Aranesp. He will be admitted for blood transfusion and pain management. He is followed by [facility].  Past Medical History  Diagnosis Date  . Sickle cell disease, type SS     frequent pain crises + hx of stroke  . Asthma   . Renal disorder     end stage renal failure  . Stroke 2000    no deficits  . Pica   . Anemia   . Hyperparathyroidism   . Polysubstance abuse   . CHF (congestive heart failure)   . ESRD on dialysis 03/07/2013    Patient has ESRD most likely from sickle cell disease.  He is dialyzed at [facility] in [place], [place]. His renal doctor's are from [facility].  He has a LUA AVF which was first used early June 2014 and has a tunneled HD cath which is supposed to be removed soon.  He started dialysis in July 2013. HD schedule is TTS, 4hrs, dry weight 136 lbs as of June 2014.    . Transfusion associated hemochromatosis    Past Surgical History  Procedure Laterality Date  . Cholecystectomy    . Renal biopsy    . Av fistula placement     Family History  Problem Relation  Age of Onset  . Diabetes Sister   . Other Mother   . Other Father    Social History:  reports that he has never smoked. He has never used smokeless tobacco. He reports that he uses illicit drugs (Marijuana) about 7 times per week. He reports that he does not drink alcohol. Allergies  Allergen Reactions  . Codeine Hives   Prior to Admission medications   Medication Sig Start Date End Date Taking? Authorizing Provider  cinacalcet (SENSIPAR) 30 MG tablet Take 30 mg by mouth every evening.    Yes Historical Provider, MD  oxyCODONE-acetaminophen (PERCOCET/ROXICET) 5-325 MG per tablet Take 1-2 tablets by mouth every 6 (six) hours as needed for pain. 03/22/13  Yes Renae Fickle, MD  sevelamer carbonate (RENVELA) 800 MG tablet Take 800 mg by mouth 3 (three) times daily with meals.   Yes Historical Provider, MD   Current Facility-Administered Medications  Medication Dose Route Frequency Provider Last Rate Last Dose  . HYDROmorphone (DILAUDID) injection 1-2 mg  1-2 mg Intravenous Q2H PRN Christiane Ha, MD      . oxyCODONE-acetaminophen (PERCOCET/ROXICET) 5-325 MG per tablet 1-2 tablet  1-2 tablet Oral Q6H PRN Christiane Ha, MD   1 tablet at 05/23/13 1134   Current Outpatient Prescriptions  Medication Sig Dispense Refill  . cinacalcet (SENSIPAR) 30 MG tablet Take 30 mg by mouth every  evening.       Marland Kitchen oxyCODONE-acetaminophen (PERCOCET/ROXICET) 5-325 MG per tablet Take 1-2 tablets by mouth every 6 (six) hours as needed for pain.  30 tablet  0  . sevelamer carbonate (RENVELA) 800 MG tablet Take 800 mg by mouth 3 (three) times daily with meals.       Labs: Basic Metabolic Panel:  Recent Labs Lab 05/23/13 0900  NA 135  K 3.8  CL 101  CO2 23  GLUCOSE 97  BUN 31*  CREATININE 5.13*  CALCIUM 9.1   Liver Function Tests:  Recent Labs Lab 05/23/13 0900  AST 38*  ALT 10  ALKPHOS 68  BILITOT 4.1*  PROT 7.4  ALBUMIN 3.2*   CBC:  Recent Labs Lab 05/23/13 0900  WBC 14.2*   NEUTROABS 9.7*  HGB 5.5*  HCT 15.6*  MCV 86.7  PLT 443*   Cardiac Enzymes:  Recent Labs Lab 05/23/13 0900  TROPONINI <0.30   CBG: No results found for this basename: GLUCAP,  in the last 168 hours INR: @labcntip (inr:3)@ Iron Studies: No results found for this basename: IRON, TIBC, TRANSFERRIN, FERRITIN,  in the last 72 hours Studies/Results: Dg Chest 2 View  05/23/2013   *RADIOLOGY REPORT*  Clinical Data: Sickle cell crisis with pain.  CHEST - 2 VIEW  Comparison: 11/15/2012  Findings: The cardiac silhouette is mildly enlarged.  The mediastinum is normal in contour caliber.  There are no hilar masses.  The lungs are clear.  No pleural effusion or pneumothorax.  The bony thorax and surrounding soft tissues are unremarkable.  IMPRESSION: No acute cardiopulmonary disease.   Original Report Authenticated By: Amie Portland, M.D.    ROS: Bilateral flank pain. All other systems negative except as noted above.   Physical Exam: Filed Vitals:   05/23/13 1000 05/23/13 1030 05/23/13 1047 05/23/13 1122  BP: 126/78 122/82 141/82 121/77  Pulse: 81 81 69 64  Temp:    98.1 F (36.7 C)  TempSrc:    Oral  Resp:    16  SpO2: 94% 98% 100% 100%     General: Thin, AAM, cooperative, in no acute distress. Head: Normocephalic, atraumatic, icteric sclera, mucus membranes are moist Neck: Supple. JVD not elevated. Lungs: Clear bilaterally to auscultation without wheezes, rales, or rhonchi. Breathing is unlabored. Heart: RRR with S1 S2. No murmurs, rubs, or gallops appreciated. Abdomen: Soft, non-tender, non-distended with normoactive bowel sounds. No rebound/guarding. No obvious abdominal masses. M-S:  Strength and tone appear normal for age. Lower extremities:without edema or ischemic changes, no open wounds, dry skin Neuro: Alert and oriented X 3. Moves all extremities spontaneously. Psych:  Responds to questions appropriately with a normal affect. Dialysis Access: LUA AVF +T/B (placed 12/2011 by  Dr Everardo Beals)  Dialysis Orders: Center: [facility]  on TTS. EDW 138 lbs/ ~63kg HD Bath 2K/2.5Ca  Time 4:00 Heparin 3700 u. Access LUA AVF  Hectorol 4 mcg IV/HD Aranesp 120 mcg weekly  Venofer  0 Other: Leaving under EDW last few treatments  Assessment/Plan: 1. Severe Anemia - Per Admit.  Hgb 5.5 in the setting of Sickle Cell SS. Plan is to transfuse 1 unit PRBCs. He is on op Aranesp 120 mcg weekly, which we can continue here with next HD. 2. Sickle cell crisis - Mgmt per admit. 3. ESRD -  TTS at [facility]. K+ 3.8. Got approx 45 min of treatment today. 4. Hypertension/volume  - SBPs 120s-140s. No BP meds here. Was under op EDW by ~1kg today and has recently been under by more.  Lungs are clear. No JVD or edema. No urgent need for additional HD today. Next HD Monday or Tuesday depending on volume status and labs. Probably needs a lower EDW. 5. Metabolic bone disease -  Corrected Ca 9.7. Continue renvela 1 ac. Add Vit D with next HD. Review op records  6. Nutrition - Alb 3.2, renal diet, multivitamin 7. Polysubstance use - Current marijuana use in addition to narcotic management of sickle cell pain  Claud Kelp, PA-C [facility] Pager [phone] 05/23/2013, 12:03 PM

## 2013-05-23 NOTE — ED Notes (Signed)
Patient resting in bed watching TV in room. Lights dimmed. Repositioning offered. Call light within reach. Vital signs within normal limits. Will continue to monitor.

## 2013-05-23 NOTE — H&P (Signed)
[facility] History and Physical  Aqil Goetting ZOX:[id number] DOB: Jul 04, 1986 DOA: 05/23/2013  Referring physician: Oletta Cohn PCP: Dorrene German, MD   Chief Complaint: pain  HPI: Philip Rios is a 27 y.o. male with sickle cell disease and end-stage renal failure who presents with bilateral flank pain. He was on dialysis and could only complete an hour, as pain began to worsen so he presented to the emergency room. He noticed that his pain started increasing earlier in this week, but this morning was particularly bad. In the emergency room, he has received 2 mg of Dilaudid which has improved the pain, but it has starting to come back. Also, his hemoglobin is 5.5. He reports that usually, when his hemoglobin drops below 6 he is transfused. He is followed by Dr. Concepcion Elk for his medical problems and is currently trying to establish care with a sickle cell specialist at [facility]. His nephrologist is [facility]. He reports that he usually requires a transfusion every 2 or 3 months. He has had no fevers chills cough dyspnea. His pain is typical for his usual pain crises. He is chronically elevated bilirubin and has transfusion-related hemochromatosis.  Review of Systems: Systems reviewed and as above otherwise negative per  Past Medical History  Diagnosis Date  . Sickle cell disease, type SS     frequent pain crises + hx of stroke  . Asthma   . Renal disorder     end stage renal failure  . Stroke 2000    no deficits  . Pica   . Anemia   . Hyperparathyroidism   . Polysubstance abuse   . CHF (congestive heart failure)   . ESRD on dialysis 03/07/2013    Patient has ESRD most likely from sickle cell disease.  He is dialyzed at [facility] in [place], [place]. His renal doctor's are from [facility].  He has a LUA AVF which was first used early June 2014 and has a tunneled HD cath which is supposed to be removed soon.  He started dialysis in July 2013. HD schedule is TTS, 4hrs, dry weight 136 lbs as  of June 2014.    . Transfusion associated hemochromatosis    Past Surgical History  Procedure Laterality Date  . Cholecystectomy    . Renal biopsy    . Av fistula placement     Social History:  reports that he has never smoked. He has never used smokeless tobacco. He reports that he uses illicit drugs (Marijuana) about 7 times per week. He reports that he does not drink alcohol. currently living with his grandmother  Allergies  Allergen Reactions  . Codeine Hives    Family History  Problem Relation Age of Onset  . Diabetes Sister   . Other Mother   . Other Father     Prior to Admission medications   Medication Sig Start Date End Date Taking? Authorizing Provider  cinacalcet (SENSIPAR) 30 MG tablet Take 30 mg by mouth every evening.    Yes Historical Provider, MD  oxyCODONE-acetaminophen (PERCOCET/ROXICET) 5-325 MG per tablet Take 1-2 tablets by mouth every 6 (six) hours as needed for pain. 03/22/13  Yes Renae Fickle, MD  sevelamer carbonate (RENVELA) 800 MG tablet Take 800 mg by mouth 3 (three) times daily with meals.   Yes Historical Provider, MD   Physical Exam: Filed Vitals:   05/23/13 1047  BP: 141/82  Pulse: 69  Temp:   Resp:    BP 121/77  Pulse 64  Temp(Src) 98.1 F (36.7 C) (  Oral)  Resp 16  SpO2 100%  General Appearance:    Alert, cooperative, no distress, appears stated age  Head:    Normocephalic, without obvious abnormality, atraumatic  Eyes:     scleral icterus present. Pupils equal round react to light       Nose:   Nares normal, septum midline, mucosa normal, no drainage   or sinus tenderness  Throat:  dry mucous membranes   Neck:   Supple, symmetrical, trachea midline, no adenopathy;       thyroid:  No enlargement/tenderness/nodules; no carotid   bruit or JVD  Back:     Symmetric, no curvature, ROM normal, no CVA tenderness  Lungs:     Clear to auscultation bilaterally, respirations unlabored  Chest wall:    No tenderness or deformity  Heart:     Regular rate and rhythm, S1 and S2 normal, no murmur, rub   or gallop  Abdomen:     Soft, non-tender, bowel sounds active all four quadrants,    no masses, no organomegaly  Genitalia:    deferred   Rectal:    deferred   Extremities:   Extremities normal, atraumatic, no cyanosis or edema  Pulses:   2+ and symmetric all extremities  Skin:   dry   Lymph nodes:   Cervical, supraclavicular, and axillary nodes normal  Neurologic:   CNII-XII intact. Normal strength, sensation and reflexes      throughout    psychiatric: Normal affect. Calm and cooperative.   Labs on Admission:  Basic Metabolic Panel:  Recent Labs Lab 05/23/13 0900  NA 135  K 3.8  CL 101  CO2 23  GLUCOSE 97  BUN 31*  CREATININE 5.13*  CALCIUM 9.1   Liver Function Tests:  Recent Labs Lab 05/23/13 0900  AST 38*  ALT 10  ALKPHOS 68  BILITOT 4.1*  PROT 7.4  ALBUMIN 3.2*   No results found for this basename: LIPASE, AMYLASE,  in the last 168 hours No results found for this basename: AMMONIA,  in the last 168 hours CBC:  Recent Labs Lab 05/23/13 0900  WBC 14.2*  NEUTROABS 9.7*  HGB 5.5*  HCT 15.6*  MCV 86.7  PLT 443*   Cardiac Enzymes:  Recent Labs Lab 05/23/13 0900  TROPONINI <0.30    BNP (last 3 results) No results found for this basename: PROBNP,  in the last 8760 hours CBG: No results found for this basename: GLUCAP,  in the last 168 hours  Radiological Exams on Admission: Dg Chest 2 View  05/23/2013   *RADIOLOGY REPORT*  Clinical Data: Sickle cell crisis with pain.  CHEST - 2 VIEW  Comparison: 11/15/2012  Findings: The cardiac silhouette is mildly enlarged.  The mediastinum is normal in contour caliber.  There are no hilar masses.  The lungs are clear.  No pleural effusion or pneumothorax.  The bony thorax and surrounding soft tissues are unremarkable.  IMPRESSION: No acute cardiopulmonary disease.   Original Report Authenticated By: Amie Portland, M.D.   Assessment/Plan    Sickle cell  crisis with severe anemia.  Will admit for transfusion of 1 unit PRBC, pain medication. Will defer any IV fluid to nephrology. He does still make urine, but is dialysis dependent    ESRD on dialysis: Have consulted Dr. Kathrene Bongo.  Code Status: full Family Communication: none Disposition Plan: home  Time spent: 85   Christiane Ha [facility] Pager [phone]  If 7PM-7AM, please contact night-coverage [email or web address] Password [facility] 05/23/2013, 11:15 AM

## 2013-05-23 NOTE — Consult Note (Signed)
I have seen and examined this patient and agree with plan as outlined by Claud Kelp, PA-C.  Pt was unable to stay on for his full HD treatment at [facility] and was sent to [facility] via EMS for sickle crisis.  We were asked to help manage his ESRD needs while he remains an inpt.  He reports some improvement of his pain which is better controlled with dilaudid as percocets only make him sleep.  We will cont with HD qTTS while he remains an inpt here. Annalisa Colonna A,MD 05/23/2013 2:13 PM

## 2013-05-23 NOTE — ED Provider Notes (Addendum)
CSN: [id number]     Arrival date & time 05/23/13  0102 History     First MD Initiated Contact with Patient 05/23/13 0818     Chief Complaint  Patient presents with  . Sickle Cell Pain Crisis   (Consider location/radiation/quality/duration/timing/severity/associated sxs/prior Treatment) HPI Comments: Patient presents to the ER for admission of sickle cell pain. Patient reports that he was at dialysis when the pain began. He only completed approximately one hour of dissection. Patient reports pain around his lower ribs around into his back. This is typical for him. He has not had any fever, cough or shortness of breath. Pain radiates down into his lower back. He did not take any pain medication prior to coming to the ER. He rates the pain at 6.5/10.  Patient is a 27 y.o. male presenting with sickle cell pain.  Sickle Cell Pain Crisis Associated symptoms: chest pain   Associated symptoms: no fever and no shortness of breath     Past Medical History  Diagnosis Date  . Sickle cell disease, type SS     frequent pain crises + hx of stroke  . Asthma   . Renal disorder     end stage renal failure  . Stroke 2000    no deficits  . Pica   . Anemia   . Hyperparathyroidism   . Polysubstance abuse   . CHF (congestive heart failure)   . ESRD on dialysis 03/07/2013    Patient has ESRD most likely from sickle cell disease.  He is dialyzed at [facility] in [place], [place]. His renal doctor's are from [facility].  He has a LUA AVF which was first used early June 2014 and has a tunneled HD cath which is supposed to be removed soon.  He started dialysis in July 2013. HD schedule is TTS, 4hrs, dry weight 136 lbs as of June 2014.    . Transfusion associated hemochromatosis    Past Surgical History  Procedure Laterality Date  . Cholecystectomy    . Renal biopsy    . Av fistula placement     Family History  Problem Relation Age of Onset  . Diabetes Sister   . Other Mother   . Other Father     History  Substance Use Topics  . Smoking status: Never Smoker   . Smokeless tobacco: Never Used  . Alcohol Use: No    Review of Systems  Constitutional: Negative for fever.  Respiratory: Negative for shortness of breath.   Cardiovascular: Positive for chest pain.  Gastrointestinal: Negative for abdominal pain.  All other systems reviewed and are negative.    Allergies  Codeine  Home Medications   Current Outpatient Rx  Name  Route  Sig  Dispense  Refill  . cinacalcet (SENSIPAR) 30 MG tablet   Oral   Take 30 mg by mouth every evening.          Marland Kitchen oxyCODONE-acetaminophen (PERCOCET/ROXICET) 5-325 MG per tablet   Oral   Take 1-2 tablets by mouth every 6 (six) hours as needed for pain.   30 tablet   0   . sevelamer carbonate (RENVELA) 800 MG tablet   Oral   Take 800 mg by mouth 3 (three) times daily with meals.          BP 144/89  Pulse 77  Temp(Src) 98.3 F (36.8 C) (Oral)  Resp 16  SpO2 96% Physical Exam  Constitutional: He is oriented to person, place, and time. He appears well-developed and  well-nourished. No distress.  HENT:  Head: Normocephalic and atraumatic.  Right Ear: Hearing normal.  Left Ear: Hearing normal.  Nose: Nose normal.  Mouth/Throat: Oropharynx is clear and moist and mucous membranes are normal.  Eyes: Conjunctivae and EOM are normal. Pupils are equal, round, and reactive to light.  Neck: Normal range of motion. Neck supple.  Cardiovascular: Regular rhythm, S1 normal and S2 normal.  Exam reveals no gallop and no friction rub.   No murmur heard. Pulmonary/Chest: Effort normal and breath sounds normal. No respiratory distress. He exhibits no tenderness.  Abdominal: Soft. Normal appearance and bowel sounds are normal. There is no hepatosplenomegaly. There is no tenderness. There is no rebound, no guarding, no tenderness at McBurney's point and negative Murphy's sign. No hernia.  Musculoskeletal: Normal range of motion.  Neurological: He  is alert and oriented to person, place, and time. He has normal strength. No cranial nerve deficit or sensory deficit. Coordination normal. GCS eye subscore is 4. GCS verbal subscore is 5. GCS motor subscore is 6.  Skin: Skin is warm, dry and intact. No rash noted. No cyanosis.  Psychiatric: He has a normal mood and affect. His speech is normal and behavior is normal. Thought content normal.    ED Course   Procedures (including critical care time)  EKG:  Date: 05/23/2013  Rate: 71  Rhythm: normal sinus rhythm  QRS Axis: normal  Intervals: normal  ST/T Wave abnormalities: normal  Conduction Disutrbances:none  Narrative Interpretation:   Old EKG Reviewed: unchanged    Labs Reviewed  CBC WITH DIFFERENTIAL - Abnormal; Notable for the following:    WBC 14.2 (*)    RBC 1.80 (*)    Hemoglobin 5.5 (*)    HCT 15.6 (*)    RDW 19.1 (*)    Platelets 443 (*)    All other components within normal limits  COMPREHENSIVE METABOLIC PANEL - Abnormal; Notable for the following:    BUN 31 (*)    Creatinine, Ser 5.13 (*)    Albumin 3.2 (*)    Total Bilirubin 4.1 (*)    GFR calc non Af Amer 14 (*)    GFR calc Af Amer 17 (*)    All other components within normal limits  RETICULOCYTES - Abnormal; Notable for the following:    Retic Ct Pct 12.9 (*)    RBC. 1.80 (*)    Retic Count, Manual 232.2 (*)    All other components within normal limits  TROPONIN I   Dg Chest 2 View  05/23/2013   *RADIOLOGY REPORT*  Clinical Data: Sickle cell crisis with pain.  CHEST - 2 VIEW  Comparison: 11/15/2012  Findings: The cardiac silhouette is mildly enlarged.  The mediastinum is normal in contour caliber.  There are no hilar masses.  The lungs are clear.  No pleural effusion or pneumothorax.  The bony thorax and surrounding soft tissues are unremarkable.  IMPRESSION: No acute cardiopulmonary disease.   Original Report Authenticated By: Amie Portland, M.D.   Diagnosis: 1. Acute sickle cell crisis 2. Anemia  MDM   She presents to the ER for evaluation of pain across both ribs anteriorly, radiating around to the back. Patient reports that this is consistent with the start of atypical crisis for him. He has not had fever, cough or shortness of breath. No concern for acute chest syndrome at this time. Patient was administered Dilaudid 2 mg IV and has had significant improvement in his pain. His brother, however show significant anemia with a  hemoglobin of 5.5. Based on this and the fact that he is a dialysis patient, did not finish his dialysis today, will ask the hospitalist to evaluate the patient for further management (i.e. Hospitalization vs. Outpatient management with close follow-up).  Gilda Crease, MD 05/23/13 1005  Gilda Crease, MD 05/23/13 1024

## 2013-05-23 NOTE — ED Notes (Signed)
Per EMS pt comes from dialysis where he was an hour into his treatment c/o bilateral rib pain consistent with his sickle cell pain. Pt is a x 4 was ambulatory into ER. VS stable.

## 2013-05-23 NOTE — ED Notes (Signed)
Patient alert and oriented, sitting up in bed eating crackers and apple juice. Bed assignment pending. Call bell within reach. Will continue to monitor patient.

## 2013-05-23 NOTE — Progress Notes (Signed)
Patient admitted to room [phone]  Alert and oriented VSS , oriented to room  No respiratory distress noted pain level about a 4/ patient

## 2013-05-24 DIAGNOSIS — F1210 Cannabis abuse, uncomplicated: Secondary | ICD-10-CM

## 2013-05-24 LAB — BASIC METABOLIC PANEL
BUN: 34 mg/dL — ABNORMAL HIGH (ref 6–23)
Chloride: 102 mEq/L (ref 96–112)
Creatinine, Ser: 6.02 mg/dL — ABNORMAL HIGH (ref 0.50–1.35)
GFR calc Af Amer: 14 mL/min — ABNORMAL LOW (ref >90)

## 2013-05-24 LAB — CBC
HCT: 21.7 % — ABNORMAL LOW (ref 39.0–52.0)
MCHC: 35 g/dL (ref 30.0–36.0)
MCV: 87.9 fL (ref 78.0–100.0)
RDW: 18.7 % — ABNORMAL HIGH (ref 11.5–15.5)
WBC: 15.9 10*3/uL — ABNORMAL HIGH (ref 4.0–10.5)

## 2013-05-24 LAB — TYPE AND SCREEN
Antibody Screen: NEGATIVE
Unit division: 0

## 2013-05-24 MED ORDER — OXYCODONE-ACETAMINOPHEN 5-325 MG PO TABS: 1.0000 | ORAL_TABLET | Freq: Four times a day (QID) | ORAL | Status: DC | PRN

## 2013-05-24 MED ORDER — FOLIC ACID 1 MG PO TABS: 1.0000 mg | ORAL_TABLET | Freq: Every day | ORAL | Status: DC

## 2013-05-24 NOTE — Discharge Summary (Signed)
Physician Discharge Summary  Philip Rios WUJ:[id number] DOB: September 24, 1986 DOA: 05/23/2013  PCP: Dorrene German, MD  Admit date: 05/23/2013 Discharge date: 05/24/2013  Time spent: greater than 30 minutes  Recommendations for Outpatient Follow-up:  1. Monitor CBC  Discharge Diagnoses:  Active Problems:   Marijuana use   Sickle cell crisis   ESRD on dialysis Sickle cell anemia  Discharge Condition: stable  Filed Weights   05/23/13 1219 05/23/13 2217  Weight: 60 kg (132 lb 4.4 oz) 60.419 kg (133 lb 3.2 oz)    History of present illness: Philip Rios is a 27 y.o. male with sickle cell disease and end-stage renal failure who presents with bilateral flank pain. He was on dialysis and could only complete an hour, as pain began to worsen so he presented to the emergency room. He noticed that his pain started increasing earlier in this week, but this morning was particularly bad. In the emergency room, he has received 2 mg of Dilaudid which has improved the pain, but it has starting to come back. Also, his hemoglobin is 5.5. He reports that usually, when his hemoglobin drops below 6 he is transfused. He is followed by Dr. Concepcion Elk for his medical problems and is currently trying to establish care with a sickle cell specialist at [facility]. His nephrologist is [facility]. He reports that he usually requires a transfusion every 2 or 3 months. He has had no fevers chills cough dyspnea. His pain is typical for his usual pain crises. He is chronically elevated bilirubin and has transfusion-related hemochromatosis.  Hospital Course:  Transfused 1 unit PRBC.  Nephrogy consulted, but patient did not require dialysis.  Pain medications given.  Patient reports he has a prescription for hydroxyurea, but has not filled it.  I encouraged him to fill the RX and have given him a prescription for folic acid.  His pain was manageable, and patient requesting discharge.  He has an appointment with his hematologist at  [facility] already scheduled.  Consultations:  nephrology  Discharge Exam: Filed Vitals:   05/24/13 0457  BP: 127/75  Pulse: 70  Temp: 98.5 F (36.9 C)  Resp: 18    General: comfortable Cardiovascular: RRR Respiratory: CTA  Discharge Instructions  Discharge Orders   Future Orders Complete By Expires   Discharge instructions  As directed    Comments:     Renal diet       Medication List         cinacalcet 30 MG tablet  Commonly known as:  SENSIPAR  Take 30 mg by mouth every evening.     folic acid 1 MG tablet  Commonly known as:  FOLVITE  Take 1 tablet (1 mg total) by mouth daily.     oxyCODONE-acetaminophen 5-325 MG per tablet  Commonly known as:  PERCOCET/ROXICET  Take 1-2 tablets by mouth every 6 (six) hours as needed for pain.     sevelamer carbonate 800 MG tablet  Commonly known as:  RENVELA  Take 800 mg by mouth 3 (three) times daily with meals.       Allergies  Allergen Reactions  . Codeine Hives       Follow-up Information   Follow up with Daneen Schick. (as previously scheduled)    Specialty:  Hematology and Oncology   Contact information:   [address] [place] [address]        The results of significant diagnostics from this hospitalization (including imaging, microbiology, ancillary and laboratory) are listed below for  reference.    Significant Diagnostic Studies: Dg Chest 2 View  05/23/2013   *RADIOLOGY REPORT*  Clinical Data: Sickle cell crisis with pain.  CHEST - 2 VIEW  Comparison: 11/15/2012  Findings: The cardiac silhouette is mildly enlarged.  The mediastinum is normal in contour caliber.  There are no hilar masses.  The lungs are clear.  No pleural effusion or pneumothorax.  The bony thorax and surrounding soft tissues are unremarkable.  IMPRESSION: No acute cardiopulmonary disease.   Original Report Authenticated By: Amie Portland, M.D.    Microbiology: No results found for this or any previous visit  (from the past 240 hour(s)).   Labs: Basic Metabolic Panel:  Recent Labs Lab 05/23/13 0900 05/23/13 1148 05/24/13 0430  NA 135  --  137  K 3.8  --  4.6  CL 101  --  102  CO2 23  --  28  GLUCOSE 97  --  95  BUN 31*  --  34*  CREATININE 5.13* 5.17* 6.02*  CALCIUM 9.1  --  9.5  MG  --  2.1  --   PHOS  --  3.7  --    Liver Function Tests:  Recent Labs Lab 05/23/13 0900  AST 38*  ALT 10  ALKPHOS 68  BILITOT 4.1*  PROT 7.4  ALBUMIN 3.2*   No results found for this basename: LIPASE, AMYLASE,  in the last 168 hours No results found for this basename: AMMONIA,  in the last 168 hours CBC:  Recent Labs Lab 05/23/13 0900 05/24/13 0430  WBC 14.2* 15.9*  NEUTROABS 9.7*  --   HGB 5.5* 7.6*  HCT 15.6* 21.7*  MCV 86.7 87.9  PLT 443* 430*   Cardiac Enzymes:  Recent Labs Lab 05/23/13 0900  TROPONINI <0.30   BNP: BNP (last 3 results) No results found for this basename: PROBNP,  in the last 8760 hours CBG: No results found for this basename: GLUCAP,  in the last 168 hours   Signed:  Nariyah Osias L  [facility] 05/24/2013, 9:49 AM

## 2013-05-24 NOTE — Progress Notes (Signed)
[facility] Progress Note  Subjective:   Feels ok. Flank pain improving. No emergent complaints. Seen- NAD  Objective Filed Vitals:   05/23/13 1615 05/23/13 1715 05/23/13 2217 05/24/13 0457  BP: 124/74 140/80 144/90 127/75  Pulse: 83 89 65 70  Temp: 97.7 F (36.5 C) 98 F (36.7 C) 98.3 F (36.8 C) 98.5 F (36.9 C)  TempSrc: Oral Oral Oral Oral  Resp: 16 16 18 18   Height:   5\' 11"  (1.803 m)   Weight:   60.419 kg (133 lb 3.2 oz)   SpO2:   96% 95%   Physical Exam General: Alert, cooperative, NAD Heart: RRR, no murmur or rub Lungs: CTAB, no wheezes, rales or rhonchi Abdomen: Soft, NT, non-distended, normal BS Extremities: No LE edema Dialysis Access: LUA AVF + T/B  Dialysis Orders: Center: [facility] on TTS - request records EDW 138 lbs/ ~63kg HD Bath 2K/2.5Ca Time 4:00 Heparin 3700 u. Access LUA AVF  Hectorol 4 mcg IV/HD Aranesp 120 mcg weekly Venofer 0  Other: Leaving under EDW last few treatments   Assessment/Plan: 1. Severe Anemia - Per Admit. Hgb improving 7.6 > 5.5 s/p 1 unit PRBCs. He is on op Aranesp 120 mcg weekly, which we can continue here with next HD. 2. Sickle cell crisis - Mgmt per admit. 3. ESRD - TTS at [facility]. K+ 4.8 Got approx 45 min of treatment on Saturday. Can probably stay on regular schedule. Monitor labs. 4. Hypertension/volume - SBPs 120s-140s. No BP meds here. Was under op EDW by ~1kg on 8/23  Lungs are clear. No JVD or edema.  Next HD Monday or Tuesday depending on volume status and labs. Probably needs a lower EDW. Will be careful with SS pts to make sure to not get too volume depleted 5. Metabolic bone disease - Ca 9.5. Phos controlled on renvela 1 ac. Continue Sensipar. Add Vit D with next HD. Review op records  6. Nutrition - Alb 3.2, renal diet, multivitamin 7. Polysubstance use - Current marijuana use in addition to narcotic management of sickle cell pain   Scot Jun. [facility] Pager  [phone] 05/24/2013,8:13 AM  LOS: 1 day   Patient seen and examined, agree with above note with above modifications.  Pt in NAD today, hgb and pain better.  No indications for further HD, can probably wait til Tuesday for next treatment but will check chems in AM to make sure Annie Sable, MD 05/24/2013     Additional Objective Labs: Basic Metabolic Panel:  Recent Labs Lab 05/23/13 0900 05/23/13 1148 05/24/13 0430  NA 135  --  137  K 3.8  --  4.6  CL 101  --  102  CO2 23  --  28  GLUCOSE 97  --  95  BUN 31*  --  34*  CREATININE 5.13* 5.17* 6.02*  CALCIUM 9.1  --  9.5  PHOS  --  3.7  --    Liver Function Tests:  Recent Labs Lab 05/23/13 0900  AST 38*  ALT 10  ALKPHOS 68  BILITOT 4.1*  PROT 7.4  ALBUMIN 3.2*   No results found for this basename: LIPASE, AMYLASE,  in the last 168 hours CBC:  Recent Labs Lab 05/23/13 0900 05/24/13 0430  WBC 14.2* 15.9*  NEUTROABS 9.7*  --   HGB 5.5* 7.6*  HCT 15.6* 21.7*  MCV 86.7 87.9  PLT 443* 430*   Blood Culture    Component Value Date/Time   SDES URINE, CLEAN CATCH  04/14/2013 0909   SPECREQUEST ADDED [id number] 04/14/2013 0909   CULT NO GROWTH 04/14/2013 0909   REPTSTATUS 04/15/2013 FINAL 04/14/2013 0909    Cardiac Enzymes:  Recent Labs Lab 05/23/13 0900  TROPONINI <0.30   Studies/Results: Dg Chest 2 View  05/23/2013   *RADIOLOGY REPORT*  Clinical Data: Sickle cell crisis with pain.  CHEST - 2 VIEW  Comparison: 11/15/2012  Findings: The cardiac silhouette is mildly enlarged.  The mediastinum is normal in contour caliber.  There are no hilar masses.  The lungs are clear.  No pleural effusion or pneumothorax.  The bony thorax and surrounding soft tissues are unremarkable.  IMPRESSION: No acute cardiopulmonary disease.   Original Report Authenticated By: Amie Portland, M.D.   Medications:   . cinacalcet  30 mg Oral Q breakfast  . folic acid  1 mg Oral Daily  . heparin  5,000 Units Subcutaneous Q8H  .  senna-docusate  1 tablet Oral BID  . sevelamer carbonate  800 mg Oral TID WC  . sodium chloride  3 mL Intravenous Q12H

## 2013-07-10 ENCOUNTER — Emergency Department (HOSPITAL_COMMUNITY): Payer: Federal, State, Local not specified - PPO

## 2013-07-10 ENCOUNTER — Inpatient Hospital Stay (HOSPITAL_COMMUNITY)
Admission: EM | Admit: 2013-07-10 | Discharge: 2013-07-12 | DRG: 541 | Disposition: A | Discharge status: Home / Self Care | Payer: Federal, State, Local not specified - PPO | Attending: Internal Medicine | Admitting: Internal Medicine

## 2013-07-10 ENCOUNTER — Encounter (HOSPITAL_COMMUNITY): Payer: Self-pay | Admitting: Emergency Medicine

## 2013-07-10 DIAGNOSIS — N186 End stage renal disease: Secondary | ICD-10-CM

## 2013-07-10 DIAGNOSIS — Z8673 Personal history of transient ischemic attack (TIA), and cerebral infarction without residual deficits: Secondary | ICD-10-CM

## 2013-07-10 DIAGNOSIS — R7989 Other specified abnormal findings of blood chemistry: Secondary | ICD-10-CM

## 2013-07-10 DIAGNOSIS — M79672 Pain in left foot: Secondary | ICD-10-CM

## 2013-07-10 DIAGNOSIS — D649 Anemia, unspecified: Secondary | ICD-10-CM

## 2013-07-10 DIAGNOSIS — N390 Urinary tract infection, site not specified: Secondary | ICD-10-CM

## 2013-07-10 DIAGNOSIS — F1210 Cannabis abuse, uncomplicated: Secondary | ICD-10-CM | POA: Diagnosis present

## 2013-07-10 DIAGNOSIS — R0902 Hypoxemia: Secondary | ICD-10-CM

## 2013-07-10 DIAGNOSIS — F1290 Cannabis use, unspecified, uncomplicated: Secondary | ICD-10-CM | POA: Diagnosis present

## 2013-07-10 DIAGNOSIS — Z992 Dependence on renal dialysis: Secondary | ICD-10-CM

## 2013-07-10 DIAGNOSIS — E875 Hyperkalemia: Secondary | ICD-10-CM

## 2013-07-10 DIAGNOSIS — E213 Hyperparathyroidism, unspecified: Secondary | ICD-10-CM | POA: Diagnosis present

## 2013-07-10 DIAGNOSIS — D5700 Hb-SS disease with crisis, unspecified: Secondary | ICD-10-CM

## 2013-07-10 DIAGNOSIS — F17200 Nicotine dependence, unspecified, uncomplicated: Secondary | ICD-10-CM | POA: Diagnosis present

## 2013-07-10 DIAGNOSIS — D571 Sickle-cell disease without crisis: Secondary | ICD-10-CM

## 2013-07-10 DIAGNOSIS — J45909 Unspecified asthma, uncomplicated: Secondary | ICD-10-CM | POA: Diagnosis present

## 2013-07-10 DIAGNOSIS — R109 Unspecified abdominal pain: Secondary | ICD-10-CM

## 2013-07-10 DIAGNOSIS — M879 Osteonecrosis, unspecified: Secondary | ICD-10-CM

## 2013-07-10 DIAGNOSIS — I120 Hypertensive chronic kidney disease with stage 5 chronic kidney disease or end stage renal disease: Secondary | ICD-10-CM | POA: Diagnosis present

## 2013-07-10 DIAGNOSIS — J189 Pneumonia, unspecified organism: Principal | ICD-10-CM

## 2013-07-10 DIAGNOSIS — I509 Heart failure, unspecified: Secondary | ICD-10-CM | POA: Diagnosis present

## 2013-07-10 HISTORY — DX: Dependence on renal dialysis: Z99.2

## 2013-07-10 HISTORY — DX: Shortness of breath: R06.02

## 2013-07-10 HISTORY — DX: Pneumonia, unspecified organism: J18.9

## 2013-07-10 HISTORY — DX: End stage renal disease: N18.6

## 2013-07-10 LAB — CBC WITH DIFFERENTIAL/PLATELET
Basophils Absolute: 0 10*3/uL (ref 0.0–0.1)
Eosinophils Relative: 4 % (ref 0–5)
Lymphs Abs: 2.4 10*3/uL (ref 0.7–4.0)
MCH: 30.3 pg (ref 26.0–34.0)
MCV: 90.3 fL (ref 78.0–100.0)
Monocytes Absolute: 0.9 10*3/uL (ref 0.1–1.0)
Platelets: 248 10*3/uL (ref 150–400)
RBC: 1.65 MIL/uL — ABNORMAL LOW (ref 4.22–5.81)
RDW: 18.3 % — ABNORMAL HIGH (ref 11.5–15.5)

## 2013-07-10 LAB — BASIC METABOLIC PANEL
BUN: 28 mg/dL — ABNORMAL HIGH (ref 6–23)
CO2: 25 mEq/L (ref 19–32)
Calcium: 9.2 mg/dL (ref 8.4–10.5)
Chloride: 107 mEq/L (ref 96–112)
Creatinine, Ser: 5.27 mg/dL — ABNORMAL HIGH (ref 0.50–1.35)
Glucose, Bld: 98 mg/dL (ref 70–99)
Sodium: 141 mEq/L (ref 135–145)

## 2013-07-10 LAB — PREPARE RBC (CROSSMATCH)

## 2013-07-10 LAB — URINALYSIS, ROUTINE W REFLEX MICROSCOPIC
Bilirubin Urine: NEGATIVE
Ketones, ur: NEGATIVE mg/dL
Nitrite: NEGATIVE
pH: 8 (ref 5.0–8.0)

## 2013-07-10 LAB — RETICULOCYTES: Retic Count, Absolute: 52.8 10*3/uL (ref 19.0–186.0)

## 2013-07-10 LAB — URINE MICROSCOPIC-ADD ON

## 2013-07-10 MED ORDER — HYDROMORPHONE HCL PF 1 MG/ML IJ SOLN
2.0000 mg | Freq: Once | INTRAMUSCULAR | Status: AC
  Administered 2013-07-10: 2 mg via INTRAVENOUS
  Filled 2013-07-10: qty 2

## 2013-07-10 MED ORDER — ONDANSETRON HCL 4 MG/2ML IJ SOLN
4.0000 mg | Freq: Once | INTRAMUSCULAR | Status: AC
  Administered 2013-07-10: 4 mg via INTRAVENOUS
  Filled 2013-07-10: qty 2

## 2013-07-10 MED ORDER — HYDROMORPHONE HCL PF 1 MG/ML IJ SOLN
1.0000 mg | INTRAMUSCULAR | Status: DC | PRN
  Administered 2013-07-10 – 2013-07-12 (×11): 1 mg via INTRAVENOUS
  Filled 2013-07-10 (×10): qty 1

## 2013-07-10 MED ORDER — SODIUM CHLORIDE 0.9 % IV SOLN: INTRAVENOUS | Status: DC

## 2013-07-10 MED ORDER — HYDROMORPHONE HCL PF 1 MG/ML IJ SOLN
1.0000 mg | Freq: Once | INTRAMUSCULAR | Status: AC
  Administered 2013-07-10: 1 mg via INTRAVENOUS
  Filled 2013-07-10: qty 1

## 2013-07-10 MED ORDER — HEPARIN SODIUM (PORCINE) 5000 UNIT/ML IJ SOLN
5000.0000 [IU] | Freq: Three times a day (TID) | INTRAMUSCULAR | Status: DC
  Filled 2013-07-10 (×8): qty 1

## 2013-07-10 MED ORDER — DEXTROSE 5 % IV SOLN
2.0000 g | Freq: Once | INTRAVENOUS | Status: AC
  Administered 2013-07-10: 2 g via INTRAVENOUS

## 2013-07-10 MED ORDER — VANCOMYCIN HCL 10 G IV SOLR
1500.0000 mg | INTRAVENOUS | Status: AC
  Administered 2013-07-10: 1500 mg via INTRAVENOUS
  Filled 2013-07-10: qty 1500

## 2013-07-10 MED ORDER — HYDROMORPHONE HCL PF 1 MG/ML IJ SOLN: 1.0000 mg | INTRAMUSCULAR | Status: DC | PRN

## 2013-07-10 MED ORDER — SODIUM CHLORIDE 0.9 % IV SOLN
INTRAVENOUS | Status: DC
  Administered 2013-07-10: 18:00:00 via INTRAVENOUS

## 2013-07-10 MED ORDER — DEXTROSE 5 % IV SOLN
2.0000 g | INTRAVENOUS | Status: DC
  Administered 2013-07-11 – 2013-07-12 (×2): 2 g via INTRAVENOUS
  Filled 2013-07-10 (×2): qty 2

## 2013-07-10 MED ORDER — CEFEPIME HCL 1 G IJ SOLR
1.0000 g | Freq: Three times a day (TID) | INTRAMUSCULAR | Status: DC
  Filled 2013-07-10 (×3): qty 1

## 2013-07-10 MED ORDER — OXYCODONE-ACETAMINOPHEN 5-325 MG PO TABS
1.0000 | ORAL_TABLET | Freq: Four times a day (QID) | ORAL | Status: DC | PRN
  Administered 2013-07-10: 2 via ORAL
  Filled 2013-07-10: qty 2

## 2013-07-10 MED ORDER — SEVELAMER CARBONATE 800 MG PO TABS
800.0000 mg | ORAL_TABLET | Freq: Three times a day (TID) | ORAL | Status: DC
  Administered 2013-07-11 – 2013-07-12 (×4): 800 mg via ORAL
  Filled 2013-07-10 (×7): qty 1

## 2013-07-10 MED ORDER — ONDANSETRON HCL 4 MG/2ML IJ SOLN: 4.0000 mg | Freq: Three times a day (TID) | INTRAMUSCULAR | Status: DC | PRN

## 2013-07-10 MED ORDER — DIPHENHYDRAMINE HCL 25 MG PO CAPS
25.0000 mg | ORAL_CAPSULE | Freq: Four times a day (QID) | ORAL | Status: DC | PRN
  Administered 2013-07-10: 18:00:00 25 mg via ORAL
  Filled 2013-07-10: qty 1

## 2013-07-10 MED ORDER — DEXTROSE 5 % IV SOLN: 1.0000 g | Freq: Once | INTRAVENOUS | Status: DC

## 2013-07-10 MED ORDER — VANCOMYCIN HCL IN DEXTROSE 750-5 MG/150ML-% IV SOLN
750.0000 mg | INTRAVENOUS | Status: DC
  Administered 2013-07-11: 750 mg via INTRAVENOUS
  Filled 2013-07-10 (×2): qty 150

## 2013-07-10 MED ORDER — SODIUM CHLORIDE 0.9 % IV BOLUS (SEPSIS)
250.0000 mL | Freq: Once | INTRAVENOUS | Status: AC
  Administered 2013-07-10: 250 mL via INTRAVENOUS

## 2013-07-10 MED ORDER — DEXTROSE 5 % IV SOLN: 2.0000 g | INTRAVENOUS | Status: DC

## 2013-07-10 NOTE — Progress Notes (Signed)
MD Claiborne Billings notified that pt has had "back pain" and "fever" with past transfusions per blood bank. Pt had initially stated to RN he had blood in the past without issue. Upon asking again he stated that he had indeed had "back pain" and "fever". RN informed MD that Benadryl 25 mg PO had been given at 1746 and 2 percocet was given at 1756. Blood ok to transfuse per MD.

## 2013-07-10 NOTE — ED Notes (Signed)
Admitting MD at bedside.

## 2013-07-10 NOTE — ED Notes (Signed)
Pt reports he is having sickle cell pain since this am, reports pain in his abd, sides, and head. He is a HD pt. Last dialysis yesterday, states they type and crossed him yesterday at dialysis because his hgb was 5.5 and he may need to receive blood

## 2013-07-10 NOTE — Progress Notes (Signed)
Patient placed on telemetry per order, box #03

## 2013-07-10 NOTE — Progress Notes (Signed)
First unit of blood being transfused. Patient sleeping. No distress noted.

## 2013-07-10 NOTE — Progress Notes (Signed)
NURSING PROGRESS NOTE  Philip Rios [id number] Admission Data: 07/10/2013 6:14 PM Attending Provider: Vassie Loll, MD NWG:[id number],QMVHQ A, MD Code Status:full   Philip Rios is a 27 y.o. male patient admitted from ED:  -No acute distress noted.  -No complaints of shortness of breath.  -No complaints of chest pain.   Blood pressure 156/94, pulse 83, temperature 97.8 F (36.6 C), temperature source Oral, resp. rate 20, height 5\' 11"  (1.803 m), weight 60.056 kg (132 lb 6.4 oz), SpO2 93.00%.   IV Fluids:  IV in place, occlusive dsg intact without redness, IV cath forearm right, condition patent and no redness normal saline.   Allergies:  Codeine  Past Medical History:   has a past medical history of Sickle cell disease, type SS; Asthma; Renal disorder; Stroke (2000); Pica; Anemia; Hyperparathyroidism; Polysubstance abuse; CHF (congestive heart failure); ESRD on dialysis (03/07/2013); and Transfusion associated hemochromatosis.  Past Surgical History:   has past surgical history that includes Cholecystectomy; Renal biopsy; and AV fistula placement.  Social History:   reports that he has never smoked. He has never used smokeless tobacco. He reports that he uses illicit drugs (Marijuana) about 7 times per week. He reports that he does not drink alcohol.  Skin: dry intact, tattoos   Patient/Family orientated to room. Information packet given to patient/family. Admission inpatient armband information verified with patient/family to include name and date of birth and placed on patient arm. Side rails up x 2, fall assessment and education completed with patient/family. Patient/family able to verbalize understanding of risk associated with falls and verbalized understanding to call for assistance before getting out of bed. Call light within reach. Patient/family able to voice and demonstrate understanding of unit orientation instructions.    Will continue to evaluate and treat per MD  orders.

## 2013-07-10 NOTE — H&P (Signed)
[facility] History and Physical  Philip Rios QIO:[id number] DOB: July 24, 1986 DOA: 07/10/2013  Referring physician: Dr. Deretha Emory PCP: Dorrene German, MD   Chief Complaint: abd pain, low Hgb and dry cough  HPI: Philip Rios is a 27 y.o. male with PMH significant for sickle cell disease, ESRD, hyperparathyroidism and marijuana abuse; came to the hospital complaining of bilateral flank pain, lumbar pain and dysuria. Patient also endorses dry cough. Denies CP, SOB, fever chills, nausea, vomiting or any other complaints. In Ed was found with Hgb of 5; CXR demonstrating new infiltrates (concerns for PNA) and dirty UA. [facility] called to admit patient for further evaluation and treatment.  Review of Systems:  Negative except as mentioned on HPI.  Past Medical History  Diagnosis Date  . Sickle cell disease, type SS     frequent pain crises + hx of stroke  . Asthma   . Renal disorder     end stage renal failure  . Stroke 2000    no deficits  . Pica   . Anemia   . Hyperparathyroidism   . Polysubstance abuse   . CHF (congestive heart failure)   . ESRD on dialysis 03/07/2013    Patient has ESRD most likely from sickle cell disease.  He is dialyzed at [facility] in [place], [place]. His renal doctor's are from [facility].  He has a LUA AVF which was first used early June 2014 and has a tunneled HD cath which is supposed to be removed soon.  He started dialysis in July 2013. HD schedule is TTS, 4hrs, dry weight 136 lbs as of June 2014.    . Transfusion associated hemochromatosis    Past Surgical History  Procedure Laterality Date  . Cholecystectomy    . Renal biopsy    . Av fistula placement     Social History:  reports that he has never smoked. He has never used smokeless tobacco. He reports that he uses illicit drugs (Marijuana) about 7 times per week. He reports that he does not drink alcohol.   Allergies  Allergen Reactions  . Codeine Hives    Family History  Problem  Relation Age of Onset  . Diabetes Sister   . Other Mother   . Other Father     Prior to Admission medications   Medication Sig Start Date End Date Taking? Authorizing Provider  oxyCODONE-acetaminophen (PERCOCET/ROXICET) 5-325 MG per tablet Take 1-2 tablets by mouth every 6 (six) hours as needed for pain. 05/24/13  Yes Christiane Ha, MD  sevelamer carbonate (RENVELA) 800 MG tablet Take 800 mg by mouth 3 (three) times daily with meals.   Yes Historical Provider, MD   Physical Exam: Filed Vitals:   07/10/13 1500  BP: 129/78  Pulse: 90  Temp:   Resp: 17     General: afebrile, cooperative to examination; no CP; no SOB; complaining of intermittent dry cough  Eyes: PERRL, EOMI,no icterus  ENT: MMM, no erythema or exudates, no drainage out of ears or nostrils  Neck: no JVD, no thyromegaly  Cardiovascular: S1 and S2, no rubs or gallops  Respiratory: no crackles, good air movement, scattered rhonchi  Abdomen: suprapubic tenderness on palpation, no distension, positive BS  Skin: no rash or petechiae  Musculoskeletal: no edema, no erythema, no cyanosis; no joint swelling.  Psychiatric: no SI, no hallucinations  Neurologic: CN intact, no focal deficit, MS 5/5 bilaterally; AAOX3  Labs on Admission:  Basic Metabolic Panel:  Recent Labs Lab 07/10/13 1305  NA 141  K 4.2  CL 107  CO2 25  GLUCOSE 98  BUN 28*  CREATININE 5.27*  CALCIUM 9.2   CBC:  Recent Labs Lab 07/10/13 1305  WBC 11.4*  NEUTROABS 7.6  HGB 5.0*  HCT 14.9*  MCV 90.3  PLT 248    Radiological Exams on Admission: Dg Chest Port 1 View  07/10/2013   CLINICAL DATA:  Sickle cell pain crisis. Left-sided chest pain.  EXAM: PORTABLE CHEST - 1 VIEW  COMPARISON:  Multiple priors  FINDINGS: The cardiomediastinal silhouette is unchanged. No edema. Hazy opacity in the left mid lung. No pleural effusion or pneumothorax. No acute osseous abnormality.  IMPRESSION: Hazy opacity in the left mid lung. This may  represent developing pneumonia.   Electronically Signed   By: Jerene Dilling M.D.   On: 07/10/2013 13:02    Assessment/Plan 1-Sickle cell pain crisis: patient with mild exacerbation. Probably due to ongoing infection (HCAP/UTI). -he denies CP (making acute chest syndrome unlikely) -will provide PRN pain meds -will treat infection -Hgb is 5.0; so will transfuse 1 unit of PRBC -PRN antiemetics  2-HCAP: complaining of dry cough; CXR suggesting early PNA; patient on HD, so will treat as HCA. -will start on vanc and cefepime -will follow PNA protocol  3-Marijuana use: cessation counseling provided.  4-ESRD on dialysis:renal consulted and they will see patient in AM; to continue HD therapy T-T-S  5-UTI (lower urinary tract infection): still making urine. Patient with suprapubic discomfort and some burning sensation. Dirty UA. -started on vanc and cefepime (which would cover) -will follow cultures  DVT: heparin  Renal service consulted (Dr. Arlean Hopping).  Code Status: Full Family Communication: no family at bedside Disposition Plan: inpatient, LOS > 2 midnights, med-surg bed  Time spent: 50 minutes  Cleotilde Spadaccini [facility] Pager [phone]  If 7PM-7AM, please contact night-coverage [email or web address] Password [email or web address] 07/10/2013, 4:11 PM

## 2013-07-10 NOTE — ED Provider Notes (Signed)
CSN: [id number]     Arrival date & time 07/10/13  1049 History   First MD Initiated Contact with Patient 07/10/13 1107     Chief Complaint  Patient presents with  . Sickle Cell Pain Crisis   (Consider location/radiation/quality/duration/timing/severity/associated sxs/prior Treatment) Patient is a 27 y.o. male presenting with sickle cell pain. The history is provided by the patient.  Sickle Cell Pain Crisis Associated symptoms: headaches   Associated symptoms: no chest pain, no congestion, no cough, no fever, no nausea, no shortness of breath and no vomiting    patient with acute onset of bilateral flank pain consistent with his sickle cell pain. Also a little bit of left lumbar pain. Also a headache. Headache is not severe no bowel pain no nausea vomiting no fever no cough patient is a dialysis patient normally dialyzed Tuesday Thursdays and Saturdays was dialyzed yesterday noticed his hemoglobin was 5.5 felt that he was in a probably need a transfusion sent. Patient was not having sickle pain until today started 2 hours prior arrival and is in the bilateral flank area. Pain was 10 out of 10. Described as sharp. Not made better or worse by anything.  Past Medical History  Diagnosis Date  . Sickle cell disease, type SS     frequent pain crises + hx of stroke  . Asthma   . Renal disorder     end stage renal failure  . Stroke 2000    no deficits  . Pica   . Anemia   . Hyperparathyroidism   . Polysubstance abuse   . CHF (congestive heart failure)   . ESRD on dialysis 03/07/2013    Patient has ESRD most likely from sickle cell disease.  He is dialyzed at [facility] in [place], [place]. His renal doctor's are from [facility].  He has a LUA AVF which was first used early June 2014 and has a tunneled HD cath which is supposed to be removed soon.  He started dialysis in July 2013. HD schedule is TTS, 4hrs, dry weight 136 lbs as of June 2014.    . Transfusion associated hemochromatosis     Past Surgical History  Procedure Laterality Date  . Cholecystectomy    . Renal biopsy    . Av fistula placement     Family History  Problem Relation Age of Onset  . Diabetes Sister   . Other Mother   . Other Father    History  Substance Use Topics  . Smoking status: Never Smoker   . Smokeless tobacco: Never Used  . Alcohol Use: No    Review of Systems  Constitutional: Negative for fever.  HENT: Negative for congestion.   Eyes: Negative for redness.  Respiratory: Negative for cough and shortness of breath.   Cardiovascular: Negative for chest pain.  Gastrointestinal: Positive for abdominal pain. Negative for nausea and vomiting.  Genitourinary: Positive for flank pain.  Musculoskeletal: Positive for back pain.  Skin: Negative for rash.  Allergic/Immunologic: Positive for immunocompromised state.  Neurological: Positive for headaches.  Hematological: Does not bruise/bleed easily.  Psychiatric/Behavioral: Negative for confusion.    Allergies  Codeine  Home Medications   Current Outpatient Rx  Name  Route  Sig  Dispense  Refill  . oxyCODONE-acetaminophen (PERCOCET/ROXICET) 5-325 MG per tablet   Oral   Take 1-2 tablets by mouth every 6 (six) hours as needed for pain.   30 tablet   0   . sevelamer carbonate (RENVELA) 800 MG tablet   Oral  Take 800 mg by mouth 3 (three) times daily with meals.          BP 168/98  Pulse 81  Temp(Src) 98.5 F (36.9 C) (Oral)  Ht 5\' 11"  (1.803 m)  Wt 138 lb (62.596 kg)  BMI 19.26 kg/m2  SpO2 100% Physical Exam  Nursing note and vitals reviewed. Constitutional: He is oriented to person, place, and time. He appears well-developed and well-nourished. No distress.  HENT:  Head: Normocephalic and atraumatic.  Mouth/Throat: Oropharynx is clear and moist.  Eyes: Conjunctivae and EOM are normal. Pupils are equal, round, and reactive to light. Scleral icterus is present.  Neck: Normal range of motion. Neck supple.   Cardiovascular: Normal rate and regular rhythm.   No murmur heard. Pulmonary/Chest: Effort normal and breath sounds normal.  Abdominal: Soft. Bowel sounds are normal. There is no tenderness.  Musculoskeletal: Normal range of motion.  Left AV fistula good thrill good distal radial pulse.  Neurological: He is alert and oriented to person, place, and time. No cranial nerve deficit. He exhibits normal muscle tone. Coordination normal.  Skin: Skin is warm. No rash noted.    ED Course  Procedures (including critical care time) Labs Review Labs Reviewed  URINALYSIS, ROUTINE W REFLEX MICROSCOPIC - Abnormal; Notable for the following:    Hgb urine dipstick LARGE (*)    Protein, ur >300 (*)    Leukocytes, UA SMALL (*)    All other components within normal limits  BASIC METABOLIC PANEL - Abnormal; Notable for the following:    BUN 28 (*)    Creatinine, Ser 5.27 (*)    GFR calc non Af Amer 14 (*)    GFR calc Af Amer 16 (*)    All other components within normal limits  CBC WITH DIFFERENTIAL - Abnormal; Notable for the following:    RBC 1.65 (*)    Hemoglobin 5.0 (*)    HCT 14.9 (*)    RDW 18.3 (*)    All other components within normal limits  RETICULOCYTES - Abnormal; Notable for the following:    Retic Ct Pct 3.2 (*)    RBC. 1.65 (*)    All other components within normal limits  URINE MICROSCOPIC-ADD ON  CBC WITH DIFFERENTIAL  TYPE AND SCREEN   Results for orders placed during the hospital encounter of 07/10/13  URINALYSIS, ROUTINE W REFLEX MICROSCOPIC      Result Value Range   Color, Urine YELLOW  YELLOW   APPearance CLEAR  CLEAR   Specific Gravity, Urine 1.011  1.005 - 1.030   pH 8.0  5.0 - 8.0   Glucose, UA NEGATIVE  NEGATIVE mg/dL   Hgb urine dipstick LARGE (*) NEGATIVE   Bilirubin Urine NEGATIVE  NEGATIVE   Ketones, ur NEGATIVE  NEGATIVE mg/dL   Protein, ur >161 (*) NEGATIVE mg/dL   Urobilinogen, UA 1.0  0.0 - 1.0 mg/dL   Nitrite NEGATIVE  NEGATIVE   Leukocytes, UA  SMALL (*) NEGATIVE  URINE MICROSCOPIC-ADD ON      Result Value Range   Squamous Epithelial / LPF RARE  RARE   WBC, UA 21-50  <3 WBC/hpf   RBC / HPF 0-2  <3 RBC/hpf   Bacteria, UA RARE  RARE  BASIC METABOLIC PANEL      Result Value Range   Sodium 141  135 - 145 mEq/L   Potassium 4.2  3.5 - 5.1 mEq/L   Chloride 107  96 - 112 mEq/L   CO2 25  19 -  32 mEq/L   Glucose, Bld 98  70 - 99 mg/dL   BUN 28 (*) 6 - 23 mg/dL   Creatinine, Ser 9.81 (*) 0.50 - 1.35 mg/dL   Calcium 9.2  8.4 - 19.1 mg/dL   GFR calc non Af Amer 14 (*) >90 mL/min   GFR calc Af Amer 16 (*) >90 mL/min  CBC WITH DIFFERENTIAL      Result Value Range   WBC PENDING  4.0 - 10.5 K/uL   RBC 1.65 (*) 4.22 - 5.81 MIL/uL   Hemoglobin 5.0 (*) 13.0 - 17.0 g/dL   HCT 47.8 (*) 29.5 - 62.1 %   MCV 90.3  78.0 - 100.0 fL   MCH 30.3  26.0 - 34.0 pg   MCHC 33.6  30.0 - 36.0 g/dL   RDW 30.8 (*) 65.7 - 84.6 %   Platelets PENDING  150 - 400 K/uL   Neutrophils Relative % PENDING  43 - 77 %   Neutro Abs PENDING  1.7 - 7.7 K/uL   Band Neutrophils PENDING  0 - 10 %   Lymphocytes Relative PENDING  12 - 46 %   Lymphs Abs PENDING  0.7 - 4.0 K/uL   Monocytes Relative PENDING  3 - 12 %   Monocytes Absolute PENDING  0.1 - 1.0 K/uL   Eosinophils Relative PENDING  0 - 5 %   Eosinophils Absolute PENDING  0.0 - 0.7 K/uL   Basophils Relative PENDING  0 - 1 %   Basophils Absolute PENDING  0.0 - 0.1 K/uL   WBC Morphology PENDING     RBC Morphology PENDING     Smear Review PENDING     nRBC PENDING  0 /100 WBC   Metamyelocytes Relative PENDING     Myelocytes PENDING     Promyelocytes Absolute PENDING     Blasts PENDING    RETICULOCYTES      Result Value Range   Retic Ct Pct 3.2 (*) 0.4 - 3.1 %   RBC. 1.65 (*) 4.22 - 5.81 MIL/uL   Retic Count, Manual 52.8  19.0 - 186.0 K/uL    Imaging Review Dg Chest Port 1 View  07/10/2013   CLINICAL DATA:  Sickle cell pain crisis. Left-sided chest pain.  EXAM: PORTABLE CHEST - 1 VIEW  COMPARISON:   Multiple priors  FINDINGS: The cardiomediastinal silhouette is unchanged. No edema. Hazy opacity in the left mid lung. No pleural effusion or pneumothorax. No acute osseous abnormality.  IMPRESSION: Hazy opacity in the left mid lung. This may represent developing pneumonia.   Electronically Signed   By: Jerene Dilling M.D.   On: 07/10/2013 13:02    EKG Interpretation   None       MDM   1. Sickle cell crisis   2. UTI (lower urinary tract infection)   3. HCAP (healthcare-associated pneumonia)   4. Anemia    Patient with known history of sickle cell anemia frequently ends up with admissions when he presents. Patient is also renal dialysis patient normally dialyzed on Tuesday Thursdays and Saturdays was dialyzed yesterday he noticed that his hemoglobin was 5.5 and stated that he is probably having another blood transfusion soon. Today with the sickle crisis pain his hemoglobin dropped down to 5. Urinalysis also concerning for urinary tract infection chest x-ray also concerning for developing left sided pneumonia this would be a healthcare acquired pneumonia. Patient clinically without fever without any current symptoms of a pneumonia but due to this finding we'll treat with healthcare  acquired pneumonia antibiotics to be complete. Patient should not require admission to step down unit or ICU at this point in time. Therefore blood cultures not needed. Patient's pain improved here with 2 mg of hydromorphone down to a 3/10.    Shelda Jakes, MD 07/10/13 [phone]

## 2013-07-10 NOTE — Progress Notes (Signed)
ANTIBIOTIC CONSULT NOTE - INITIAL  Pharmacy Consult for vancomycin Indication: rule out pneumonia  Allergies  Allergen Reactions  . Codeine Hives    Patient Measurements: Height: 5\' 11"  (180.3 cm) Weight: 138 lb (62.596 kg) IBW/kg (Calculated) : 75.3 Adjusted Body Weight:   Vital Signs: Temp: 98.5 F (36.9 C) (10/10 1109) Temp src: Oral (10/10 1109) BP: 168/98 mmHg (10/10 1109) Pulse Rate: 81 (10/10 1109) Intake/Output from previous day:   Intake/Output from this shift:    Labs: No results found for this basename: WBC, HGB, PLT, LABCREA, CREATININE,  in the last 72 hours Estimated Creatinine Clearance: 16.6 ml/min (by C-G formula based on Cr of 6.02). No results found for this basename: VANCOTROUGH, VANCOPEAK, VANCORANDOM, GENTTROUGH, GENTPEAK, GENTRANDOM, TOBRATROUGH, TOBRAPEAK, TOBRARND, AMIKACINPEAK, AMIKACINTROU, AMIKACIN,  in the last 72 hours   Microbiology: No results found for this or any previous visit (from the past 720 hour(s)).  Medical History: Past Medical History  Diagnosis Date  . Sickle cell disease, type SS     frequent pain crises + hx of stroke  . Asthma   . Renal disorder     end stage renal failure  . Stroke 2000    no deficits  . Pica   . Anemia   . Hyperparathyroidism   . Polysubstance abuse   . CHF (congestive heart failure)   . ESRD on dialysis 03/07/2013    Patient has ESRD most likely from sickle cell disease.  He is dialyzed at [facility] in [place], [place]. His renal doctor's are from [facility].  He has a LUA AVF which was first used early June 2014 and has a tunneled HD cath which is supposed to be removed soon.  He started dialysis in July 2013. HD schedule is TTS, 4hrs, dry weight 136 lbs as of June 2014.    . Transfusion associated hemochromatosis     Medications:  Anti-infectives   Start     Dose/Rate Route Frequency Ordered Stop   07/11/13 1200  vancomycin (VANCOCIN) IVPB 750 mg/150 ml premix     750 mg 150 mL/hr over  60 Minutes Intravenous Every T-Th-Sa (Hemodialysis) 07/10/13 1334     07/10/13 1345  ceFEPIme (MAXIPIME) 2 g in dextrose 5 % 50 mL IVPB     2 g 100 mL/hr over 30 Minutes Intravenous  Once 07/10/13 1331     07/10/13 1345  vancomycin (VANCOCIN) 1,500 mg in sodium chloride 0.9 % 500 mL IVPB     1,500 mg 250 mL/hr over 120 Minutes Intravenous To Emergency Dept 07/10/13 1332 07/11/13 1345   07/10/13 1330  ceFEPIme (MAXIPIME) 1 g in dextrose 5 % 50 mL IVPB  Status:  Discontinued     1 g 100 mL/hr over 30 Minutes Intravenous  Once 07/10/13 1317 07/10/13 1331     Assessment: 27 yom presented to the ED with sickle cell pain. CXR positive for possible PNA. Pt is currently afebrile and baseline labs are pending. To start empiric vancomycin for possible pneumonia. EDP also ordered 1x dose of cefepime.   Goal of Therapy:  Pre-HD Vanc level 15-25  Plan:  1. Vancomycin 1500mg  IV x 1 then 750mg  IV post-HD TThSa 2. Change 1x cefepime order to cefepime 2gm IV x 1 3. F/u continuation of cefepime 4. F/u renal plans, C&S, clinical status and pre-HD vanc level when appropriate  Kanoelani Dobies, Drake Leach 07/10/2013,1:35 PM

## 2013-07-11 DIAGNOSIS — D571 Sickle-cell disease without crisis: Secondary | ICD-10-CM

## 2013-07-11 DIAGNOSIS — F1210 Cannabis abuse, uncomplicated: Secondary | ICD-10-CM

## 2013-07-11 LAB — CBC
Hemoglobin: 5.4 g/dL — CL (ref 13.0–17.0)
MCV: 90.2 fL (ref 78.0–100.0)
Platelets: 269 10*3/uL (ref 150–400)
RBC: 1.83 MIL/uL — ABNORMAL LOW (ref 4.22–5.81)
WBC: 16.4 10*3/uL — ABNORMAL HIGH (ref 4.0–10.5)

## 2013-07-11 LAB — BASIC METABOLIC PANEL
BUN: 30 mg/dL — ABNORMAL HIGH (ref 6–23)
CO2: 25 mEq/L (ref 19–32)
Calcium: 9.1 mg/dL (ref 8.4–10.5)
Potassium: 4.2 mEq/L (ref 3.5–5.1)
Sodium: 139 mEq/L (ref 135–145)

## 2013-07-11 LAB — HIV ANTIBODY (ROUTINE TESTING W REFLEX): HIV: NONREACTIVE

## 2013-07-11 LAB — HEMOGLOBIN AND HEMATOCRIT, BLOOD: Hemoglobin: 7.5 g/dL — ABNORMAL LOW (ref 13.0–17.0)

## 2013-07-11 LAB — HEPATITIS B SURFACE ANTIGEN: Hepatitis B Surface Ag: NEGATIVE

## 2013-07-11 LAB — PREPARE RBC (CROSSMATCH)

## 2013-07-11 MED ORDER — SODIUM CHLORIDE 0.9 % IV SOLN: 100.0000 mL | INTRAVENOUS | Status: DC | PRN

## 2013-07-11 MED ORDER — ALTEPLASE 2 MG IJ SOLR: 2.0000 mg | Freq: Once | INTRAMUSCULAR | Status: AC | PRN

## 2013-07-11 MED ORDER — ACETAMINOPHEN 325 MG PO TABS
650.0000 mg | ORAL_TABLET | Freq: Once | ORAL | Status: AC
  Administered 2013-07-11: 650 mg via ORAL

## 2013-07-11 MED ORDER — ACETAMINOPHEN 325 MG PO TABS
ORAL_TABLET | ORAL | Status: AC
  Filled 2013-07-11: qty 2

## 2013-07-11 MED ORDER — NEPRO/CARBSTEADY PO LIQD: 237.0000 mL | ORAL | Status: DC | PRN

## 2013-07-11 MED ORDER — LIDOCAINE-PRILOCAINE 2.5-2.5 % EX CREA: 1.0000 "application " | TOPICAL_CREAM | CUTANEOUS | Status: DC | PRN

## 2013-07-11 MED ORDER — HYDROMORPHONE HCL PF 1 MG/ML IJ SOLN
INTRAMUSCULAR | Status: AC
  Filled 2013-07-11: qty 1

## 2013-07-11 MED ORDER — PENTAFLUOROPROP-TETRAFLUOROETH EX AERO: 1.0000 "application " | INHALATION_SPRAY | CUTANEOUS | Status: DC | PRN

## 2013-07-11 MED ORDER — LIDOCAINE HCL (PF) 1 % IJ SOLN: 5.0000 mL | INTRAMUSCULAR | Status: DC | PRN

## 2013-07-11 MED ORDER — HEPARIN SODIUM (PORCINE) 1000 UNIT/ML DIALYSIS: 4200.0000 [IU] | Freq: Once | INTRAMUSCULAR | Status: DC

## 2013-07-11 MED ORDER — HEPARIN SODIUM (PORCINE) 1000 UNIT/ML DIALYSIS: 1000.0000 [IU] | INTRAMUSCULAR | Status: DC | PRN

## 2013-07-11 NOTE — Procedures (Signed)
I was present at this dialysis session. I have reviewed the session itself and made appropriate changes.   Vinson Moselle  MD Pager [phone]    Cell  [phone] 07/11/2013, 2:28 PM

## 2013-07-11 NOTE — Progress Notes (Signed)
Nutrition Brief Note  Patient identified on the Malnutrition Screening Tool (MST) Report for recent weight lost without trying and eating poorly because of a decreased appetite.  Per patient and readings below, patient's weight fluctuates.  Wt Readings from Last 15 Encounters:  07/10/13 132 lb 6.4 oz (60.056 kg)  05/23/13 133 lb 3.2 oz (60.419 kg)  05/10/13 138 lb (62.596 kg)  04/14/13 128 lb 15.5 oz (58.5 kg)  03/22/13 129 lb 3 oz (58.6 kg)  03/08/13 138 lb 14.2 oz (63 kg)  11/29/12 135 lb (61.236 kg)  07/24/12 127 lb (57.607 kg)  06/18/12 125 lb (56.7 kg)  03/15/12 147 lb 11.3 oz (67 kg)  01/04/12 135 lb (61.236 kg)  11/05/11 127 lb 9.6 oz (57.879 kg)  09/27/11 130 lb (58.968 kg)    Body mass index = 18.5 kg/m2. Patient meets criteria for Normal based on current BMI.   Current diet order is Regular; reports he's eating well. Labs and medications reviewed.   No nutrition interventions warranted at this time. If nutrition issues arise, please consult RD.   Maureen Chatters, RD, LDN Pager #: [phone] After-Hours Pager #: [phone]

## 2013-07-11 NOTE — Consult Note (Signed)
Renal Service Consult Note [facility]  Dimitry Holsworth 07/11/2013 Aryahna Spagna D Requesting Physician:  Dr Catha Gosselin  Reason for Consult:  ESRD pt with sickle crisis and anemia HPI: The patient is a 27 y.o. year-old with hx of sickle cell disease, HTN, ESRD and anemia admitted yesterday for sickle cell crisis. Pt was reporting bilat flank pain, lumbar pain and dysuria.  Hb was 5, CXR read as hazy L infiltrate but looks symmetrical.   ROS  no n/v/d  no abd pain  no cough or sob  no hemoptysis  no confusion  no jt pain or swelling  no skin rash  Past Medical History  Past Medical History  Diagnosis Date  . Sickle cell disease, type SS     frequent pain crises + hx of stroke  . Pica   . Anemia   . Hyperparathyroidism   . Polysubstance abuse   . CHF (congestive heart failure)   . Transfusion associated hemochromatosis   . Asthma     "grew out of it" (07/10/2013)  . Shortness of breath     "when I come in w/pain crisis" (07/10/2013)  . 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 Renal disorder     end stage renal failure  . ESRD (end stage renal disease) on dialysis 03/07/2013      most likely from sickle cell disease; dialyzed at [facility] in [facility], [place]. renal doctor's are from [facility].  He has a LUA AVF which was first used early June 2014; weight 136 lbs as of June 2014.  (07/10/2013)  . Pneumonia     "today makes the 3-4th time" (07/10/2013)   Past Surgical History  Past Surgical History  Procedure Laterality Date  . Renal biopsy  2013  . Av fistula placement Left 2014    "upper arm" (07/10/2013)  . Cholecystectomy  ?2008  . Insertion of dialysis catheter Right 03/2012    "chest" (07/10/2013)  . Removal of a dialysis catheter Right 11/2012    "chest" (07/10/2013)  . Wisdom tooth extraction  ~ 2007    "all 4" (07/10/2013)   Family History  Family History  Problem Relation Age of Onset  . Diabetes Sister   .  Other Mother   . Other Father    Social History  reports that he has been smoking Cigarettes.  He has a .6 pack-year smoking history. He has never used smokeless tobacco. He reports that he drinks about 4.8 ounces of alcohol per week. He reports that he uses illicit drugs (Marijuana) about 7 times per week. Allergies  Allergies  Allergen Reactions  . Codeine Hives   Home medications Prior to Admission medications   Medication Sig Start Date End Date Taking? Authorizing Provider  oxyCODONE-acetaminophen (PERCOCET/ROXICET) 5-325 MG per tablet Take 1-2 tablets by mouth every 6 (six) hours as needed for pain. 05/24/13  Yes Christiane Ha, MD  sevelamer carbonate (RENVELA) 800 MG tablet Take 800 mg by mouth 3 (three) times daily with meals.   Yes Historical Provider, MD   Liver Function Tests No results found for this basename: AST, ALT, ALKPHOS, BILITOT, PROT, ALBUMIN,  in the last 168 hours No results found for this basename: LIPASE, AMYLASE,  in the last 168 hours CBC  Recent Labs Lab 07/10/13 1305 07/11/13 0500  WBC 11.4* 16.4*  NEUTROABS 7.6  --   HGB 5.0* 5.4*  HCT 14.9* 16.5*  MCV 90.3 90.2  PLT 248  269   Basic Metabolic Panel  Recent Labs Lab 07/10/13 1305 07/11/13 0500  NA 141 139  K 4.2 4.2  CL 107 104  CO2 25 25  GLUCOSE 98 89  BUN 28* 30*  CREATININE 5.27* 5.92*  CALCIUM 9.2 9.1    Physical Exam:  Blood pressure 129/73, pulse 71, temperature 98.7 F (37.1 C), temperature source Oral, resp. rate 20, height 5\' 11"  (1.803 m), weight 60.056 kg (132 lb 6.4 oz), SpO2 96.00%. Gen: alert, no distress, calm Skin: no rash, cyanosis HEENT:  EOMI, sclera anicteric, throat clear Neck: no JVD, no LAN Chest: clear bilat, no rales or wheezing CV: regular, 2/6 SEM, no rub or gallop, pedal pulses intact Abdomen: soft, scaphoid, NT, ND, no ascites Ext: no LE or UE edema, no joint effusion, no gangrene/ulcers Neuro: alert, Ox3, nonfocal Access: LUA AVF  patent  Outpatient HD: ([facility] [phone]) 3hr   138lbs   2/2.0 Bath  Heparin 2200 bolus then 500/hr Hectorol 2ug tiw    Aranesp 120ug q thurs  Assessment: 1. Sickle cell painful crisis 2. 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 ESRD- cont TTS HD.  HD today 4. Metabolic bone disease- Ca wnl, cont vit D, binders 5. Hypertension/volume- at dry wt, keep even with HD today  Plan- HD today, transfuse prbc's  Vinson Moselle  MD Pager [phone]    Cell  [phone] 07/11/2013, 9:44 AM

## 2013-07-11 NOTE — Progress Notes (Signed)
[facility]                                                                                Patient Demographics  Philip Rios, is a 27 y.o. male, DOB - [birth year], ZOX:[id number]  Admit date - 07/10/2013   Admitting Physician No admitting provider for patient encounter.  Outpatient Primary MD for the patient is Dorrene German, MD  LOS - 1   Chief Complaint  Patient presents with  . Sickle Cell Pain Crisis        Assessment & Plan  Sickle cell pain crisis with Anemia -Currently he complains of abdominal pain today particularly flank pain this may be complicated by urinary tract infection. Awaiting urine cultures. -Currently denies chest pain. -Will continue when necessary pain medications as well as antiemetics. -Patient will likely receive another 2 units of packed red blood cells during dialysis today.  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 Hemoglobin improved to 5.4. -Dr. Arlean Hopping. Patient will receive 2 units of blood during dialysis today.  HCAP -Patient did complain of dry cough. Chest x-ray suggests a left mid lobe pneumonia. -Will continue antibiotic therapy of vancomycin and cefepime  Marijuana use: cessation counseling provided.   ESRD on dialysis: Nephrology consult if patient is a Tuesday Thursday Saturday dialysis patient.  UTI (lower urinary tract infection) -Pending urine cultures. Will continue vancomycin and cefepime.   Principal Problem:   Sickle cell pain crisis Active Problems:   Sickle cell disease   Marijuana use   ESRD on dialysis   HCAP (healthcare-associated pneumonia)   UTI (lower urinary tract infection)   Code Status: Full  Family Communication: None  Disposition Plan: Admitted.  May be discharged tomorrow 07/12/13.   Procedures hemodialysis  Consults  nephrology  DVT Prophylaxis   Heparin  Lab Results  Component Value Date   PLT 269  07/11/2013    Medications  Scheduled Meds: . ceFEPime (MAXIPIME) IV  2 g Intravenous Q T,Th,Sat-1800  . heparin  5,000 Units Subcutaneous Q8H  . sevelamer carbonate  800 mg Oral TID WC  . vancomycin  750 mg Intravenous Q T,Th,Sa-HD   Continuous Infusions:  PRN Meds:.diphenhydrAMINE, HYDROmorphone (DILAUDID) injection, oxyCODONE-acetaminophen  Antibiotics    Anti-infectives   Start     Dose/Rate Route Frequency Ordered Stop   07/11/13 1800  ceFEPIme (MAXIPIME) 2 g in dextrose 5 % 50 mL IVPB  Status:  Discontinued     2 g 100 mL/hr over 30 Minutes Intravenous Every T-Th-Sa (1800) 07/10/13 1640 07/10/13 1644   07/11/13 1800  ceFEPIme (MAXIPIME) 2 g in dextrose 5 % 50 mL IVPB     2 g 100 mL/hr over 30 Minutes Intravenous Every T-Th-Sa (1800) 07/10/13 1644 07/21/13 1759   07/11/13 1200  vancomycin (VANCOCIN) IVPB 750 mg/150 ml premix     750 mg 150 mL/hr over 60 Minutes Intravenous Every T-Th-Sa (Hemodialysis) 07/10/13 1334 07/21/13 1159   07/10/13 1645  ceFEPIme (MAXIPIME) 1 g in dextrose 5 % 50 mL IVPB  Status:  Discontinued     1 g 100 mL/hr over 30 Minutes Intravenous 3 times per day  07/10/13 1636 07/10/13 1640   07/10/13 1345  ceFEPIme (MAXIPIME) 2 g in dextrose 5 % 50 mL IVPB     2 g 100 mL/hr over 30 Minutes Intravenous  Once 07/10/13 1331 07/10/13 1510   07/10/13 1345  vancomycin (VANCOCIN) 1,500 mg in sodium chloride 0.9 % 500 mL IVPB     1,500 mg 250 mL/hr over 120 Minutes Intravenous To Emergency Dept 07/10/13 1332 07/10/13 1713   07/10/13 1330  ceFEPIme (MAXIPIME) 1 g in dextrose 5 % 50 mL IVPB  Status:  Discontinued     1 g 100 mL/hr over 30 Minutes Intravenous  Once 07/10/13 1317 07/10/13 1331       Time Spent in minutes   30 minutes   Philip Rios D.O. on 07/11/2013 at 11:10 AM  Between 7am to 7pm - Pager - [phone]  After 7pm go to [email or web address] - password [email or web address]  And look for the night coverage person covering for me after hours  [facility] Office  [phone]    Subjective:   Philip Rios seen and examined today.  Denies any current cough or shortness of breath.  Does continue to have some abdominal pain. Patient denies dizziness, chest pain, N/V/D/C, new weakness, numbess, tingling.    Objective:   Filed Vitals:   07/10/13 2027 07/10/13 2048 07/10/13 2245 07/11/13 0522  BP: 143/82 156/89 139/78 129/73  Pulse: 84 92 78 71  Temp: 98.5 F (36.9 C) 98.4 F (36.9 C) 98 F (36.7 C) 98.7 F (37.1 C)  TempSrc: Oral Oral Oral Oral  Resp: 18 18 18 20   Height:      Weight:      SpO2: 96% 95% 97% 96%    Wt Readings from Last 3 Encounters:  07/10/13 60.056 kg (132 lb 6.4 oz)  05/23/13 60.419 kg (133 lb 3.2 oz)  05/10/13 62.596 kg (138 lb)     Intake/Output Summary (Last 24 hours) at 07/11/13 1110 Last data filed at 07/11/13 0523  Gross per 24 hour  Intake    340 ml  Output      2 ml  Net    338 ml    Exam  General: Well developed, well nourished, NAD, appears stated age  HEENT: NCAT, PERRLA, EOMI, Anicteic Sclera, mucous membranes moist. No pharyngeal erythema or exudates  Neck: Supple, no JVD, no masses  Cardiovascular: S1 S2 auscultated, no rubs, murmurs or gallops.  Regular rate and rhythm.  Respiratory: Clear to auscultation bilaterally with equal chest rise  Abdomen: Soft, nondistended,  Tender to palpation in the suprapubic area as well as flank region, + bowel sounds  Extremities: warm dry without cyanosis clubbing or edema  Neuro: AAOx3, cranial nerves grossly intact. Strength 5/5 in patient's upper and lower extremities bilaterally  Skin: Without rashes exudates or nodules  Psych: Normal affect and demeanor with intact judgement and insight   Data Review   Micro Results No results found for this or any previous visit (from the past 240 hour(s)).  Radiology Reports Dg Chest Port 1 View  07/10/2013   CLINICAL DATA:  Sickle cell pain crisis. Left-sided chest pain.   EXAM: PORTABLE CHEST - 1 VIEW  COMPARISON:  Multiple priors  FINDINGS: The cardiomediastinal silhouette is unchanged. No edema. Hazy opacity in the left mid lung. No pleural effusion or pneumothorax. No acute osseous abnormality.  IMPRESSION: Hazy opacity in the left mid lung. This may represent developing pneumonia.   Electronically Signed   By: Jerene Dilling M.D.  On: 07/10/2013 13:02    CBC  Recent Labs Lab 07/10/13 1305 07/11/13 0500  WBC 11.4* 16.4*  HGB 5.0* 5.4*  HCT 14.9* 16.5*  PLT 248 269  MCV 90.3 90.2  MCH 30.3 29.5  MCHC 33.6 32.7  RDW 18.3* 18.2*  LYMPHSABS 2.4  --   MONOABS 0.9  --   EOSABS 0.5  --   BASOSABS 0.0  --     Chemistries   Recent Labs Lab 07/10/13 1305 07/11/13 0500  NA 141 139  K 4.2 4.2  CL 107 104  CO2 25 25  GLUCOSE 98 89  BUN 28* 30*  CREATININE 5.27* 5.92*  CALCIUM 9.2 9.1   ------------------------------------------------------------------------------------------------------------------ estimated creatinine clearance is 16.2 ml/min (by C-G formula based on Cr of 5.92). ------------------------------------------------------------------------------------------------------------------ No results found for this basename: HGBA1C,  in the last 72 hours ------------------------------------------------------------------------------------------------------------------ No results found for this basename: CHOL, HDL, LDLCALC, TRIG, CHOLHDL, LDLDIRECT,  in the last 72 hours ------------------------------------------------------------------------------------------------------------------ No results found for this basename: TSH, T4TOTAL, FREET3, T3FREE, THYROIDAB,  in the last 72 hours ------------------------------------------------------------------------------------------------------------------  Recent Labs  07/10/13 1305  RETICCTPCT 3.2*    Coagulation profile No results found for this basename: INR, PROTIME,  in the last 168  hours  No results found for this basename: DDIMER,  in the last 72 hours  Cardiac Enzymes No results found for this basename: CK, CKMB, TROPONINI, MYOGLOBIN,  in the last 168 hours ------------------------------------------------------------------------------------------------------------------ No components found with this basename: POCBNP,

## 2013-07-12 DIAGNOSIS — E875 Hyperkalemia: Secondary | ICD-10-CM

## 2013-07-12 LAB — TYPE AND SCREEN
ABO/RH(D): O NEG
Unit division: 0
Unit division: 0

## 2013-07-12 LAB — CBC
MCH: 30.5 pg (ref 26.0–34.0)
Platelets: 245 10*3/uL (ref 150–400)
RBC: 2.46 MIL/uL — ABNORMAL LOW (ref 4.22–5.81)
RDW: 17.8 % — ABNORMAL HIGH (ref 11.5–15.5)
WBC: 11 10*3/uL — ABNORMAL HIGH (ref 4.0–10.5)

## 2013-07-12 LAB — PHOSPHORUS: Phosphorus: 3.3 mg/dL (ref 2.3–4.6)

## 2013-07-12 MED ORDER — LEVOFLOXACIN 750 MG PO TABS
750.0000 mg | ORAL_TABLET | Freq: Once | ORAL | Status: AC
  Administered 2013-07-12: 750 mg via ORAL
  Filled 2013-07-12: qty 1

## 2013-07-12 MED ORDER — OXYCODONE-ACETAMINOPHEN 5-325 MG PO TABS: 1.0000 | ORAL_TABLET | Freq: Four times a day (QID) | ORAL | Status: DC | PRN

## 2013-07-12 MED ORDER — LEVOFLOXACIN 500 MG PO TABS: 500.0000 mg | ORAL_TABLET | ORAL | Status: DC

## 2013-07-12 MED ORDER — DARBEPOETIN ALFA-POLYSORBATE 150 MCG/0.3ML IJ SOLN: 120.0000 ug | INTRAMUSCULAR | Status: DC

## 2013-07-12 MED ORDER — DOXERCALCIFEROL 4 MCG/2ML IV SOLN: 2.0000 ug | INTRAVENOUS | Status: DC

## 2013-07-12 NOTE — Progress Notes (Signed)
ANTIBIOTIC CONSULT NOTE - FOLLOW UP  Pharmacy Consult for levaquin Indication: HCAP and UIT  Allergies  Allergen Reactions  . Codeine Hives    Patient Measurements: Height: 5\' 11"  (180.3 cm) Weight: 132 lb 4.4 oz (60 kg) IBW/kg (Calculated) : 75.3   Vital Signs: Temp: 98.4 F (36.9 C) (10/12 0541) Temp src: Oral (10/12 0541) BP: 159/80 mmHg (10/12 0541) Pulse Rate: 85 (10/12 0541) Intake/Output from previous day: 10/11 0701 - 10/12 0700 In: 700 [Blood:700] Out: 0  Intake/Output from this shift:    Labs:  Recent Labs  07/10/13 1305 07/11/13 0500 07/11/13 1828  WBC 11.4* 16.4*  --   HGB 5.0* 5.4* 7.5*  PLT 248 269  --   CREATININE 5.27* 5.92*  --    Estimated Creatinine Clearance: 16.2 ml/min (by C-G formula based on Cr of 5.92). No results found for this basename: VANCOTROUGH, VANCOPEAK, VANCORANDOM, GENTTROUGH, GENTPEAK, GENTRANDOM, TOBRATROUGH, TOBRAPEAK, TOBRARND, AMIKACINPEAK, AMIKACINTROU, AMIKACIN,  in the last 72 hours   Microbiology: No results found for this or any previous visit (from the past 720 hour(s)).  Anti-infectives   Start     Dose/Rate Route Frequency Ordered Stop   07/14/13 1100  levofloxacin (LEVAQUIN) tablet 500 mg     500 mg Oral Every 48 hours 07/12/13 0841     07/12/13 1100  levofloxacin (LEVAQUIN) tablet 750 mg     750 mg Oral  Once 07/12/13 0841     07/11/13 1800  ceFEPIme (MAXIPIME) 2 g in dextrose 5 % 50 mL IVPB  Status:  Discontinued     2 g 100 mL/hr over 30 Minutes Intravenous Every T-Th-Sa (1800) 07/10/13 1640 07/10/13 1644   07/11/13 1800  ceFEPIme (MAXIPIME) 2 g in dextrose 5 % 50 mL IVPB  Status:  Discontinued     2 g 100 mL/hr over 30 Minutes Intravenous Every T-Th-Sa (1800) 07/10/13 1644 07/12/13 0830   07/11/13 1200  vancomycin (VANCOCIN) IVPB 750 mg/150 ml premix  Status:  Discontinued     750 mg 150 mL/hr over 60 Minutes Intravenous Every T-Th-Sa (Hemodialysis) 07/10/13 1334 07/12/13 0841   07/10/13 1645  ceFEPIme  (MAXIPIME) 1 g in dextrose 5 % 50 mL IVPB  Status:  Discontinued     1 g 100 mL/hr over 30 Minutes Intravenous 3 times per day 07/10/13 1636 07/10/13 1640   07/10/13 1345  ceFEPIme (MAXIPIME) 2 g in dextrose 5 % 50 mL IVPB     2 g 100 mL/hr over 30 Minutes Intravenous  Once 07/10/13 1331 07/10/13 1510   07/10/13 1345  vancomycin (VANCOCIN) 1,500 mg in sodium chloride 0.9 % 500 mL IVPB     1,500 mg 250 mL/hr over 120 Minutes Intravenous To Emergency Dept 07/10/13 1332 07/10/13 1713   07/10/13 1330  ceFEPIme (MAXIPIME) 1 g in dextrose 5 % 50 mL IVPB  Status:  Discontinued     1 g 100 mL/hr over 30 Minutes Intravenous  Once 07/10/13 1317 07/10/13 1331      Assessment: 27 yo ESRD pt to transition from vancomycin/cefepime to levaquin for HCAP and UTI.   Wt 60 kg,  ESRD. No culture data.    Plan:  1. levaquin 750 mg po x 1 dose, the levaquin 500mg  po q48hrs Herby Abraham, Pharm.D. [phone] 07/12/2013 8:48 AM

## 2013-07-12 NOTE — Progress Notes (Signed)
Utilization Review Completed.Dorcas Carrow T10/08/2013

## 2013-07-12 NOTE — Progress Notes (Signed)
Subjective:  Feeling better, tolerated hd yesterday Objective Vital signs in last 24 hours: Filed Vitals:   07/11/13 1604 07/11/13 1655 07/11/13 2108 07/12/13 0541  BP: 160/93 152/90 163/91 159/80  Pulse: 77 79 77 85  Temp: 98.4 F (36.9 C) 98.9 F (37.2 C) 99.1 F (37.3 C) 98.4 F (36.9 C)  TempSrc: Oral Oral Oral Oral  Resp: 14 16 16 16   Height:      Weight: 60 kg (132 lb 4.4 oz)     SpO2: 100% 94% 98% 96%   Weight change: -2.696 kg (-5 lb 15.1 oz)  Intake/Output Summary (Last 24 hours) at 07/12/13 0902 Last data filed at 07/11/13 1604  Gross per 24 hour  Intake    700 ml  Output      0 ml  Net    700 ml   Labs: Basic Metabolic Panel:  Recent Labs Lab 07/10/13 1305 07/11/13 0500  NA 141 139  K 4.2 4.2  CL 107 104  CO2 25 25  GLUCOSE 98 89  BUN 28* 30*  CREATININE 5.27* 5.92*  CALCIUM 9.2 9.1   CBC:  Recent Labs Lab 07/10/13 1305 07/11/13 0500 07/11/13 1828  WBC 11.4* 16.4*  --   NEUTROABS 7.6  --   --   HGB 5.0* 5.4* 7.5*  HCT 14.9* 16.5* 21.8*  MCV 90.3 90.2  --   PLT 248 269  --     Medications:   . heparin  5,000 Units Subcutaneous Q8H  . levofloxacin  750 mg Oral Once   Followed by  . [START ON 07/14/2013] levofloxacin  500 mg Oral Q48H  . sevelamer carbonate  800 mg Oral TID WC    Physical Exam: General: Alert NAD . Appropriate Heart: RRR, no rub or murmur Lungs: CTA bilaterally  Abdomen: Bs pos, soft nontrender Extremities: Dialysis Access: No pedal edema, pos. Bruit  LUA AVF   Outpatient HD: ([facility] [phone])  3hr 138lbs 2/2.0 Bath Heparin 2200 bolus then 500/hr  Hectorol 2ug tiw Aranesp 120ug q thurs   Assessment/Rec:  1. Sickle cell painful crisis- pain controlled now 2. 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 ESRD- cont TTS HD. ([facility]) 4. Metabolic bone disease- Ca 9.1 On  vit D, binders AC 5. Hypertension/volume- at dry  wt, yesterday kept even with HD / sl below edw today ? Wt accuracy / no bp meds :"smoked Newports cigarettes Yesterday makes my bp rise " per pt.bp= 159/80  Lenny Pastel, PA-C [facility] Beeper [phone] 07/12/2013,9:02 AM  LOS: 2 days   Patient seen and examined.  Agree with assessment and plan as above.  Probable discharge today, stable from renal standpoint. Will resume usual HD schedule in [facility] after d/c. Vinson Moselle  MD Pager [phone]    Cell  [phone] 07/12/2013, 10:17 AM

## 2013-07-12 NOTE — Discharge Summary (Signed)
Physician Discharge Summary  Philip Rios WUJ:[id number] DOB: 26-May-1986 DOA: 07/10/2013  PCP: Dorrene German, MD  Admit date: 07/10/2013 Discharge date: 07/12/2013  Time spent: 35 minutes  Recommendations for Outpatient Follow-up:  Followup with primary care physician within one week of discharge. Continue medications as prescribed. Continue hemodialysis.  Discharge Diagnoses:  Principal Problem:   Sickle cell pain crisis Active Problems:   Sickle cell disease   Marijuana use   ESRD on dialysis   HCAP (healthcare-associated pneumonia)   UTI (lower urinary tract infection)   Discharge Condition: Stable  Diet recommendation: Heart healthy renal healthy  Filed Weights   07/10/13 1743 07/11/13 1205 07/11/13 1604  Weight: 60.056 kg (132 lb 6.4 oz) 59.9 kg (132 lb 0.9 oz) 60 kg (132 lb 4.4 oz)    History of present illness:  Philip Rios is a 27 y.o. male with PMH significant for sickle cell disease, ESRD, hyperparathyroidism and marijuana abuse; came to the hospital complaining of bilateral flank pain, lumbar pain and dysuria. Patient also endorses dry cough. Denies CP, SOB, fever chills, nausea, vomiting or any other complaints. In Ed was found with Hgb of 5; CXR demonstrating new infiltrates (concerns for PNA) and dirty UA. [facility] called to admit patient for further evaluation and treatment.  Hospital Course:  27 year old African male past medical history of sickle cell disease incisional disease requiring hemodialysis, hypothyroidism, marijuana abuse, presented to the hospital for dry cough and back pain, particularly on the left side.  Chest x-ray noted ectopy and no infiltrate with concerns of pneumonia in the left lobe.   His urinalysis was also found to be abnormal. Patient was admitted for HCAP well as UTI and placed on broad spectrum antibiotics.  Blood cultures and urine culture have not returned and should be followed up on. Patient also found to have a hemoglobin of  approximately 5 on admission. He received one unit of blood however his hemoglobin only increased to 5.4. He did dialyzed during dialysis received 2 units of blood. Currently his hemoglobin is 7.5. Patient does not have any active bleeding. It is currently hemodynamically stable. Patient was also found to be marijuana positive he was counseled on drug cessation. Patient denied any chest pain so he likely did not have full-blown sickle cell crisis however may have had some pain secondary to his age As well as UTI with anemia. Patient will be given Levaquin to continue for the next 10 days 1 pill every other day. His pain medication was also refilled her limited quantity.   Procedures:  hemodialysis   Consultations: Nephrology   Discharge Exam: Filed Vitals:   07/12/13 0541  BP: 159/80  Pulse: 85  Temp: 98.4 F (36.9 C)  Resp: 16    General: Well developed, well nourished, NAD, appears stated age  HEENT: NCAT, PERRLA, EOMI, Anicteic Sclera, mucous membranes moist. No pharyngeal erythema or exudates  Neck: Supple, no JVD, no masses  Cardiovascular: S1 S2 auscultated, no rubs, murmurs or gallops. Regular rate and rhythm.  Respiratory: Clear to auscultation bilaterally with equal chest rise  Abdomen: Soft, nondistended, Tender to palpation in the suprapubic area as well as flank region, + bowel sounds  Extremities: warm dry without cyanosis clubbing or edema, LUE AVFistula with good thrill Neuro: AAOx3, cranial nerves grossly intact. Strength 5/5 in patient's upper and lower extremities bilaterally  Skin: Without rashes exudates or nodules Psych: Normal affect and demeanor with intact judgement and insight   Discharge Instructions  Discharge Orders   Future Orders  Complete By Expires   Diet - low sodium heart healthy  As directed    Discharge instructions  As directed    Comments:     Medications as prescribed. Followup with primary care physician on a monitor discharge. Continue  dialysis   Increase activity slowly  As directed        Medication List         levofloxacin 500 MG tablet  Commonly known as:  LEVAQUIN  Take 1 tablet (500 mg total) by mouth every other day.  Start taking on:  07/14/2013     oxyCODONE-acetaminophen 5-325 MG per tablet  Commonly known as:  PERCOCET/ROXICET  Take 1-2 tablets by mouth every 6 (six) hours as needed for pain.     sevelamer carbonate 800 MG tablet  Commonly known as:  RENVELA  Take 800 mg by mouth 3 (three) times daily with meals.       Allergies  Allergen Reactions  . Codeine Hives       Follow-up Information   Follow up with AVBUERE,EDWIN A, MD. Schedule an appointment as soon as possible for a visit in 1 week.   Specialty:  Internal Medicine   Contact information:   [address] [place] [address] [phone]        The results of significant diagnostics from this hospitalization (including imaging, microbiology, ancillary and laboratory) are listed below for reference.    Significant Diagnostic Studies: Dg Chest Port 1 View  07/10/2013   CLINICAL DATA:  Sickle cell pain crisis. Left-sided chest pain.  EXAM: PORTABLE CHEST - 1 VIEW  COMPARISON:  Multiple priors  FINDINGS: The cardiomediastinal silhouette is unchanged. No edema. Hazy opacity in the left mid lung. No pleural effusion or pneumothorax. No acute osseous abnormality.  IMPRESSION: Hazy opacity in the left mid lung. This may represent developing pneumonia.   Electronically Signed   By: Jerene Dilling M.D.   On: 07/10/2013 13:02    Microbiology: No results found for this or any previous visit (from the past 240 hour(s)).   Labs: Basic Metabolic Panel:  Recent Labs Lab 07/10/13 1305 07/11/13 0500  NA 141 139  K 4.2 4.2  CL 107 104  CO2 25 25  GLUCOSE 98 89  BUN 28* 30*  CREATININE 5.27* 5.92*  CALCIUM 9.2 9.1   Liver Function Tests: No results found for this basename: AST, ALT, ALKPHOS, BILITOT, PROT, ALBUMIN,   in the last 168 hours No results found for this basename: LIPASE, AMYLASE,  in the last 168 hours No results found for this basename: AMMONIA,  in the last 168 hours CBC:  Recent Labs Lab 07/10/13 1305 07/11/13 0500 07/11/13 1828  WBC 11.4* 16.4*  --   NEUTROABS 7.6  --   --   HGB 5.0* 5.4* 7.5*  HCT 14.9* 16.5* 21.8*  MCV 90.3 90.2  --   PLT 248 269  --    Cardiac Enzymes: No results found for this basename: CKTOTAL, CKMB, CKMBINDEX, TROPONINI,  in the last 168 hours BNP: BNP (last 3 results) No results found for this basename: PROBNP,  in the last 8760 hours CBG: No results found for this basename: GLUCAP,  in the last 168 hours     Signed:  Edsel Petrin  [facility] 07/12/2013, 8:56 AM

## 2013-08-06 ENCOUNTER — Other Ambulatory Visit: Payer: Self-pay

## 2013-08-22 ENCOUNTER — Encounter (HOSPITAL_COMMUNITY): Payer: Self-pay | Admitting: Emergency Medicine

## 2013-08-22 ENCOUNTER — Emergency Department (HOSPITAL_COMMUNITY): Payer: Federal, State, Local not specified - PPO

## 2013-08-22 ENCOUNTER — Emergency Department (HOSPITAL_COMMUNITY)
Admission: EM | Admit: 2013-08-22 | Discharge: 2013-08-22 | Disposition: A | Discharge status: Home / Self Care | Payer: Federal, State, Local not specified - PPO | Attending: Emergency Medicine | Admitting: Emergency Medicine

## 2013-08-22 ENCOUNTER — Inpatient Hospital Stay (HOSPITAL_COMMUNITY)
Admission: EM | Admit: 2013-08-22 | Discharge: 2013-08-26 | DRG: 811 | Disposition: A | Discharge status: Home / Self Care | Payer: Federal, State, Local not specified - PPO | Attending: Internal Medicine | Admitting: Internal Medicine

## 2013-08-22 DIAGNOSIS — F17200 Nicotine dependence, unspecified, uncomplicated: Secondary | ICD-10-CM | POA: Insufficient documentation

## 2013-08-22 DIAGNOSIS — F1290 Cannabis use, unspecified, uncomplicated: Secondary | ICD-10-CM

## 2013-08-22 DIAGNOSIS — D5700 Hb-SS disease with crisis, unspecified: Principal | ICD-10-CM | POA: Diagnosis present

## 2013-08-22 DIAGNOSIS — E875 Hyperkalemia: Secondary | ICD-10-CM

## 2013-08-22 DIAGNOSIS — N390 Urinary tract infection, site not specified: Secondary | ICD-10-CM

## 2013-08-22 DIAGNOSIS — M879 Osteonecrosis, unspecified: Secondary | ICD-10-CM

## 2013-08-22 DIAGNOSIS — E213 Hyperparathyroidism, unspecified: Secondary | ICD-10-CM | POA: Insufficient documentation

## 2013-08-22 DIAGNOSIS — R7989 Other specified abnormal findings of blood chemistry: Secondary | ICD-10-CM

## 2013-08-22 DIAGNOSIS — D72829 Elevated white blood cell count, unspecified: Secondary | ICD-10-CM

## 2013-08-22 DIAGNOSIS — R109 Unspecified abdominal pain: Secondary | ICD-10-CM

## 2013-08-22 DIAGNOSIS — Z8701 Personal history of pneumonia (recurrent): Secondary | ICD-10-CM | POA: Insufficient documentation

## 2013-08-22 DIAGNOSIS — I509 Heart failure, unspecified: Secondary | ICD-10-CM | POA: Insufficient documentation

## 2013-08-22 DIAGNOSIS — J189 Pneumonia, unspecified organism: Secondary | ICD-10-CM

## 2013-08-22 DIAGNOSIS — Z8669 Personal history of other diseases of the nervous system and sense organs: Secondary | ICD-10-CM | POA: Insufficient documentation

## 2013-08-22 DIAGNOSIS — N186 End stage renal disease: Secondary | ICD-10-CM | POA: Insufficient documentation

## 2013-08-22 DIAGNOSIS — Z79899 Other long term (current) drug therapy: Secondary | ICD-10-CM | POA: Insufficient documentation

## 2013-08-22 DIAGNOSIS — Z992 Dependence on renal dialysis: Secondary | ICD-10-CM

## 2013-08-22 DIAGNOSIS — R0902 Hypoxemia: Secondary | ICD-10-CM

## 2013-08-22 DIAGNOSIS — Z8673 Personal history of transient ischemic attack (TIA), and cerebral infarction without residual deficits: Secondary | ICD-10-CM

## 2013-08-22 DIAGNOSIS — R5081 Fever presenting with conditions classified elsewhere: Secondary | ICD-10-CM | POA: Diagnosis present

## 2013-08-22 DIAGNOSIS — R509 Fever, unspecified: Secondary | ICD-10-CM

## 2013-08-22 DIAGNOSIS — J45901 Unspecified asthma with (acute) exacerbation: Secondary | ICD-10-CM | POA: Insufficient documentation

## 2013-08-22 DIAGNOSIS — M79672 Pain in left foot: Secondary | ICD-10-CM

## 2013-08-22 DIAGNOSIS — E871 Hypo-osmolality and hyponatremia: Secondary | ICD-10-CM | POA: Diagnosis present

## 2013-08-22 DIAGNOSIS — D571 Sickle-cell disease without crisis: Secondary | ICD-10-CM

## 2013-08-22 DIAGNOSIS — D638 Anemia in other chronic diseases classified elsewhere: Secondary | ICD-10-CM | POA: Diagnosis present

## 2013-08-22 DIAGNOSIS — R17 Unspecified jaundice: Secondary | ICD-10-CM | POA: Insufficient documentation

## 2013-08-22 DIAGNOSIS — N19 Unspecified kidney failure: Secondary | ICD-10-CM

## 2013-08-22 LAB — COMPREHENSIVE METABOLIC PANEL
ALT: 16 U/L (ref 0–53)
AST: 38 U/L — ABNORMAL HIGH (ref 0–37)
Albumin: 3.6 g/dL (ref 3.5–5.2)
Alkaline Phosphatase: 89 U/L (ref 39–117)
BUN: 41 mg/dL — ABNORMAL HIGH (ref 6–23)
Chloride: 101 mEq/L (ref 96–112)
Creatinine, Ser: 6.47 mg/dL — ABNORMAL HIGH (ref 0.50–1.35)
Potassium: 4.3 mEq/L (ref 3.5–5.1)
Sodium: 138 mEq/L (ref 135–145)
Total Bilirubin: 4 mg/dL — ABNORMAL HIGH (ref 0.3–1.2)
Total Protein: 7.6 g/dL (ref 6.0–8.3)

## 2013-08-22 LAB — CBC WITH DIFFERENTIAL/PLATELET
Basophils Absolute: 0 10*3/uL (ref 0.0–0.1)
Basophils Relative: 0 % (ref 0–1)
Hemoglobin: 7.2 g/dL — ABNORMAL LOW (ref 13.0–17.0)
MCHC: 33.6 g/dL (ref 30.0–36.0)
MCV: 90.7 fL (ref 78.0–100.0)
Monocytes Absolute: 1.6 10*3/uL — ABNORMAL HIGH (ref 0.1–1.0)
Monocytes Relative: 14 % — ABNORMAL HIGH (ref 3–12)
Neutro Abs: 8.3 10*3/uL — ABNORMAL HIGH (ref 1.7–7.7)
Neutrophils Relative %: 74 % (ref 43–77)
Platelets: 315 10*3/uL (ref 150–400)
RBC: 2.36 MIL/uL — ABNORMAL LOW (ref 4.22–5.81)
RDW: 17.5 % — ABNORMAL HIGH (ref 11.5–15.5)

## 2013-08-22 LAB — BASIC METABOLIC PANEL
BUN: 51 mg/dL — ABNORMAL HIGH (ref 6–23)
CO2: 21 mEq/L (ref 19–32)
Calcium: 8.6 mg/dL (ref 8.4–10.5)
Chloride: 99 mEq/L (ref 96–112)
Creatinine, Ser: 7.46 mg/dL — ABNORMAL HIGH (ref 0.50–1.35)
GFR calc Af Amer: 11 mL/min — ABNORMAL LOW (ref >90)
GFR calc non Af Amer: 9 mL/min — ABNORMAL LOW (ref >90)
Glucose, Bld: 84 mg/dL (ref 70–99)
Potassium: 5 mEq/L (ref 3.5–5.1)

## 2013-08-22 LAB — RETICULOCYTES: Retic Ct Pct: 5.1 % — ABNORMAL HIGH (ref 0.4–3.1)

## 2013-08-22 MED ORDER — SODIUM CHLORIDE 0.9 % IV BOLUS (SEPSIS)
1000.0000 mL | Freq: Once | INTRAVENOUS | Status: AC
  Administered 2013-08-22: 1000 mL via INTRAVENOUS

## 2013-08-22 MED ORDER — KETOROLAC TROMETHAMINE 30 MG/ML IJ SOLN
30.0000 mg | Freq: Once | INTRAMUSCULAR | Status: AC
  Administered 2013-08-22: 30 mg via INTRAVENOUS
  Filled 2013-08-22: qty 1

## 2013-08-22 MED ORDER — VANCOMYCIN HCL IN DEXTROSE 1-5 GM/200ML-% IV SOLN
1000.0000 mg | Freq: Once | INTRAVENOUS | Status: DC
  Filled 2013-08-22: qty 200

## 2013-08-22 MED ORDER — HYDROMORPHONE HCL PF 1 MG/ML IJ SOLN
1.0000 mg | Freq: Once | INTRAMUSCULAR | Status: AC
  Administered 2013-08-22: 1 mg via INTRAVENOUS
  Filled 2013-08-22: qty 1

## 2013-08-22 MED ORDER — CINACALCET HCL 30 MG PO TABS
30.0000 mg | ORAL_TABLET | Freq: Every day | ORAL | Status: DC
  Administered 2013-08-23 – 2013-08-25 (×3): 30 mg via ORAL
  Filled 2013-08-22 (×5): qty 1

## 2013-08-22 MED ORDER — PIPERACILLIN-TAZOBACTAM IN DEX 2-0.25 GM/50ML IV SOLN
2.2500 g | Freq: Three times a day (TID) | INTRAVENOUS | Status: DC
  Administered 2013-08-23 – 2013-08-25 (×7): 2.25 g via INTRAVENOUS
  Filled 2013-08-22 (×11): qty 50

## 2013-08-22 MED ORDER — VANCOMYCIN HCL 10 G IV SOLR
1500.0000 mg | Freq: Once | INTRAVENOUS | Status: AC
  Administered 2013-08-23: 1500 mg via INTRAVENOUS
  Filled 2013-08-22: qty 1500

## 2013-08-22 MED ORDER — VANCOMYCIN HCL IN DEXTROSE 750-5 MG/150ML-% IV SOLN
750.0000 mg | INTRAVENOUS | Status: DC
  Filled 2013-08-22: qty 150

## 2013-08-22 MED ORDER — DIPHENHYDRAMINE HCL 50 MG/ML IJ SOLN
25.0000 mg | Freq: Once | INTRAMUSCULAR | Status: AC
  Administered 2013-08-22: 25 mg via INTRAVENOUS
  Filled 2013-08-22: qty 1

## 2013-08-22 MED ORDER — ONDANSETRON HCL 4 MG/2ML IJ SOLN
4.0000 mg | Freq: Once | INTRAMUSCULAR | Status: AC
  Administered 2013-08-22: 4 mg via INTRAVENOUS
  Filled 2013-08-22: qty 2

## 2013-08-22 MED ORDER — ACETAMINOPHEN 325 MG PO TABS
650.0000 mg | ORAL_TABLET | Freq: Once | ORAL | Status: AC
  Administered 2013-08-22: 650 mg via ORAL
  Filled 2013-08-22: qty 2

## 2013-08-22 MED ORDER — OXYCODONE-ACETAMINOPHEN 5-325 MG PO TABS: 1.0000 | ORAL_TABLET | ORAL | Status: DC | PRN

## 2013-08-22 MED ORDER — SEVELAMER CARBONATE 800 MG PO TABS
800.0000 mg | ORAL_TABLET | Freq: Three times a day (TID) | ORAL | Status: DC
  Administered 2013-08-23 – 2013-08-25 (×5): 800 mg via ORAL
  Filled 2013-08-22 (×13): qty 1

## 2013-08-22 NOTE — ED Notes (Signed)
Pt. reports sickle cell pain crisis onset this evening with pain at both legs , lower abdomen , lower back and headache , respirations unlabored / no nausea or vomitting .

## 2013-08-22 NOTE — ED Notes (Signed)
Patient reminded to call friend or family for ride home. Appears to be awake and attempting to acquire ride.

## 2013-08-22 NOTE — ED Provider Notes (Signed)
CSN: [id number]     Arrival date & time 08/22/13  0023 History   First MD Initiated Contact with Patient 08/22/13 0046     Chief Complaint  Patient presents with  . Sickle Cell Pain Crisis   (Consider location/radiation/quality/duration/timing/severity/associated sxs/prior Treatment) HPI Patient presents with sickle cell crisis pain starting one hour prior to presentation. States the pain is very similar to previous sickle cell crises in the past. He's experiencing pain in his bilateral lateral abdomen lower back and lower extremities. Also complains of mild headache. He's had mild off and on chest pain though currently he has no chest pain. He has had a cough with mild sputum production. Denies any nausea vomiting or diarrhea.  Past Medical History  Diagnosis Date  . Sickle cell disease, type SS     frequent pain crises + hx of stroke  . Pica   . Anemia   . Hyperparathyroidism   . Polysubstance abuse   . CHF (congestive heart failure)   . Transfusion associated hemochromatosis   . Asthma     "grew out of it" (07/10/2013)  . Shortness of breath     "when I come in w/pain crisis" (07/10/2013)  . 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 Renal disorder     end stage renal failure  . ESRD (end stage renal disease) on dialysis 03/07/2013      most likely from sickle cell disease; dialyzed at [facility] in [facility], [place]. renal doctor's are from [facility].  He has a LUA AVF which was first used early June 2014; weight 136 lbs as of June 2014.  (07/10/2013)  . Pneumonia     "today makes the 3-4th time" (07/10/2013)   Past Surgical History  Procedure Laterality Date  . Renal biopsy  2013  . Av fistula placement Left 2014    "upper arm" (07/10/2013)  . Cholecystectomy  ?2008  . Insertion of dialysis catheter Right 03/2012    "chest" (07/10/2013)  . Removal of a dialysis catheter Right 11/2012    "chest" (07/10/2013)  . Wisdom tooth extraction  ~ 2007     "all 4" (07/10/2013)   Family History  Problem Relation Age of Onset  . Diabetes Sister   . Other Mother   . Other Father    History  Substance Use Topics  . Smoking status: Current Every Day Smoker -- 0.12 packs/day for 5 years    Types: Cigarettes  . Smokeless tobacco: Never Used  . Alcohol Use: 4.8 oz/week    8 Shots of liquor per week     Comment: 07/10/2013 "stopped alcohol ~ 1 month ago; maybe 3-5 shots, once or twice/wk"    Review of Systems  Constitutional: Negative for fever and chills.  Respiratory: Positive for cough. Negative for shortness of breath.   Cardiovascular: Positive for chest pain. Negative for palpitations and leg swelling.  Gastrointestinal: Positive for abdominal pain. Negative for nausea, vomiting, diarrhea and constipation.  Musculoskeletal: Positive for back pain and myalgias. Negative for neck pain and neck stiffness.  Skin: Negative for rash and wound.  Neurological: Positive for headaches. Negative for dizziness, syncope, weakness and numbness.  All other systems reviewed and are negative.    Allergies  Codeine  Home Medications   Current Outpatient Rx  Name  Route  Sig  Dispense  Refill  . cinacalcet (SENSIPAR) 30 MG tablet   Oral   Take 30 mg by mouth daily.         Marland Kitchen  oxyCODONE-acetaminophen (PERCOCET/ROXICET) 5-325 MG per tablet   Oral   Take 1-2 tablets by mouth every 6 (six) hours as needed for pain.   20 tablet   0   . sevelamer carbonate (RENVELA) 800 MG tablet   Oral   Take 800 mg by mouth 3 (three) times daily with meals.          BP 147/85  Pulse 84  Temp(Src) 100 F (37.8 C) (Oral)  Resp 12  SpO2 99% Physical Exam  Nursing note and vitals reviewed. Constitutional: He is oriented to person, place, and time. He appears well-developed and well-nourished. No distress.  HENT:  Head: Normocephalic and atraumatic.  Mouth/Throat: Oropharynx is clear and moist. No oropharyngeal exudate.  Eyes: EOM are normal. Pupils  are equal, round, and reactive to light. Scleral icterus is present.  Neck: Normal range of motion. Neck supple.  No meningismus  Cardiovascular: Normal rate and regular rhythm.   Pulmonary/Chest: Effort normal and breath sounds normal. No respiratory distress. He has no wheezes. He has no rales. He exhibits no tenderness.  Abdominal: Soft. Bowel sounds are normal. He exhibits no distension and no mass. There is tenderness (mild tenderness bilateral lower quadrants. Patient has no rebound or guarding.). There is no rebound and no guarding.  Musculoskeletal: Normal range of motion. He exhibits tenderness (diffuse lower sugary tenderness without focality. There is no joint swelling or warmth. Distal pulses intact.). He exhibits no edema.  Neurological: He is alert and oriented to person, place, and time.  Patient is alert and oriented x3 with clear, goal oriented speech. Patient has 5/5 motor in all extremities. Sensation is intact to light touch.   Skin: Skin is warm and dry. No rash noted. No erythema.  Psychiatric: He has a normal mood and affect. His behavior is normal.    ED Course  Procedures (including critical care time) Labs Review Labs Reviewed  CBC WITH DIFFERENTIAL - Abnormal; Notable for the following:    WBC 11.3 (*)    RBC 2.36 (*)    Hemoglobin 7.2 (*)    HCT 21.4 (*)    RDW 17.5 (*)    Neutro Abs 8.3 (*)    Lymphocytes Relative 11 (*)    Monocytes Relative 14 (*)    Monocytes Absolute 1.6 (*)    All other components within normal limits  COMPREHENSIVE METABOLIC PANEL - Abnormal; Notable for the following:    BUN 41 (*)    Creatinine, Ser 6.47 (*)    AST 38 (*)    Total Bilirubin 4.0 (*)    GFR calc non Af Amer 11 (*)    GFR calc Af Amer 13 (*)    All other components within normal limits  RETICULOCYTES - Abnormal; Notable for the following:    Retic Ct Pct 5.1 (*)    RBC. 2.36 (*)    All other components within normal limits   Imaging Review No results  found.  EKG Interpretation    Date/Time:  Saturday August 22 2013 01:19:30 EST Ventricular Rate:  81 PR Interval:  176 QRS Duration: 84 QT Interval:  384 QTC Calculation: 446 R Axis:   80 Text Interpretation:  Sinus rhythm LVH by voltage Nonspecific T abnormalities, lateral leads Confirmed by Ranae Palms  MD, Raidyn Breiner ([id number]) on 08/22/2013 2:47:19 AM            MDM   Patient states he is completely pain-free at this point. His vital signs remained stable. Chest x-ray without any acute  findings. Will discharge home with Percocet and advised patient to return immediately for worsening pain especially in his chest, shortness of breath, fever or any concerns.  Loren Racer, MD 08/22/13 [phone]

## 2013-08-22 NOTE — ED Provider Notes (Signed)
CSN: [id number]     Arrival date & time 08/22/13  1905 History   First MD Initiated Contact with Patient 08/22/13 2050     Chief Complaint  Patient presents with  . Sickle Cell Pain Crisis    Patient is a 27 y.o. male presenting with sickle cell pain. The history is provided by the patient.  Sickle Cell Pain Crisis Pain location: head, back , legs. Severity:  Severe Onset quality:  Gradual Duration:  1 day Timing:  Constant Progression:  Worsening Relieved by:  Nothing Worsened by:  Nothing tried Associated symptoms: chest pain and fever   Associated symptoms: no shortness of breath and no vomiting   pt presents for sickle cell pain crisis He reports pain in head, back, legs He reports mild chest pain but no SOB and no significant cough No vomiting/diarrhea No weakness He reports his pain is typical for a pain crisis  Past Medical History  Diagnosis Date  . Sickle cell disease, type SS     frequent pain crises + hx of stroke  . Pica   . Anemia   . Hyperparathyroidism   . Polysubstance abuse   . CHF (congestive heart failure)   . Transfusion associated hemochromatosis   . Asthma     "grew out of it" (07/10/2013)  . Shortness of breath     "when I come in w/pain crisis" (07/10/2013)  . 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 Renal disorder     end stage renal failure  . ESRD (end stage renal disease) on dialysis 03/07/2013      most likely from sickle cell disease; dialyzed at [facility] in [facility], [place]. renal doctor's are from [facility].  He has a LUA AVF which was first used early June 2014; weight 136 lbs as of June 2014.  (07/10/2013)  . Pneumonia     "today makes the 3-4th time" (07/10/2013)   Past Surgical History  Procedure Laterality Date  . Renal biopsy  2013  . Av fistula placement Left 2014    "upper arm" (07/10/2013)  . Cholecystectomy  ?2008  . Insertion of dialysis catheter Right 03/2012    "chest" (07/10/2013)  .  Removal of a dialysis catheter Right 11/2012    "chest" (07/10/2013)  . Wisdom tooth extraction  ~ 2007    "all 4" (07/10/2013)   Family History  Problem Relation Age of Onset  . Diabetes Sister   . Other Mother   . Other Father    History  Substance Use Topics  . Smoking status: Current Every Day Smoker -- 0.12 packs/day for 5 years    Types: Cigarettes  . Smokeless tobacco: Never Used  . Alcohol Use: 4.8 oz/week    8 Shots of liquor per week     Comment: 07/10/2013 "stopped alcohol ~ 1 month ago; maybe 3-5 shots, once or twice/wk"    Review of Systems  Constitutional: Positive for fever.  Respiratory: Negative for shortness of breath.   Cardiovascular: Positive for chest pain.  Gastrointestinal: Negative for vomiting and diarrhea.  Musculoskeletal: Negative for joint swelling.  All other systems reviewed and are negative.    Allergies  Codeine  Home Medications   Current Outpatient Rx  Name  Route  Sig  Dispense  Refill  . cinacalcet (SENSIPAR) 30 MG tablet   Oral   Take 30 mg by mouth daily.         Marland Kitchen oxyCODONE-acetaminophen (PERCOCET)  5-325 MG per tablet   Oral   Take 1 tablet by mouth every 4 (four) hours as needed.   10 tablet   0   . sevelamer carbonate (RENVELA) 800 MG tablet   Oral   Take 800 mg by mouth 3 (three) times daily with meals.          BP 162/92  Pulse 104  Temp(Src) 103 F (39.4 C) (Oral)  Resp 18  Wt 136 lb 8 oz (61.916 kg)  SpO2 98% Physical Exam CONSTITUTIONAL: Well developed/well nourished HEAD: Normocephalic/atraumatic EYES: EOMI/PERRL ENMT: Mucous membranes moist NECK: supple no meningeal signs SPINE:entire spine nontender CV: S1/S2 noted, no murmurs/rubs/gallops noted LUNGS: Lungs are clear to auscultation bilaterally, no apparent distress ABDOMEN: soft, nontender, no rebound or guarding GU:no cva tenderness NEURO: Pt is awake/alert, moves all extremitiesx4 EXTREMITIES: pulses normal, full ROM, no rash or wounds  noted to extremities.  No joint swelling noted SKIN: warm, color normal PSYCH: no abnormalities of mood noted  ED Course  Procedures  Labs Review Labs Reviewed  BASIC METABOLIC PANEL - Abnormal; Notable for the following:    Sodium 130 (*)    BUN 51 (*)    Creatinine, Ser 7.46 (*)    GFR calc non Af Amer 9 (*)    GFR calc Af Amer 11 (*)    All other components within normal limits  CULTURE, BLOOD (ROUTINE X 2)  CULTURE, BLOOD (ROUTINE X 2)  URINE CULTURE  URINALYSIS, ROUTINE W REFLEX MICROSCOPIC   Imaging Review Dg Chest 2 View  08/22/2013   CLINICAL DATA:  Sickle cell pain crisis  EXAM: CHEST  2 VIEW  COMPARISON:  Prior radiograph from 07/10/2013  FINDINGS: The transverse heart size is at the upper limits of normal, unchanged.  Lungs are well inflated. No focal infiltrate to suggest an acute infectious pneumonitis identified. No pulmonary edema or pleural effusion. No pneumothorax.  Osseous structures are unchanged.  IMPRESSION: No radiographic evidence of acute cardiopulmonary process.   Electronically Signed   By: Rise Mu M.D.   On: 08/22/2013 03:21    EKG Interpretation   None     10:39 PM Pt just seen in ED last night, felt improved but now has worsening pain and is febrile He is a dialysis patient (he missed today's session) Blood cultures sent as bactermia possible CBC was not re-ordered I won't re-image his chest as he just had normal CXR last night Will need admission Will start empiric antibiotics given h/o dialysis 11:23 PM D/w dr Lovell Sheehan, will admit and requests to add on zosyn as well as vancomycin for broad spectrum coverage.   BP 136/78  Pulse 88  Temp(Src) 98.8 F (37.1 C) (Oral)  Resp 18  Ht 5\' 11"  (1.803 m)  Wt 136 lb 8 oz (61.916 kg)  BMI 19.05 kg/m2  SpO2 95%  MDM  No diagnosis found. Nursing notes including past medical history and social history reviewed and considered in documentation Labs/vital reviewed and considered Previous  records reviewed and considered     Joya Gaskins, MD 08/22/13 2324

## 2013-08-22 NOTE — ED Notes (Signed)
Patient found asleep once again. Helped patient locate phone and patient is currently dialing looking for ride home.

## 2013-08-22 NOTE — ED Notes (Signed)
MD informed of patient's pain level and verbally ordered repeat doses of Dilaudid and Benadryl.

## 2013-08-22 NOTE — Progress Notes (Signed)
ANTIBIOTIC CONSULT NOTE - INITIAL  Pharmacy Consult for Vancocin and Zosyn Indication: fever of unknown origin  Allergies  Allergen Reactions  . Codeine Hives    Patient Measurements: Height: 5\' 11"  (180.3 cm) Weight: 136 lb 8 oz (61.916 kg) IBW/kg (Calculated) : 75.3  Vital Signs: Temp: 103 F (39.4 C) (11/22 1926) Temp src: Oral (11/22 1926) BP: 162/92 mmHg (11/22 1926) Pulse Rate: 104 (11/22 1926)  Labs:  Recent Labs  08/22/13 0051 08/22/13 2130  WBC 11.3*  --   HGB 7.2*  --   PLT 315  --   CREATININE 6.47* 7.46*   Estimated Creatinine Clearance: 13.3 ml/min (by C-G formula based on Cr of 7.46).   Microbiology: No results found for this or any previous visit (from the past 720 hour(s)).  Medical History: Past Medical History  Diagnosis Date  . Sickle cell disease, type SS     frequent pain crises + hx of stroke  . Pica   . Anemia   . Hyperparathyroidism   . Polysubstance abuse   . CHF (congestive heart failure)   . Transfusion associated hemochromatosis   . Asthma     "grew out of it" (07/10/2013)  . Shortness of breath     "when I come in w/pain crisis" (07/10/2013)  . 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 Renal disorder     end stage renal failure  . ESRD (end stage renal disease) on dialysis 03/07/2013      most likely from sickle cell disease; dialyzed at [facility] in [facility], [place]. renal doctor's are from [facility].  He has a LUA AVF which was first used early June 2014; weight 136 lbs as of June 2014.  (07/10/2013)  . Pneumonia     "today makes the 3-4th time" (07/10/2013)    Assessment: 27yo male was seen earlier today for sickle cell pain, now c/o pain in head and legs, has fever, to begin IV ABX empirically.  Goal of Therapy:  Pre-HD vanc level 15-25  Plan:  Will give vancomycin 1500mg  IV x1 now followed by 750mg  IV after each HD as well as Zosyn 2.25g IV Q8H and monitor CBC, Cx, levels  prn.  Vernard Gambles, PharmD, BCPS  08/22/2013,11:12 PM

## 2013-08-22 NOTE — ED Notes (Signed)
IV access attempted x2 without success. Additional RN asked to attempt access.

## 2013-08-22 NOTE — ED Notes (Signed)
Pt states sickle cell exacerbation was seen earlier for same but pain came back. Pain in head and legs. No neuro deficits noted.

## 2013-08-23 ENCOUNTER — Encounter (HOSPITAL_COMMUNITY): Payer: Self-pay | Admitting: *Deleted

## 2013-08-23 DIAGNOSIS — R509 Fever, unspecified: Secondary | ICD-10-CM | POA: Diagnosis present

## 2013-08-23 DIAGNOSIS — D72829 Elevated white blood cell count, unspecified: Secondary | ICD-10-CM | POA: Diagnosis present

## 2013-08-23 DIAGNOSIS — E871 Hypo-osmolality and hyponatremia: Secondary | ICD-10-CM | POA: Diagnosis present

## 2013-08-23 LAB — BASIC METABOLIC PANEL
BUN: 54 mg/dL — ABNORMAL HIGH (ref 6–23)
CO2: 22 mEq/L (ref 19–32)
Chloride: 103 mEq/L (ref 96–112)
Creatinine, Ser: 7.72 mg/dL — ABNORMAL HIGH (ref 0.50–1.35)
GFR calc Af Amer: 10 mL/min — ABNORMAL LOW (ref >90)

## 2013-08-23 LAB — URINE MICROSCOPIC-ADD ON

## 2013-08-23 LAB — URINALYSIS, ROUTINE W REFLEX MICROSCOPIC
Bilirubin Urine: NEGATIVE
Glucose, UA: NEGATIVE mg/dL
Ketones, ur: NEGATIVE mg/dL
Nitrite: NEGATIVE
Protein, ur: >300 mg/dL — AB
pH: 7.5 (ref 5.0–8.0)

## 2013-08-23 LAB — PREPARE RBC (CROSSMATCH)

## 2013-08-23 LAB — CBC
HCT: 20.2 % — ABNORMAL LOW (ref 39.0–52.0)
MCHC: 33.7 g/dL (ref 30.0–36.0)
MCV: 89.8 fL (ref 78.0–100.0)
Platelets: 293 10*3/uL (ref 150–400)
RDW: 18.7 % — ABNORMAL HIGH (ref 11.5–15.5)

## 2013-08-23 MED ORDER — ENOXAPARIN SODIUM 30 MG/0.3ML ~~LOC~~ SOLN
30.0000 mg | SUBCUTANEOUS | Status: DC
  Filled 2013-08-23 (×4): qty 0.3

## 2013-08-23 MED ORDER — HYDROMORPHONE HCL PF 1 MG/ML IJ SOLN
INTRAMUSCULAR | Status: AC
  Filled 2013-08-23: qty 2

## 2013-08-23 MED ORDER — DOXERCALCIFEROL 4 MCG/2ML IV SOLN
INTRAVENOUS | Status: AC
  Administered 2013-08-23: 2 ug via INTRAVENOUS
  Filled 2013-08-23: qty 2

## 2013-08-23 MED ORDER — VANCOMYCIN HCL IN DEXTROSE 750-5 MG/150ML-% IV SOLN
750.0000 mg | Freq: Once | INTRAVENOUS | Status: AC
Start: 2013-08-23 — End: 2013-08-23
  Administered 2013-08-23: 750 mg via INTRAVENOUS
  Filled 2013-08-23 (×3): qty 150

## 2013-08-23 MED ORDER — SODIUM CHLORIDE 0.9 % IV SOLN: 250.0000 mL | INTRAVENOUS | Status: DC | PRN

## 2013-08-23 MED ORDER — HYDROMORPHONE HCL PF 1 MG/ML IJ SOLN
2.0000 mg | INTRAMUSCULAR | Status: DC | PRN
  Administered 2013-08-23 – 2013-08-26 (×14): 2 mg via INTRAVENOUS
  Filled 2013-08-23 (×14): qty 2

## 2013-08-23 MED ORDER — DOXERCALCIFEROL 4 MCG/2ML IV SOLN
2.0000 ug | INTRAVENOUS | Status: AC | PRN
  Administered 2013-08-23: 2 ug via INTRAVENOUS
  Filled 2013-08-23: qty 2

## 2013-08-23 MED ORDER — RENA-VITE PO TABS
1.0000 | ORAL_TABLET | Freq: Every day | ORAL | Status: DC
  Administered 2013-08-23 – 2013-08-25 (×2): 1 via ORAL
  Filled 2013-08-23 (×4): qty 1

## 2013-08-23 MED ORDER — SODIUM CHLORIDE 0.9 % IJ SOLN: 3.0000 mL | INTRAMUSCULAR | Status: DC | PRN

## 2013-08-23 MED ORDER — HYDROMORPHONE HCL PF 1 MG/ML IJ SOLN
0.5000 mg | INTRAMUSCULAR | Status: DC | PRN
  Administered 2013-08-23 (×3): 1 mg via INTRAVENOUS
  Filled 2013-08-23 (×3): qty 1

## 2013-08-23 MED ORDER — DARBEPOETIN ALFA-POLYSORBATE 150 MCG/0.3ML IJ SOLN: 150.0000 ug | INTRAMUSCULAR | Status: DC

## 2013-08-23 MED ORDER — ACETAMINOPHEN 650 MG RE SUPP: 650.0000 mg | Freq: Four times a day (QID) | RECTAL | Status: DC | PRN

## 2013-08-23 MED ORDER — ONDANSETRON HCL 4 MG PO TABS
4.0000 mg | ORAL_TABLET | Freq: Four times a day (QID) | ORAL | Status: DC | PRN
  Administered 2013-08-24: 4 mg via ORAL

## 2013-08-23 MED ORDER — ACETAMINOPHEN 325 MG PO TABS
650.0000 mg | ORAL_TABLET | Freq: Four times a day (QID) | ORAL | Status: DC | PRN
  Administered 2013-08-24 (×2): 650 mg via ORAL
  Filled 2013-08-23 (×3): qty 2

## 2013-08-23 MED ORDER — ONDANSETRON HCL 4 MG/2ML IJ SOLN
4.0000 mg | Freq: Four times a day (QID) | INTRAMUSCULAR | Status: DC | PRN
  Administered 2013-08-23 – 2013-08-24 (×2): 4 mg via INTRAVENOUS
  Filled 2013-08-23 (×3): qty 2

## 2013-08-23 MED ORDER — FOLIC ACID 1 MG PO TABS
1.0000 mg | ORAL_TABLET | Freq: Every day | ORAL | Status: DC
  Administered 2013-08-24 – 2013-08-25 (×2): 1 mg via ORAL
  Filled 2013-08-23 (×4): qty 1

## 2013-08-23 MED ORDER — ZOLPIDEM TARTRATE 5 MG PO TABS: 5.0000 mg | ORAL_TABLET | Freq: Every evening | ORAL | Status: DC | PRN

## 2013-08-23 MED ORDER — SODIUM CHLORIDE 0.9 % IJ SOLN
3.0000 mL | Freq: Two times a day (BID) | INTRAMUSCULAR | Status: DC
  Administered 2013-08-24 – 2013-08-25 (×3): 3 mL via INTRAVENOUS

## 2013-08-23 NOTE — Progress Notes (Signed)
Pt admitted to the unit. Pt is alert and oriented. Pt oriented to room, staff, and call bell. Educated on fall safety plan. Bed in lowest position. Full assessment to Epic. Call bell with in reach. Educated to call for assists. Will continue to monitor. Hussain Maimone, RN 

## 2013-08-23 NOTE — H&P (Signed)
[facility] History and Physical  Ramirez Fullbright ZOX:[id number] DOB: 10/20/1985 DOA: 08/22/2013  Referring physician: EDP PCP: Dorrene German, MD  Specialists:   Chief Complaint: Fevers and increased Pain  HPI: Philip Rios is a 27 y.o. male with Sickle Cell Disease who was seen in the ED earlier in the AM 11/22 with complaints of increased pain in his head and back and legs.  He was evalauated and had relief of his pain and went home.  He returned in the Evening with return of his pain and complaints of fevers and chills.   His temperature was found to be 103 in the ED.   He reports having a cough, denies having any nausea or vomiting or diarrhea.  He reports the he has been around some sick friend who had cold symptoms.  In the ED Blood cultures were sent and he was placed on IV Vancomycin and Zosyn and referred for medical admission.    He has Dialysis on Tuesdays Thursdays and Saturdays and he missed his dialysis today because he was in the ED.   He was medicated for pain in the ED and his pain level is now a 2/10.      Review of Systems: The patient denies anorexia, weight loss, vision loss, diplopia, dizziness, decreased hearing, rhinitis, hoarseness, chest pain, syncope, dyspnea on exertion, peripheral edema, balance deficits, cough, hemoptysis, abdominal pain, nausea, vomiting, diarrhea, constipation, hematemesis, melena, hematochezia, severe indigestion/heartburn, dysuria, hematuria, incontinence, muscle weakness, suspicious skin lesions, transient blindness, difficulty walking, depression, unusual weight change, abnormal bleeding, enlarged lymph nodes, angioedema, and breast masses.    Past Medical History  Diagnosis Date  . Sickle cell disease, type SS     frequent pain crises + hx of stroke  . Pica   . Anemia   . Hyperparathyroidism   . Polysubstance abuse   . CHF (congestive heart failure)   . Transfusion associated hemochromatosis   . Asthma     "grew out of it"  (07/10/2013)  . Shortness of breath     "when I come in w/pain crisis" (07/10/2013)  . 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 Renal disorder     end stage renal failure  . ESRD (end stage renal disease) on dialysis 03/07/2013      most likely from sickle cell disease; dialyzed at [facility] in [facility], [place]. renal doctor's are from [facility].  He has a LUA AVF which was first used early June 2014; weight 136 lbs as of June 2014.  (07/10/2013)  . Pneumonia     "today makes the 3-4th time" (07/10/2013)    Past Surgical History  Procedure Laterality Date  . Renal biopsy  2013  . Av fistula placement Left 2014    "upper arm" (07/10/2013)  . Cholecystectomy  ?2008  . Insertion of dialysis catheter Right 03/2012    "chest" (07/10/2013)  . Removal of a dialysis catheter Right 11/2012    "chest" (07/10/2013)  . Wisdom tooth extraction  ~ 2007    "all 4" (07/10/2013)    Prior to Admission medications   Medication Sig Start Date End Date Taking? Authorizing Provider  cinacalcet (SENSIPAR) 30 MG tablet Take 30 mg by mouth daily. 08/20/13  Yes Historical Provider, MD  oxyCODONE-acetaminophen (PERCOCET) 5-325 MG per tablet Take 1 tablet by mouth every 4 (four) hours as needed. 08/22/13  Yes Loren Racer, MD  sevelamer carbonate (RENVELA) 800 MG tablet Take 800 mg by  mouth 3 (three) times daily with meals.   Yes Historical Provider, MD    Allergies  Allergen Reactions  . Codeine Hives    Social History:  reports that he has been smoking Cigarettes.  He has a .6 pack-year smoking history. He has never used smokeless tobacco. He reports that he drinks about 4.8 ounces of alcohol per week. He reports that he uses illicit drugs (Marijuana) about 7 times per week.     Family History  Problem Relation Age of Onset  . Diabetes Sister   . Other Mother   . Other Father     Physical Exam:  GEN:  Pleasant Thin well developed  27 y.o. African American male   examined  and in no acute distress; cooperative with exam Filed Vitals:   08/22/13 1926 08/22/13 2226 08/22/13 2314  BP: 162/92  136/78  Pulse: 104  88  Temp: 103 F (39.4 C)  98.8 F (37.1 C)  TempSrc: Oral  Oral  Resp: 18  18  Height:  5\' 11"  (1.803 m)   Weight: 61.916 kg (136 lb 8 oz)    SpO2: 98%  95%   Blood pressure 136/78, pulse 88, temperature 98.8 F (37.1 C), temperature source Oral, resp. rate 18, height 5\' 11"  (1.803 m), weight 61.916 kg (136 lb 8 oz), SpO2 95.00%. PSYCH: He is alert and oriented x4; does not appear anxious does not appear depressed; affect is normal HEENT: Normocephalic and Atraumatic, Mucous membranes pink; PERRLA; EOM intact; Fundi:  Benign;  No scleral icterus, Nares: Patent, Oropharynx: Clear,  Fair Dentition, Neck:  FROM, no cervical lymphadenopathy nor thyromegaly or carotid bruit; no JVD; Breasts:: Not examined CHEST WALL: No tenderness CHEST: Normal respiration, clear to auscultation bilaterally HEART: Regular rate and rhythm; no murmurs rubs or gallops BACK: No kyphosis or scoliosis; no CVA tenderness ABDOMEN: Positive Bowel Sounds, soft non-tender; no masses, no organomegaly.   Rectal Exam: Not done EXTREMITIES: No cyanosis, clubbing or edema; no ulcerations. Genitalia: not examined PULSES: 2+ and symmetric SKIN: Normal hydration no rash or ulceration CNS: Cranial nerves 2-12 grossly intact no focal neurologic deficit    Labs on Admission:  Basic Metabolic Panel:  Recent Labs Lab 08/22/13 0051 08/22/13 2130  NA 138 130*  K 4.3 5.0  CL 101 99  CO2 24 21  GLUCOSE 87 84  BUN 41* 51*  CREATININE 6.47* 7.46*  CALCIUM 8.8 8.6   Liver Function Tests:  Recent Labs Lab 08/22/13 0051  AST 38*  ALT 16  ALKPHOS 89  BILITOT 4.0*  PROT 7.6  ALBUMIN 3.6   No results found for this basename: LIPASE, AMYLASE,  in the last 168 hours No results found for this basename: AMMONIA,  in the last 168 hours CBC:  Recent Labs Lab  08/22/13 0051  WBC 11.3*  NEUTROABS 8.3*  HGB 7.2*  HCT 21.4*  MCV 90.7  PLT 315   Cardiac Enzymes: No results found for this basename: CKTOTAL, CKMB, CKMBINDEX, TROPONINI,  in the last 168 hours  BNP (last 3 results) No results found for this basename: PROBNP,  in the last 8760 hours CBG: No results found for this basename: GLUCAP,  in the last 168 hours  Radiological Exams on Admission: Dg Chest 2 View  08/22/2013   CLINICAL DATA:  Sickle cell pain crisis  EXAM: CHEST  2 VIEW  COMPARISON:  Prior radiograph from 07/10/2013  FINDINGS: The transverse heart size is at the upper limits of normal, unchanged.  Lungs are  well inflated. No focal infiltrate to suggest an acute infectious pneumonitis identified. No pulmonary edema or pleural effusion. No pneumothorax.  Osseous structures are unchanged.  IMPRESSION: No radiographic evidence of acute cardiopulmonary process.   Electronically Signed   By: Rise Mu M.D.   On: 08/22/2013 03:21      Assessment/Plan Principal Problem:   Fever Active Problems:   Sickle cell pain crisis   ESRD on dialysis   Hyponatremia   Leukocytosis   1.   Fever- Blood Cultures Sent, Placed on Empiric Broad Spectrum Antibiotics due to his multiple co-morbidities, IV Vanc and Zosyn.  Send Nasopharyngeal Flu Cx.    2.   Sickle Cell Pin Crisis- pain Control with IV Dilaudid PRN.    3.   ESRD on HD- on dilaysis T, Th, Sat.  Notify dialysis in AM sooner PRN if Signs of overload.   4.   Hyponatremia-  Correction with dialysis , monitor electrolytes.    5.    Leukocytosis-  Due to #1, versus Stress Rxn due to pain.    6.  DVT prophylaxis with Lovenox.       Code Status:    FULL CODE Family Communication:    No Family Present Disposition Plan:   Inpatient     Time spent:  83 Minutes  Ron Parker [facility] Pager [phone]  If 7PM-7AM, please contact night-coverage [email or web address] Password [email or web address] 08/23/2013, 12:12 AM

## 2013-08-23 NOTE — Progress Notes (Addendum)
[facility] PROGRESS NOTE  Philip Rios Rios ZOX:[id number] DOB: 1986/08/18 DOA: 08/22/2013 PCP: Dorrene German, MD  Assessment/Plan: 1. Fever -source unclear -on Empiric IV VAnc/Zosyn -Fu Blood CX, if negative x48hours, stop Abx and observe -could be related to sickle cell disease/mild crises too  2. ESRD -on HD, has AVF, missed HD yesterday -renal notified, needs HD today  3. ANemia of chronic disease -from ESRD and sickle cell dz -transfuse 1 unit with HD today  4. Sickle cell disease with mild crises -transfuse today, add folic acid -IV narcotics PRN  5. DVT proph: lovenox  Code Status: Full Family Communication: d/w pt and friend at bedside Disposition Plan: Home when improved   Consultants: Renal  Antibiotics: VAnc/Zosyn  HPI/Subjective: having pain in legs and arms  Objective: Filed Vitals:   08/23/13 0730  BP: 146/82  Pulse: 87  Temp: 98.5 F (36.9 C)  Resp: 20   No intake or output data in the 24 hours ending 08/23/13 1008 Filed Weights   08/22/13 1926 08/23/13 0032  Weight: 61.916 kg (136 lb 8 oz) 59.512 kg (131 lb 3.2 oz)    Exam:   General:  AAOx3, no distress  CVS: S1S2/RRR  Respiratory: diminished at bases  Abd: soft, NT, BS present Musculoskeletal: no edema c/c  Data Reviewed: Basic Metabolic Panel:  Recent Labs Lab 08/22/13 0051 08/22/13 2130 08/23/13 0655  NA 138 130* 135  K 4.3 5.0 5.3*  CL 101 99 103  CO2 24 21 22   GLUCOSE 87 84 84  BUN 41* 51* 54*  CREATININE 6.47* 7.46* 7.72*  CALCIUM 8.8 8.6 8.3*   Liver Function Tests:  Recent Labs Lab 08/22/13 0051  AST 38*  ALT 16  ALKPHOS 89  BILITOT 4.0*  PROT 7.6  ALBUMIN 3.6   No results found for this basename: LIPASE, AMYLASE,  in the last 168 hours No results found for this basename: AMMONIA,  in the last 168 hours CBC:  Recent Labs Lab 08/22/13 0051 08/23/13 0655  WBC 11.3* 14.5*  NEUTROABS 8.3*  --   HGB 7.2* 6.8*  HCT 21.4* 20.2*  MCV 90.7  89.8  PLT 315 293   Cardiac Enzymes: No results found for this basename: CKTOTAL, CKMB, CKMBINDEX, TROPONINI,  in the last 168 hours BNP (last 3 results) No results found for this basename: PROBNP,  in the last 8760 hours CBG: No results found for this basename: GLUCAP,  in the last 168 hours  Recent Results (from the past 240 hour(s))  MRSA PCR SCREENING     Status: None   Collection Time    08/23/13 12:40 AM      Result Value Range Status   MRSA by PCR NEGATIVE  NEGATIVE Final   Comment:            The GeneXpert MRSA Assay (FDA     approved for NASAL specimens     only), is one component of a     comprehensive MRSA colonization     surveillance program. It is not     intended to diagnose MRSA     infection nor to guide or     monitor treatment for     MRSA infections.     Studies: Dg Chest 2 View  08/22/2013   CLINICAL DATA:  Sickle cell pain crisis  EXAM: CHEST  2 VIEW  COMPARISON:  Prior radiograph from 07/10/2013  FINDINGS: The transverse heart size is at the upper limits of normal, unchanged.  Lungs are well  inflated. No focal infiltrate to suggest an acute infectious pneumonitis identified. No pulmonary edema or pleural effusion. No pneumothorax.  Osseous structures are unchanged.  IMPRESSION: No radiographic evidence of acute cardiopulmonary process.   Electronically Signed   By: Rise Mu M.D.   On: 08/22/2013 03:21    Scheduled Meds: . cinacalcet  30 mg Oral Q breakfast  . enoxaparin (LOVENOX) injection  30 mg Subcutaneous Q24H  . piperacillin-tazobactam (ZOSYN)  IV  2.25 g Intravenous Q8H  . sevelamer carbonate  800 mg Oral TID WC  . sodium chloride  3 mL Intravenous Q12H  . [START ON 08/25/2013] vancomycin  750 mg Intravenous Q T,Th,Sa-HD   Continuous Infusions:   Principal Problem:   Fever Active Problems:   ESRD on dialysis   Sickle cell pain crisis   Hyponatremia   Leukocytosis    Time spent:    [name]  [facility] Pager [phone] If 7PM-7AM, please contact night-coverage at [email or web address], password [email or web address] 08/23/2013, 10:08 AM  LOS: 1 day

## 2013-08-23 NOTE — Consult Note (Signed)
[facility] Renal Consultation Note    Indication for Consultation:  Management of ESRD/hemodialysis; anemia, hypertension/volume and secondary hyperparathyroidism  HPI: Philip Rios is a 27 y.o. male with ESRD (TTS HD [facility] [place]) and Sickle Cell SS who presented to the ED with complaints of abdominal, lower back and lower extremity pain similar to his previous pain crises. He was initially discharged home once pain resolved but returned several hours later with recurrence of his symptoms in addition to fever and chills.  He missed hemodialysis on Saturday secondary to pain and feeling poorly. He was subsequently admitted for work up of fever, hgb of 6.8 (below his outpatient baseline of low to mid 7's) and productive cough.  At the time of this encounter he is resting in bed and states his pain is resolving some. He reports that his dry weight is between 131-132lbs, significantly lower than last admission 10/10-10/12/14, which he attributes to starting a new, physically demanding job and not eating as much. He denies HA, dizziness, nausea, vomiting or diarrhea.  Past Medical History  Diagnosis Date  . Sickle cell disease, type SS     frequent pain crises + hx of stroke  . Pica   . Anemia   . Hyperparathyroidism   . Polysubstance abuse   . CHF (congestive heart failure)   . Transfusion associated hemochromatosis   . Asthma     "grew out of it" (07/10/2013)  . Shortness of breath     "when I come in w/pain crisis" (07/10/2013)  . 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 Renal disorder     end stage renal failure  . ESRD (end stage renal disease) on dialysis 03/07/2013      most likely from sickle cell disease; dialyzed at [facility] in [facility], [place]. renal doctor's are from [facility].  He has a LUA AVF which was first used early June 2014; weight 136 lbs as of June 2014.  (07/10/2013)  . Pneumonia     "today makes the 3-4th time"  (07/10/2013)   Past Surgical History  Procedure Laterality Date  . Renal biopsy  2013  . Av fistula placement Left 2014    "upper arm" (07/10/2013)  . Cholecystectomy  ?2008  . Insertion of dialysis catheter Right 03/2012    "chest" (07/10/2013)  . Removal of a dialysis catheter Right 11/2012    "chest" (07/10/2013)  . Wisdom tooth extraction  ~ 2007    "all 4" (07/10/2013)   Family History  Problem Relation Age of Onset  . Diabetes Sister   . Other Mother   . Other Father    Social History:  reports that he has been smoking Cigarettes.  He has a .6 pack-year smoking history. He has never used smokeless tobacco. He reports that he drinks about 4.8 ounces of alcohol per week. He reports that he uses illicit drugs (Marijuana) about 7 times per week. Allergies  Allergen Reactions  . Codeine Hives   Prior to Admission medications   Medication Sig Start Date End Date Taking? Authorizing Provider  cinacalcet (SENSIPAR) 30 MG tablet Take 30 mg by mouth daily. 08/20/13  Yes Historical Provider, MD  oxyCODONE-acetaminophen (PERCOCET) 5-325 MG per tablet Take 1 tablet by mouth every 4 (four) hours as needed. 08/22/13  Yes Loren Racer, MD  sevelamer carbonate (RENVELA) 800 MG tablet Take 800 mg by mouth 3 (three) times daily with meals.   Yes Historical Provider, MD  Current Facility-Administered Medications  Medication Dose Route Frequency Provider Last Rate Last Dose  . 0.9 %  sodium chloride infusion  250 mL Intravenous PRN Ron Parker, MD      . acetaminophen (TYLENOL) tablet 650 mg  650 mg Oral Q6H PRN Ron Parker, MD      . cinacalcet (SENSIPAR) tablet 30 mg  30 mg Oral Q breakfast Ron Parker, MD   30 mg at 08/23/13 0851  . enoxaparin (LOVENOX) injection 30 mg  30 mg Subcutaneous Q24H Harvette C Jenkins, MD      . HYDROmorphone (DILAUDID) injection 2 mg  2 mg Intravenous Q3H PRN Zannie Cove, MD      . ondansetron (ZOFRAN) tablet 4 mg  4 mg Oral Q6H PRN  Ron Parker, MD       Or  . ondansetron (ZOFRAN) injection 4 mg  4 mg Intravenous Q6H PRN Ron Parker, MD      . piperacillin-tazobactam (ZOSYN) IVPB 2.25 g  2.25 g Intravenous Q8H Ron Parker, MD 100 mL/hr at 08/23/13 0558 2.25 g at 08/23/13 0558  . sevelamer carbonate (RENVELA) tablet 800 mg  800 mg Oral TID WC Ron Parker, MD   800 mg at 08/23/13 0851  . sodium chloride 0.9 % injection 3 mL  3 mL Intravenous Q12H Harvette C Jenkins, MD      . sodium chloride 0.9 % injection 3 mL  3 mL Intravenous PRN Ron Parker, MD      . Melene Muller ON 08/25/2013] vancomycin (VANCOCIN) IVPB 750 mg/150 ml premix  750 mg Intravenous Q T,Th,Sa-HD Ron Parker, MD      . zolpidem (AMBIEN) tablet 5 mg  5 mg Oral QHS PRN Ron Parker, MD       Labs: Basic Metabolic Panel:  Recent Labs Lab 08/22/13 0051 08/22/13 2130 08/23/13 0655  NA 138 130* 135  K 4.3 5.0 5.3*  CL 101 99 103  CO2 24 21 22   GLUCOSE 87 84 84  BUN 41* 51* 54*  CREATININE 6.47* 7.46* 7.72*  CALCIUM 8.8 8.6 8.3*   Liver Function Tests:  Recent Labs Lab 08/22/13 0051  AST 38*  ALT 16  ALKPHOS 89  BILITOT 4.0*  PROT 7.6  ALBUMIN 3.6    CBC:  Recent Labs Lab 08/22/13 0051 08/23/13 0655  WBC 11.3* 14.5*  NEUTROABS 8.3*  --   HGB 7.2* 6.8*  HCT 21.4* 20.2*  MCV 90.7 89.8  PLT 315 293   Studies/Results: Dg Chest 2 View  08/22/2013   CLINICAL DATA:  Sickle cell pain crisis  EXAM: CHEST  2 VIEW  COMPARISON:  Prior radiograph from 07/10/2013  FINDINGS: The transverse heart size is at the upper limits of normal, unchanged.  Lungs are well inflated. No focal infiltrate to suggest an acute infectious pneumonitis identified. No pulmonary edema or pleural effusion. No pneumothorax.  Osseous structures are unchanged.  IMPRESSION: No radiographic evidence of acute cardiopulmonary process.   Electronically Signed   By: Rise Mu M.D.   On: 08/22/2013 03:21    ROS: Productive  cough, right flank pain, bilateral LE pain. Poor appetite. 10 pt ROS asked and answered. All other systems negative except as in the HPI above.    Physical Exam: Filed Vitals:   08/22/13 2314 08/23/13 0032 08/23/13 0510 08/23/13 0730  BP: 136/78 132/74 128/88 146/82  Pulse: 88 84 82 87  Temp: 98.8 F (37.1 C) 99.7 F (37.6 C) 99 F (  37.2 C) 98.5 F (36.9 C)  TempSrc: Oral Oral Oral Oral  Resp: 18 20 19 20   Height:  5\' 11"  (1.803 m)    Weight:  59.512 kg (131 lb 3.2 oz)    SpO2: 95% 91% 88% 94%     General: Thin, pleasant, cooperative, in no acute distress. Head: Normocephalic, atraumatic, sclera non-icteric, mucus membranes are moist Neck: Supple. JVD not elevated. Lungs: Clear bilaterally to auscultation without wheezes, rales, or rhonchi. Breathing is unlabored. Heart: RRR with S1 S2. No murmurs, rubs, or gallops appreciated. Abdomen: Soft, scaphoid, non-tender, normoactive bowel sounds. No rebound/guarding. No obvious abdominal masses. M-S:  Strength and tone appear normal for age. Lower extremities:without edema or ischemic changes, no open wounds  Neuro: Alert and oriented X 3. Moves all extremities spontaneously. Psych:  Responds to questions appropriately with a normal affect. Dialysis Access: L AVF + bruit  Outpatient HD:  [facility] [place] (From 07/10/13 admission, request records )  3hr 138lbs 2/2.0 Bath Heparin 2200 bolus then 500/hr  Hectorol 2ug tiw Aranesp 120ug q thurs  Assessment/Plan: 1. Fever - Mgmt per primary. On empiric abx. BCs pending. 2. Sickle Cell Crisis - Pain controlled. Mgmt per primary 3. ESRD -  TTS, Missed Sat. K+ 5.3. HD today 4. Hypertension/volume  - SBPs 140s. No meds. States his edw is ~131 lbs which is about what he is here. No overt vol excess. Request records. UF 1L as tolerated. 5. Anemia  - Hgb 6.8 < 7.2. Trends in 7's op due to his sickle cell. Plan 1 unit PRBCs with HD. Gets Aranesp q Thurs as op. No non-emergent HD here  this Thurs due to holiday schedule. Will order for Fri in case he is still admitted. Follow CBC 6. Metabolic bone disease -  Ca 8.3 (8.6 corrected) Continue Vit D, Renvela, and Sensipar. Request records. Renal panel a.m. 7. Nutrition - Alb 3.6. Renal diet. Add multivitamin  Claud Kelp, PA-C [facility] Pager [phone] 08/23/2013, 10:09 AM   I have seen and examined patient, discussed with PA and agree with assessment and plan as outlined above. Vinson Moselle MD pager [phone]    cell [phone] 08/23/2013, 5:59 PM

## 2013-08-24 DIAGNOSIS — R509 Fever, unspecified: Secondary | ICD-10-CM

## 2013-08-24 DIAGNOSIS — D5700 Hb-SS disease with crisis, unspecified: Principal | ICD-10-CM

## 2013-08-24 DIAGNOSIS — N186 End stage renal disease: Secondary | ICD-10-CM

## 2013-08-24 LAB — CBC
MCHC: 35.2 g/dL (ref 30.0–36.0)
MCV: 84.9 fL (ref 78.0–100.0)
Platelets: 244 10*3/uL (ref 150–400)
RBC: 2.51 MIL/uL — ABNORMAL LOW (ref 4.22–5.81)
RDW: 20.9 % — ABNORMAL HIGH (ref 11.5–15.5)
WBC: 10.7 10*3/uL — ABNORMAL HIGH (ref 4.0–10.5)

## 2013-08-24 LAB — RENAL FUNCTION PANEL
Albumin: 3.3 g/dL — ABNORMAL LOW (ref 3.5–5.2)
BUN: 27 mg/dL — ABNORMAL HIGH (ref 6–23)
Calcium: 8.5 mg/dL (ref 8.4–10.5)
GFR calc Af Amer: 16 mL/min — ABNORMAL LOW (ref >90)
GFR calc non Af Amer: 14 mL/min — ABNORMAL LOW (ref >90)
Phosphorus: 5.6 mg/dL — ABNORMAL HIGH (ref 2.3–4.6)
Potassium: 4.7 mEq/L (ref 3.5–5.1)
Sodium: 134 mEq/L — ABNORMAL LOW (ref 135–145)

## 2013-08-24 LAB — URINE CULTURE: Culture: NO GROWTH

## 2013-08-24 LAB — TYPE AND SCREEN
ABO/RH(D): O NEG
Antibody Screen: NEGATIVE

## 2013-08-24 MED ORDER — IBUPROFEN 800 MG PO TABS
800.0000 mg | ORAL_TABLET | Freq: Once | ORAL | Status: AC
  Administered 2013-08-24: 800 mg via ORAL
  Filled 2013-08-24: qty 1

## 2013-08-24 NOTE — Progress Notes (Signed)
[facility] Progress Note  Subjective:   Feeling better. No new issues.  Objective Filed Vitals:   08/24/13 0009 08/24/13 0125 08/24/13 0346 08/24/13 0506  BP: 142/87   134/87  Pulse: 63   87  Temp: 101.1 F (38.4 C) 101.2 F (38.4 C) 99.8 F (37.7 C) 98.9 F (37.2 C)  TempSrc: Oral Oral Oral Oral  Resp: 17   18  Height:      Weight:      SpO2: 98%   96%   Physical Exam General: NAD Heart: RRR Lungs: no wheezes or rales Abdomen: soft NT Extremities: no LE edema;  Dialysis Access: L AVF + bruit   Outpatient HD: [facility] [place] (From 07/10/13 admission, requesting records today )  3hr 138lbs (62.7)  2/2.0 Bath Heparin 2200 bolus then 500/hr  Hectorol 2ug tiw Aranesp 120ug q thurs  Assessment/Plan: 1. Fever - Tmax 101.2; BC pending CXR neg; empiric Vanc and Zosyn; ? Etiology; no wounds no new tatoos - doesn't usually get fevers with SS crises 2. ESRD - [facility]; HD Sunday off schedule due to missed HD on Saturday; evaluate tomorrow and see if ok to wait until Wed for HD and then this week do Sun - Wed and then Sat, back on schedule. 3. Anemia - Hgb 6.8 - transfused 1 unit) now 7.5 Aranesp 150 Q Fri ordered - first dose 11/28 (normally gets 120 q Thursday ) 4. Secondary hyperparathyroidism - sensipar 30 5. HTN/volume - UF 970cc yesterday with post weight 58.6 - below above EDW, but not current info and he doesn't know current edw - requesting records. 6. Nutrition - renal diet + multivit 7. SS crisis -per primary  Sheffield Slider, PA-C [facility] Beeper [phone] 08/24/2013,8:48 AM  LOS: 2 days    Additional Objective Labs: Basic Metabolic Panel:  Recent Labs Lab 08/22/13 2130 08/23/13 0655 08/24/13 0627  NA 130* 135 134*  K 5.0 5.3* 4.7  CL 99 103 96  CO2 21 22 26   GLUCOSE 84 84 97  BUN 51* 54* 27*  CREATININE 7.46* 7.72* 5.26*  CALCIUM 8.6 8.3* 8.5  PHOS  --   --  5.6*   Liver Function Tests:  Recent  Labs Lab 08/22/13 0051 08/24/13 0627  AST 38*  --   ALT 16  --   ALKPHOS 89  --   BILITOT 4.0*  --   PROT 7.6  --   ALBUMIN 3.6 3.3*   CBC:  Recent Labs Lab 08/22/13 0051 08/23/13 0655 08/24/13 0627  WBC 11.3* 14.5* 10.7*  NEUTROABS 8.3*  --   --   HGB 7.2* 6.8* 7.5*  HCT 21.4* 20.2* 21.3*  MCV 90.7 89.8 84.9  PLT 315 293 244   Blood Culture    Component Value Date/Time   SDES BLOOD LEFT HAND 08/22/2013 2130   SPECREQUEST BOTTLES DRAWN AEROBIC ONLY 10CC 08/22/2013 2130   CULT  Value:        BLOOD CULTURE RECEIVED NO GROWTH TO DATE CULTURE WILL BE HELD FOR 5 DAYS BEFORE ISSUING A FINAL NEGATIVE REPORT Performed at [facility] 08/22/2013 2130   REPTSTATUS PENDING 08/22/2013 2130  Medications:   . cinacalcet  30 mg Oral Q breakfast  . [START ON 08/28/2013] darbepoetin (ARANESP) injection - DIALYSIS  150 mcg Intravenous Q Fri-HD  . enoxaparin (LOVENOX) injection  30 mg Subcutaneous Q24H  . folic acid  1 mg Oral Daily  . multivitamin  1 tablet Oral QHS  .  piperacillin-tazobactam (ZOSYN)  IV  2.25 g Intravenous Q8H  . sevelamer carbonate  800 mg Oral TID WC  . sodium chloride  3 mL Intravenous Q12H  . [START ON 08/25/2013] vancomycin  750 mg Intravenous Q T,Th,Sa-HD

## 2013-08-24 NOTE — Progress Notes (Addendum)
[facility] PROGRESS NOTE  Philip Rios ZOX:[id number] DOB: 01-22-1986 DOA: 08/22/2013 PCP: Dorrene German, MD  Assessment/Plan: 1. Fever -source unclear -on Empiric IV VAnc/Zosyn Day2 -Fu Blood CX, if negative post 48hours will stop Abx and observe -could be related to sickle cell disease/mild crises too  2. ESRD -on HD, has AVF, missed HD saturday -renal notified, s/p HD yesterday  3. ANemia of chronic disease -from ESRD and sickle cell dz -transfused 1 unit 11/23  4. Sickle cell disease with mild crises -transfuses 1 unit PRBC, continue folic acid -IV narcotics PRN  5. DVT proph: lovenox  Code Status: Full Family Communication: d/w pt and friend at bedside Disposition Plan: Home when improved   Consultants: Renal  Antibiotics: VAnc/Zosyn  HPI/Subjective: Febrile overnight, but otherwise feels well  Objective: Filed Vitals:   08/24/13 0930  BP: 133/89  Pulse: 87  Temp: 98.6 F (37 C)  Resp: 18    Intake/Output Summary (Last 24 hours) at 08/24/13 1445 Last data filed at 08/24/13 0900  Gross per 24 hour  Intake    920 ml  Output    971 ml  Net    -51 ml   Filed Weights   08/23/13 1148 08/23/13 1539 08/23/13 2054  Weight: 60.4 kg (133 lb 2.5 oz) 58.6 kg (129 lb 3 oz) 59.2 kg (130 lb 8.2 oz)    Exam:   General:  AAOx3, no distress  CVS: S1S2/RRR  Respiratory: diminished at bases  Abd: soft, NT, BS present Musculoskeletal: no edema c/c  Data Reviewed: Basic Metabolic Panel:  Recent Labs Lab 08/22/13 0051 08/22/13 2130 08/23/13 0655 08/24/13 0627  NA 138 130* 135 134*  K 4.3 5.0 5.3* 4.7  CL 101 99 103 96  CO2 24 21 22 26   GLUCOSE 87 84 84 97  BUN 41* 51* 54* 27*  CREATININE 6.47* 7.46* 7.72* 5.26*  CALCIUM 8.8 8.6 8.3* 8.5  PHOS  --   --   --  5.6*   Liver Function Tests:  Recent Labs Lab 08/22/13 0051 08/24/13 0627  AST 38*  --   ALT 16  --   ALKPHOS 89  --   BILITOT 4.0*  --   PROT 7.6  --   ALBUMIN 3.6  3.3*   No results found for this basename: LIPASE, AMYLASE,  in the last 168 hours No results found for this basename: AMMONIA,  in the last 168 hours CBC:  Recent Labs Lab 08/22/13 0051 08/23/13 0655 08/24/13 0627  WBC 11.3* 14.5* 10.7*  NEUTROABS 8.3*  --   --   HGB 7.2* 6.8* 7.5*  HCT 21.4* 20.2* 21.3*  MCV 90.7 89.8 84.9  PLT 315 293 244   Cardiac Enzymes: No results found for this basename: CKTOTAL, CKMB, CKMBINDEX, TROPONINI,  in the last 168 hours BNP (last 3 results) No results found for this basename: PROBNP,  in the last 8760 hours CBG: No results found for this basename: GLUCAP,  in the last 168 hours  Recent Results (from the past 240 hour(s))  CULTURE, BLOOD (ROUTINE X 2)     Status: None   Collection Time    08/22/13  9:25 PM      Result Value Range Status   Specimen Description BLOOD LEFT ARM   Final   Special Requests BOTTLES DRAWN AEROBIC AND ANAEROBIC 10CC EA   Final   Culture  Setup Time     Final   Value: 08/23/2013 00:48     Performed at [facility]   Culture     Final   Value:        BLOOD CULTURE RECEIVED NO GROWTH TO DATE CULTURE WILL BE HELD FOR 5 DAYS BEFORE ISSUING A FINAL NEGATIVE REPORT     Performed at [facility]   Report Status PENDING   Incomplete  CULTURE, BLOOD (ROUTINE X 2)     Status: None   Collection Time    08/22/13  9:30 PM      Result Value Range Status   Specimen Description BLOOD LEFT HAND   Final   Special Requests BOTTLES DRAWN AEROBIC ONLY 10CC   Final   Culture  Setup Time     Final   Value: 08/23/2013 00:48     Performed at [facility]   Culture     Final   Value:        BLOOD CULTURE RECEIVED NO GROWTH TO DATE CULTURE WILL BE HELD FOR 5 DAYS BEFORE ISSUING A FINAL NEGATIVE REPORT     Performed at [facility]   Report Status PENDING   Incomplete  URINE CULTURE     Status: None   Collection Time    08/23/13 12:40 AM      Result Value Range Status   Specimen Description URINE,  CLEAN CATCH   Final   Special Requests NONE   Final   Culture  Setup Time     Final   Value: 08/23/2013 02:01     Performed at [facility] Count     Final   Value: NO GROWTH     Performed at [facility]   Culture     Final   Value: NO GROWTH     Performed at [facility]   Report Status 08/24/2013 FINAL   Final  MRSA PCR SCREENING     Status: None   Collection Time    08/23/13 12:40 AM      Result Value Range Status   MRSA by PCR NEGATIVE  NEGATIVE Final   Comment:            The GeneXpert MRSA Assay (FDA     approved for NASAL specimens     only), is one component of a     comprehensive MRSA colonization     surveillance program. It is not     intended to diagnose MRSA     infection nor to guide or     monitor treatment for     MRSA infections.     Studies: No results found.  Scheduled Meds: . cinacalcet  30 mg Oral Q breakfast  . [START ON 08/28/2013] darbepoetin (ARANESP) injection - DIALYSIS  150 mcg Intravenous Q Fri-HD  . enoxaparin (LOVENOX) injection  30 mg Subcutaneous Q24H  . folic acid  1 mg Oral Daily  . multivitamin  1 tablet Oral QHS  . piperacillin-tazobactam (ZOSYN)  IV  2.25 g Intravenous Q8H  . sevelamer carbonate  800 mg Oral TID WC  . sodium chloride  3 mL Intravenous Q12H  . [START ON 08/25/2013] vancomycin  750 mg Intravenous Q T,Th,Sa-HD   Continuous Infusions:   Principal Problem:   Fever Active Problems:   ESRD on dialysis   Sickle cell pain crisis   Hyponatremia   Leukocytosis    Time spent:    Philip Rios  [facility] Pager [phone] If 7PM-7AM, please contact night-coverage at [email or web address], password [facility] 08/24/2013, 2:45 PM  LOS:  2 days

## 2013-08-24 NOTE — Progress Notes (Signed)
Pt's had a tempt of 101.1, 650mg  tylenol given. Tempt equals 101.2 an hour after tylenol, MD notified, 800mg  of ibuprofen ordered. Tempt equals 99.8 an hour after ibuprofen, will continue to monitor.

## 2013-08-24 NOTE — Progress Notes (Signed)
I have personally seen and examined this patient and agree with the assessment/plan as outlined above by [facility] PA. Monchel Pollitt K.,MD 08/24/2013 10:43 AM

## 2013-08-25 LAB — BASIC METABOLIC PANEL
BUN: 43 mg/dL — ABNORMAL HIGH (ref 6–23)
CO2: 27 mEq/L (ref 19–32)
Calcium: 8.7 mg/dL (ref 8.4–10.5)
Chloride: 96 mEq/L (ref 96–112)
Creatinine, Ser: 6.91 mg/dL — ABNORMAL HIGH (ref 0.50–1.35)
GFR calc Af Amer: 12 mL/min — ABNORMAL LOW (ref >90)
GFR calc non Af Amer: 10 mL/min — ABNORMAL LOW (ref >90)
Glucose, Bld: 89 mg/dL (ref 70–99)
Potassium: 4.5 mEq/L (ref 3.5–5.1)
Sodium: 136 mEq/L (ref 135–145)

## 2013-08-25 LAB — CBC
HCT: 22 % — ABNORMAL LOW (ref 39.0–52.0)
Hemoglobin: 7.3 g/dL — ABNORMAL LOW (ref 13.0–17.0)
MCHC: 33.2 g/dL (ref 30.0–36.0)
Platelets: 275 10*3/uL (ref 150–400)
RBC: 2.56 MIL/uL — ABNORMAL LOW (ref 4.22–5.81)

## 2013-08-25 MED ORDER — HEPARIN SODIUM (PORCINE) 1000 UNIT/ML DIALYSIS
4000.0000 [IU] | INTRAMUSCULAR | Status: DC | PRN
  Administered 2013-08-26: 4000 [IU] via INTRAVENOUS_CENTRAL
  Filled 2013-08-25: qty 4

## 2013-08-25 NOTE — Progress Notes (Addendum)
Patient ID: Philip Rios, male   DOB: [birth year], 27 y.o.   MRN: [id number]   [facility] Progress Note   Assessment/ Plan:   1. Fever - afebrile for the past 24 hours, currently off of antibiotics. Plans noted per hospitalist to observe for another 24 hours and discharge given negative workup so far. No focal signs of infection/inflammation. 2. ESRD - last dialyzed on Sunday-request to dialyze tomorrow. No acute dialysis needs noted for today. May be able to discharge home after dialysis here at the hospital.  3. Anemia - Hgb 6.8 - transfused 1 unit) now 7.5 Aranesp 150 Q Fri ordered - first dose 11/28 (normally gets 120 q Thursday )  4. Secondary hyperparathyroidism - sensipar 30  5. HTN/volume - fair control blood pressures-no evidence of edema.  6. Nutrition - renal diet + multivit  7. SS crisis -per primary   Subjective:   Reports to be feeling fair-not very engaging in conversation    Objective:   BP 138/79  Pulse 80  Temp(Src) 98.2 F (36.8 C) (Oral)  Resp 18  Ht 5\' 11"  (1.803 m)  Wt 56.7 kg (125 lb)  BMI 17.44 kg/m2  SpO2 97%  Physical Exam: Gen: Comfortably sleeping in bed, easy to awaken CVS: Pulse regular in rate and rhythm, 2/6 ejection systolic murmur over apex Resp: Anteriorly clear to auscultation-no rales/rhonchi Abd: Soft, flat, nontender and bowel sounds are normal Ext: No lower extremity edema. Palpable thrill over right upper arm AV fistula  Labs: BMET  Recent Labs Lab 08/22/13 0051 08/22/13 2130 08/23/13 0655 08/24/13 0627 08/25/13 0633  NA 138 130* 135 134* 136  K 4.3 5.0 5.3* 4.7 4.5  CL 101 99 103 96 96  CO2 24 21 22 26 27   GLUCOSE 87 84 84 97 89  BUN 41* 51* 54* 27* 43*  CREATININE 6.47* 7.46* 7.72* 5.26* 6.91*  CALCIUM 8.8 8.6 8.3* 8.5 8.7  PHOS  --   --   --  5.6*  --    CBC  Recent Labs Lab 08/22/13 0051 08/23/13 0655 08/24/13 0627 08/25/13 0633  WBC 11.3* 14.5* 10.7* 9.6  NEUTROABS 8.3*  --   --   --   HGB  7.2* 6.8* 7.5* 7.3*  HCT 21.4* 20.2* 21.3* 22.0*  MCV 90.7 89.8 84.9 85.9  PLT 315 293 244 275   Medications:    . cinacalcet  30 mg Oral Q breakfast  . [START ON 08/28/2013] darbepoetin (ARANESP) injection - DIALYSIS  150 mcg Intravenous Q Fri-HD  . enoxaparin (LOVENOX) injection  30 mg Subcutaneous Q24H  . folic acid  1 mg Oral Daily  . multivitamin  1 tablet Oral QHS  . sevelamer carbonate  800 mg Oral TID WC  . sodium chloride  3 mL Intravenous Q12H   Zetta Bills, MD 08/25/2013, 11:08 AM

## 2013-08-25 NOTE — Progress Notes (Signed)
INITIAL NUTRITION ASSESSMENT  DOCUMENTATION CODES Per approved criteria  -Severe malnutrition in the context of acute illness or injury  Pt meets criteria for severe MALNUTRITION in the context of acute illness as evidenced by 8% wt loss in <1 month and meeting <50% of estimated needs for >5 days.  INTERVENTION: Resource Breeze po BID, each supplement provides 250 kcal and 9 grams of protein.  NUTRITION DIAGNOSIS: Inadequate oral intake related to sickle cell crisis pain as evidenced by reported 11 lb wt loss.   Goal: Pt to meet >/= 90% of their estimated nutrition needs   Monitor:  Wt, po intake, acceptance of supplements, I/O's  Reason for Assessment: Low BMI  27 y.o. male  Admitting Dx: Fever  ASSESSMENT: Pt with PMH including Sickle Cell Disease and ESRD on hemodialysis admitted for Sickle Cell Crisis. He reports that his usual body weight is 136 lbs and that he was eating very well before the pain started. His current body weight is 125 lbs which reflects an 8% loss in 1-2 weeks. He reports that he has been eating very little (<50%) for the past week. His appetite has been slowly improving. He said that he would try Resource Breeze to add calories and protein and to slow wt loss.   Na: WNL K: WNL Cr: 6.91 Phos: 5.6  Height: Ht Readings from Last 1 Encounters:  08/24/13 5\' 11"  (1.803 m)    Weight: Wt Readings from Last 1 Encounters:  08/24/13 125 lb (56.7 kg)    Ideal Body Weight: 75.3 kg  % Ideal Body Weight: 75%  Wt Readings from Last 10 Encounters:  08/24/13 125 lb (56.7 kg)  07/11/13 132 lb 4.4 oz (60 kg)  05/23/13 133 lb 3.2 oz (60.419 kg)  05/10/13 138 lb (62.596 kg)  04/14/13 128 lb 15.5 oz (58.5 kg)  03/22/13 129 lb 3 oz (58.6 kg)  03/08/13 138 lb 14.2 oz (63 kg)  11/29/12 135 lb (61.236 kg)  07/24/12 127 lb (57.607 kg)  06/18/12 125 lb (56.7 kg)    Usual Body Weight: 136 lbs  % Usual Body Weight: 92%  BMI:  Body mass index is 17.44  kg/(m^2).  Estimated Nutritional Needs: Kcal: 1700-1950 Protein: 70-80 g Fluid: 1200 mL fluid restriction  Skin: WNL  Diet Order: Renal  EDUCATION NEEDS: -No education needs identified at this time   Intake/Output Summary (Last 24 hours) at 08/25/13 1508 Last data filed at 08/25/13 1300  Gross per 24 hour  Intake    460 ml  Output      0 ml  Net    460 ml    Last BM: none recorded   Labs:   Recent Labs Lab 08/23/13 0655 08/24/13 0627 08/25/13 0633  NA 135 134* 136  K 5.3* 4.7 4.5  CL 103 96 96  CO2 22 26 27   BUN 54* 27* 43*  CREATININE 7.72* 5.26* 6.91*  CALCIUM 8.3* 8.5 8.7  PHOS  --  5.6*  --   GLUCOSE 84 97 89    CBG (last 3)  No results found for this basename: GLUCAP,  in the last 72 hours  Scheduled Meds: . cinacalcet  30 mg Oral Q breakfast  . [START ON 08/28/2013] darbepoetin (ARANESP) injection - DIALYSIS  150 mcg Intravenous Q Fri-HD  . enoxaparin (LOVENOX) injection  30 mg Subcutaneous Q24H  . folic acid  1 mg Oral Daily  . multivitamin  1 tablet Oral QHS  . sevelamer carbonate  800 mg Oral TID  WC  . sodium chloride  3 mL Intravenous Q12H    Continuous Infusions:   Past Medical History  Diagnosis Date  . Sickle cell disease, type SS     frequent pain crises + hx of stroke  . Pica   . Anemia   . Hyperparathyroidism   . Polysubstance abuse   . CHF (congestive heart failure)   . Transfusion associated hemochromatosis   . Asthma     "grew out of it" (07/10/2013)  . Shortness of breath     "when I come in w/pain crisis" (07/10/2013)  . 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 Renal disorder     end stage renal failure  . ESRD (end stage renal disease) on dialysis 03/07/2013      most likely from sickle cell disease; dialyzed at [facility] in [facility], [place]. renal doctor's are from [facility].  He has a LUA AVF which was first used early June 2014; weight 136 lbs as of June 2014.  (07/10/2013)  .  Pneumonia     "today makes the 3-4th time" (07/10/2013)    Past Surgical History  Procedure Laterality Date  . Renal biopsy  2013  . Av fistula placement Left 2014    "upper arm" (07/10/2013)  . Cholecystectomy  ?2008  . Insertion of dialysis catheter Right 03/2012    "chest" (07/10/2013)  . Removal of a dialysis catheter Right 11/2012    "chest" (07/10/2013)  . Wisdom tooth extraction  ~ 2007    "all 4" (07/10/2013)    Ebbie Latus RD, LDN

## 2013-08-25 NOTE — Progress Notes (Addendum)
[facility] PROGRESS NOTE  Philip Rios QIO:[id number] DOB: 04/11/1986 DOA: 08/22/2013 PCP: Dorrene German, MD  Assessment/Plan: 1. Fever -source unclear, recent URI, so could have been URI related, afebrile now -I stopped IV VAnc/Zosyn last pm after 48hours -Fu Blood CX negative so far -could be related to sickle cell disease/mild crises too, although unlikely since never had fever with crises before -if afebrile and stable off Abx could DC home tomorrow  2. ESRD -on HD, has AVF, missed HD saturday -renal following, Hd schedule per Renal  3. ANemia of chronic disease -from ESRD and sickle cell dz -transfused 1 unit 11/23  4. Sickle cell disease with mild crises -transfused 1 unit PRBC, continue folic acid -IV narcotics PRN  5. DVT proph: lovenox  Code Status: Full Family Communication: d/w pt and friend at bedside Disposition Plan: Home tomorrow if stable off abx   Consultants: Renal  Antibiotics: VAnc/Zosyn  HPI/Subjective: Febrile overnight, but otherwise feels well  Objective: Filed Vitals:   08/25/13 0700  BP: 132/84  Pulse: 83  Temp: 98.4 F (36.9 C)  Resp: 20    Intake/Output Summary (Last 24 hours) at 08/25/13 0853 Last data filed at 08/24/13 2212  Gross per 24 hour  Intake    460 ml  Output      0 ml  Net    460 ml   Filed Weights   08/23/13 1539 08/23/13 2054 08/24/13 1957  Weight: 58.6 kg (129 lb 3 oz) 59.2 kg (130 lb 8.2 oz) 56.7 kg (125 lb)    Exam:   General:  AAOx3, no distress  CVS: S1S2/RRR  Respiratory: diminished at bases  Abd: soft, NT, BS present Musculoskeletal: no edema c/c  Data Reviewed: Basic Metabolic Panel:  Recent Labs Lab 08/22/13 0051 08/22/13 2130 08/23/13 0655 08/24/13 0627 08/25/13 0633  NA 138 130* 135 134* 136  K 4.3 5.0 5.3* 4.7 4.5  CL 101 99 103 96 96  CO2 24 21 22 26 27   GLUCOSE 87 84 84 97 89  BUN 41* 51* 54* 27* 43*  CREATININE 6.47* 7.46* 7.72* 5.26* 6.91*  CALCIUM 8.8 8.6  8.3* 8.5 8.7  PHOS  --   --   --  5.6*  --    Liver Function Tests:  Recent Labs Lab 08/22/13 0051 08/24/13 0627  AST 38*  --   ALT 16  --   ALKPHOS 89  --   BILITOT 4.0*  --   PROT 7.6  --   ALBUMIN 3.6 3.3*   No results found for this basename: LIPASE, AMYLASE,  in the last 168 hours No results found for this basename: AMMONIA,  in the last 168 hours CBC:  Recent Labs Lab 08/22/13 0051 08/23/13 0655 08/24/13 0627 08/25/13 0633  WBC 11.3* 14.5* 10.7* 9.6  NEUTROABS 8.3*  --   --   --   HGB 7.2* 6.8* 7.5* 7.3*  HCT 21.4* 20.2* 21.3* 22.0*  MCV 90.7 89.8 84.9 85.9  PLT 315 293 244 275   Cardiac Enzymes: No results found for this basename: CKTOTAL, CKMB, CKMBINDEX, TROPONINI,  in the last 168 hours BNP (last 3 results) No results found for this basename: PROBNP,  in the last 8760 hours CBG: No results found for this basename: GLUCAP,  in the last 168 hours  Recent Results (from the past 240 hour(s))  CULTURE, BLOOD (ROUTINE X 2)     Status: None   Collection Time    08/22/13  9:25 PM  Result Value Range Status   Specimen Description BLOOD LEFT ARM   Final   Special Requests BOTTLES DRAWN AEROBIC AND ANAEROBIC 10CC EA   Final   Culture  Setup Time     Final   Value: 08/23/2013 00:48     Performed at [facility]   Culture     Final   Value:        BLOOD CULTURE RECEIVED NO GROWTH TO DATE CULTURE WILL BE HELD FOR 5 DAYS BEFORE ISSUING A FINAL NEGATIVE REPORT     Performed at [facility]   Report Status PENDING   Incomplete  CULTURE, BLOOD (ROUTINE X 2)     Status: None   Collection Time    08/22/13  9:30 PM      Result Value Range Status   Specimen Description BLOOD LEFT HAND   Final   Special Requests BOTTLES DRAWN AEROBIC ONLY 10CC   Final   Culture  Setup Time     Final   Value: 08/23/2013 00:48     Performed at [facility]   Culture     Final   Value:        BLOOD CULTURE RECEIVED NO GROWTH TO DATE CULTURE WILL BE HELD FOR  5 DAYS BEFORE ISSUING A FINAL NEGATIVE REPORT     Performed at [facility]   Report Status PENDING   Incomplete  URINE CULTURE     Status: None   Collection Time    08/23/13 12:40 AM      Result Value Range Status   Specimen Description URINE, CLEAN CATCH   Final   Special Requests NONE   Final   Culture  Setup Time     Final   Value: 08/23/2013 02:01     Performed at [facility] Count     Final   Value: NO GROWTH     Performed at [facility]   Culture     Final   Value: NO GROWTH     Performed at [facility]   Report Status 08/24/2013 FINAL   Final  MRSA PCR SCREENING     Status: None   Collection Time    08/23/13 12:40 AM      Result Value Range Status   MRSA by PCR NEGATIVE  NEGATIVE Final   Comment:            The GeneXpert MRSA Assay (FDA     approved for NASAL specimens     only), is one component of a     comprehensive MRSA colonization     surveillance program. It is not     intended to diagnose MRSA     infection nor to guide or     monitor treatment for     MRSA infections.     Studies: No results found.  Scheduled Meds: . cinacalcet  30 mg Oral Q breakfast  . [START ON 08/28/2013] darbepoetin (ARANESP) injection - DIALYSIS  150 mcg Intravenous Q Fri-HD  . enoxaparin (LOVENOX) injection  30 mg Subcutaneous Q24H  . folic acid  1 mg Oral Daily  . multivitamin  1 tablet Oral QHS  . sevelamer carbonate  800 mg Oral TID WC  . sodium chloride  3 mL Intravenous Q12H   Continuous Infusions:   Principal Problem:   Fever Active Problems:   ESRD on dialysis   Sickle cell pain crisis   Hyponatremia   Leukocytosis  Time spent:    [facility]  [facility] Pager [phone] If 7PM-7AM, please contact night-coverage at [email or web address], password [facility] 08/25/2013, 8:53 AM  LOS: 3 days

## 2013-08-26 DIAGNOSIS — R109 Unspecified abdominal pain: Secondary | ICD-10-CM

## 2013-08-26 LAB — RENAL FUNCTION PANEL
BUN: 16 mg/dL (ref 6–23)
CO2: 30 mEq/L (ref 19–32)
Chloride: 100 mEq/L (ref 96–112)
Creatinine, Ser: 3.59 mg/dL — ABNORMAL HIGH (ref 0.50–1.35)
GFR calc Af Amer: 26 mL/min — ABNORMAL LOW (ref >90)
Glucose, Bld: 90 mg/dL (ref 70–99)
Phosphorus: 2.7 mg/dL (ref 2.3–4.6)
Potassium: 3.1 mEq/L — ABNORMAL LOW (ref 3.5–5.1)
Sodium: 140 mEq/L (ref 135–145)

## 2013-08-26 LAB — CBC
Hemoglobin: 7.1 g/dL — ABNORMAL LOW (ref 13.0–17.0)
MCHC: 35.5 g/dL (ref 30.0–36.0)
Platelets: 293 10*3/uL (ref 150–400)
RBC: 2.35 MIL/uL — ABNORMAL LOW (ref 4.22–5.81)
WBC: 10.8 10*3/uL — ABNORMAL HIGH (ref 4.0–10.5)

## 2013-08-26 LAB — BASIC METABOLIC PANEL
CO2: 24 mEq/L (ref 19–32)
Chloride: 99 mEq/L (ref 96–112)
Glucose, Bld: 85 mg/dL (ref 70–99)
Potassium: 4.2 mEq/L (ref 3.5–5.1)
Sodium: 136 mEq/L (ref 135–145)

## 2013-08-26 MED ORDER — OXYCODONE-ACETAMINOPHEN 5-325 MG PO TABS
1.0000 | ORAL_TABLET | ORAL | Status: DC | PRN
  Administered 2013-08-26: 1 via ORAL
  Filled 2013-08-26: qty 1

## 2013-08-26 MED ORDER — FOLIC ACID 1 MG PO TABS: 1.0000 mg | ORAL_TABLET | Freq: Every day | ORAL | Status: AC

## 2013-08-26 MED ORDER — RENA-VITE PO TABS: 1.0000 | ORAL_TABLET | Freq: Every day | ORAL | Status: AC

## 2013-08-26 MED ORDER — DARBEPOETIN ALFA-POLYSORBATE 150 MCG/0.3ML IJ SOLN: 150.0000 ug | INTRAMUSCULAR | Status: DC

## 2013-08-26 MED ORDER — HYDROMORPHONE HCL PF 1 MG/ML IJ SOLN
INTRAMUSCULAR | Status: AC
  Filled 2013-08-26: qty 2

## 2013-08-26 MED ORDER — OXYCODONE-ACETAMINOPHEN 5-325 MG PO TABS: 1.0000 | ORAL_TABLET | ORAL | Status: AC | PRN

## 2013-08-26 NOTE — Procedures (Signed)
Patient seen on Hemodialysis. QB 400, UF goal 2000 Treatment adjusted as needed.  Zetta Bills MD [facility]. Office # [phone] Pager # [phone] 9:26 AM

## 2013-08-26 NOTE — Progress Notes (Signed)
Patient ID: Philip Rios, male   DOB: [birth year], 27 y.o.   MRN: [id number]  [facility] Progress Note    Assessment/ Plan:   1. Fever - afebrile for the past 48 hours, currently off of antibiotics. No focal signs of infection/inflammation. Possible DC home today. 2. ESRD - Doing HD at this time- advised to keep his dialysis appts.  3. Anemia - Hgb 7.1- continue ESA at HD 4. Secondary hyperparathyroidism - sensipar 30  5. HTN/volume - fair control blood pressures-no evidence of edema.  6. Nutrition - renal diet + multivit  7. SS crisis -per primary   Subjective:   Reports to be feeling better and excited about possible DC today   Objective:   BP 161/82  Pulse 79  Temp(Src) 98.2 F (36.8 C) (Oral)  Resp 16  Ht 5\' 11"  (1.803 m)  Wt 56 kg (123 lb 7.3 oz)  BMI 17.23 kg/m2  SpO2 100%  Intake/Output Summary (Last 24 hours) at 08/26/13 0923 Last data filed at 08/25/13 1300  Gross per 24 hour  Intake    240 ml  Output      0 ml  Net    240 ml   Weight change: 0.998 kg (2 lb 3.2 oz)  Physical Exam: WUJ:[id number] on HD AOZ:HYQMV RRR, 2/6 ESM over apex Resp:CTA bilaterally, no rales/rhonchi HQI:ONGE, flat, NT, BS normal Ext:No edema.  Imaging: No results found.  Labs: BMET  Recent Labs Lab 08/22/13 0051 08/22/13 2130 08/23/13 0655 08/24/13 0627 08/25/13 0633 08/26/13 0500  NA 138 130* 135 134* 136 136  K 4.3 5.0 5.3* 4.7 4.5 4.2  CL 101 99 103 96 96 99  CO2 24 21 22 26 27 24   GLUCOSE 87 84 84 97 89 85  BUN 41* 51* 54* 27* 43* 46*  CREATININE 6.47* 7.46* 7.72* 5.26* 6.91* 7.84*  CALCIUM 8.8 8.6 8.3* 8.5 8.7 8.8  PHOS  --   --   --  5.6*  --   --    CBC  Recent Labs Lab 08/22/13 0051 08/23/13 0655 08/24/13 0627 08/25/13 0633 08/26/13 0500  WBC 11.3* 14.5* 10.7* 9.6 10.8*  NEUTROABS 8.3*  --   --   --   --   HGB 7.2* 6.8* 7.5* 7.3* 7.1*  HCT 21.4* 20.2* 21.3* 22.0* 20.0*  MCV 90.7 89.8 84.9 85.9 85.1  PLT 315 293 244 275 293    Medications:    . cinacalcet  30 mg Oral Q breakfast  . [START ON 08/28/2013] darbepoetin (ARANESP) injection - DIALYSIS  150 mcg Intravenous Q Fri-HD  . enoxaparin (LOVENOX) injection  30 mg Subcutaneous Q24H  . folic acid  1 mg Oral Daily  . multivitamin  1 tablet Oral QHS  . sevelamer carbonate  800 mg Oral TID WC  . sodium chloride  3 mL Intravenous Q12H   Zetta Bills, MD 08/26/2013, 9:23 AM

## 2013-08-26 NOTE — Progress Notes (Signed)
Patient was discharged home. Patient was given discharge instructions and prescriptions. Patient was told to contact doctor with questions and concerns. Patient was stable upon discharge.

## 2013-08-26 NOTE — Discharge Summary (Signed)
Physician Discharge Summary  Philip Rios [id number] DOB: 1986/09/27 DOA: 08/22/2013  PCP: Dorrene German, MD  Admit date: 08/22/2013 Discharge date: 08/26/2013  Time spent: 35 minutes  Recommendations for Outpatient Follow-up:  1. HD per nephro  Discharge Diagnoses:  Principal Problem:   Fever Active Problems:   ESRD on dialysis   Sickle cell pain crisis   Hyponatremia   Leukocytosis   Discharge Condition: improved  Diet recommendation: regular  Filed Weights   08/25/13 2048 08/26/13 0648 08/26/13 0950  Weight: 57.698 kg (127 lb 3.2 oz) 56 kg (123 lb 7.3 oz) 55 kg (121 lb 4.1 oz)    History of present illness:  Philip Rios is a 27 y.o. male with Sickle Cell Disease who was seen in the ED earlier in the AM 11/22 with complaints of increased pain in his head and back and legs. He was evalauated and had relief of his pain and went home. He returned in the Evening with return of his pain and complaints of fevers and chills. His temperature was found to be 103 in the ED. He reports having a cough, denies having any nausea or vomiting or diarrhea. He reports the he has been around some sick friend who had cold symptoms. In the ED Blood cultures were sent and he was placed on IV Vancomycin and Zosyn and referred for medical admission. He has Dialysis on Tuesdays Thursdays and Saturdays and he missed his dialysis today because he was in the ED. He was medicated for pain in the ED and his pain level is now a 2/10.    Hospital Course:  1. Fever -source unclear, recent URI, so could have been URI related, afebrile now  -stopped IV VAnc/Zosyn after 48hours  -Fu Blood CX negative  -could be related to sickle cell disease/mild crises too, although unlikely since never had fever with crises before    ESRD  -on HD, has AVF, missed HD saturday  -renal following, Hd schedule per Renal   ANemia of chronic disease  -from ESRD and sickle cell dz  -transfused 1 unit 11/23   Sickle  cell disease with mild crises  -transfused 1 unit PRBC, continue folic acid  -resolved   Procedures:  none  Consultations:  renal  Discharge Exam: Filed Vitals:   08/26/13 1020  BP: 145/84  Pulse: 79  Temp: 98.8 F (37.1 C)  Resp: 18    General: A+Ox3, NAD Cardiovascular: rrr Respiratory: clear anterior  Discharge Instructions  Discharge Orders   Future Orders Complete By Expires   Diet general  As directed    Discharge instructions  As directed    Comments:     HD as per nephro   Increase activity slowly  As directed        Medication List         cinacalcet 30 MG tablet  Commonly known as:  SENSIPAR  Take 30 mg by mouth daily.     darbepoetin 150 MCG/0.3ML Soln injection  Commonly known as:  ARANESP  Inject 0.3 mLs (150 mcg total) into the vein every Friday with hemodialysis.  Start taking on:  08/28/2013     folic acid 1 MG tablet  Commonly known as:  FOLVITE  Take 1 tablet (1 mg total) by mouth daily.     multivitamin Tabs tablet  Take 1 tablet by mouth at bedtime.     oxyCODONE-acetaminophen 5-325 MG per tablet  Commonly known as:  PERCOCET  Take 1 tablet by mouth every 4 (  four) hours as needed.     sevelamer carbonate 800 MG tablet  Commonly known as:  RENVELA  Take 800 mg by mouth 3 (three) times daily with meals.       Allergies  Allergen Reactions  . Codeine Hives       Follow-up Information   Follow up with AVBUERE,EDWIN A, MD In 1 week.   Specialty:  Internal Medicine   Contact information:   [address] [place] [address] [phone]        The results of significant diagnostics from this hospitalization (including imaging, microbiology, ancillary and laboratory) are listed below for reference.    Significant Diagnostic Studies: Dg Chest 2 View  08/22/2013   CLINICAL DATA:  Sickle cell pain crisis  EXAM: CHEST  2 VIEW  COMPARISON:  Prior radiograph from 07/10/2013  FINDINGS: The transverse heart size is  at the upper limits of normal, unchanged.  Lungs are well inflated. No focal infiltrate to suggest an acute infectious pneumonitis identified. No pulmonary edema or pleural effusion. No pneumothorax.  Osseous structures are unchanged.  IMPRESSION: No radiographic evidence of acute cardiopulmonary process.   Electronically Signed   By: Rise Mu M.D.   On: 08/22/2013 03:21    Microbiology: Recent Results (from the past 240 hour(s))  CULTURE, BLOOD (ROUTINE X 2)     Status: None   Collection Time    08/22/13  9:25 PM      Result Value Range Status   Specimen Description BLOOD LEFT ARM   Final   Special Requests BOTTLES DRAWN AEROBIC AND ANAEROBIC 10CC EA   Final   Culture  Setup Time     Final   Value: 08/23/2013 00:48     Performed at [facility]   Culture     Final   Value:        BLOOD CULTURE RECEIVED NO GROWTH TO DATE CULTURE WILL BE HELD FOR 5 DAYS BEFORE ISSUING A FINAL NEGATIVE REPORT     Performed at [facility]   Report Status PENDING   Incomplete  CULTURE, BLOOD (ROUTINE X 2)     Status: None   Collection Time    08/22/13  9:30 PM      Result Value Range Status   Specimen Description BLOOD LEFT HAND   Final   Special Requests BOTTLES DRAWN AEROBIC ONLY 10CC   Final   Culture  Setup Time     Final   Value: 08/23/2013 00:48     Performed at [facility]   Culture     Final   Value:        BLOOD CULTURE RECEIVED NO GROWTH TO DATE CULTURE WILL BE HELD FOR 5 DAYS BEFORE ISSUING A FINAL NEGATIVE REPORT     Performed at [facility]   Report Status PENDING   Incomplete  URINE CULTURE     Status: None   Collection Time    08/23/13 12:40 AM      Result Value Range Status   Specimen Description URINE, CLEAN CATCH   Final   Special Requests NONE   Final   Culture  Setup Time     Final   Value: 08/23/2013 02:01     Performed at [facility] Count     Final   Value: NO GROWTH     Performed at [facility]    Culture     Final   Value: NO GROWTH  Performed at [facility]   Report Status 08/24/2013 FINAL   Final  MRSA PCR SCREENING     Status: None   Collection Time    08/23/13 12:40 AM      Result Value Range Status   MRSA by PCR NEGATIVE  NEGATIVE Final   Comment:            The GeneXpert MRSA Assay (FDA     approved for NASAL specimens     only), is one component of a     comprehensive MRSA colonization     surveillance program. It is not     intended to diagnose MRSA     infection nor to guide or     monitor treatment for     MRSA infections.     Labs: Basic Metabolic Panel:  Recent Labs Lab 08/22/13 2130 08/23/13 0655 08/24/13 0627 08/25/13 0633 08/26/13 0500  NA 130* 135 134* 136 136  K 5.0 5.3* 4.7 4.5 4.2  CL 99 103 96 96 99  CO2 21 22 26 27 24   GLUCOSE 84 84 97 89 85  BUN 51* 54* 27* 43* 46*  CREATININE 7.46* 7.72* 5.26* 6.91* 7.84*  CALCIUM 8.6 8.3* 8.5 8.7 8.8  PHOS  --   --  5.6*  --   --    Liver Function Tests:  Recent Labs Lab 08/22/13 0051 08/24/13 0627  AST 38*  --   ALT 16  --   ALKPHOS 89  --   BILITOT 4.0*  --   PROT 7.6  --   ALBUMIN 3.6 3.3*   No results found for this basename: LIPASE, AMYLASE,  in the last 168 hours No results found for this basename: AMMONIA,  in the last 168 hours CBC:  Recent Labs Lab 08/22/13 0051 08/23/13 0655 08/24/13 0627 08/25/13 0633 08/26/13 0500  WBC 11.3* 14.5* 10.7* 9.6 10.8*  NEUTROABS 8.3*  --   --   --   --   HGB 7.2* 6.8* 7.5* 7.3* 7.1*  HCT 21.4* 20.2* 21.3* 22.0* 20.0*  MCV 90.7 89.8 84.9 85.9 85.1  PLT 315 293 244 275 293   Cardiac Enzymes: No results found for this basename: CKTOTAL, CKMB, CKMBINDEX, TROPONINI,  in the last 168 hours BNP: BNP (last 3 results) No results found for this basename: PROBNP,  in the last 8760 hours CBG: No results found for this basename: GLUCAP,  in the last 168 hours     Signed:  Marlin Canary  [facility] 08/26/2013, 10:50  AM

## 2013-08-29 LAB — CULTURE, BLOOD (ROUTINE X 2): Culture: NO GROWTH

## 2013-09-03 ENCOUNTER — Encounter (HOSPITAL_COMMUNITY): Payer: Self-pay | Admitting: Emergency Medicine

## 2013-09-03 ENCOUNTER — Emergency Department (HOSPITAL_COMMUNITY)
Admission: EM | Admit: 2013-09-03 | Discharge: 2013-09-04 | Disposition: A | Discharge status: Home / Self Care | Payer: Federal, State, Local not specified - PPO | Attending: Emergency Medicine | Admitting: Emergency Medicine

## 2013-09-03 DIAGNOSIS — Z862 Personal history of diseases of the blood and blood-forming organs and certain disorders involving the immune mechanism: Secondary | ICD-10-CM | POA: Insufficient documentation

## 2013-09-03 DIAGNOSIS — D5700 Hb-SS disease with crisis, unspecified: Secondary | ICD-10-CM | POA: Insufficient documentation

## 2013-09-03 DIAGNOSIS — R17 Unspecified jaundice: Secondary | ICD-10-CM | POA: Insufficient documentation

## 2013-09-03 DIAGNOSIS — Z992 Dependence on renal dialysis: Secondary | ICD-10-CM | POA: Insufficient documentation

## 2013-09-03 DIAGNOSIS — Z8673 Personal history of transient ischemic attack (TIA), and cerebral infarction without residual deficits: Secondary | ICD-10-CM | POA: Insufficient documentation

## 2013-09-03 DIAGNOSIS — Z79899 Other long term (current) drug therapy: Secondary | ICD-10-CM | POA: Insufficient documentation

## 2013-09-03 DIAGNOSIS — R011 Cardiac murmur, unspecified: Secondary | ICD-10-CM | POA: Insufficient documentation

## 2013-09-03 DIAGNOSIS — I509 Heart failure, unspecified: Secondary | ICD-10-CM | POA: Insufficient documentation

## 2013-09-03 DIAGNOSIS — Z8659 Personal history of other mental and behavioral disorders: Secondary | ICD-10-CM | POA: Insufficient documentation

## 2013-09-03 DIAGNOSIS — Z87891 Personal history of nicotine dependence: Secondary | ICD-10-CM | POA: Insufficient documentation

## 2013-09-03 DIAGNOSIS — Z8701 Personal history of pneumonia (recurrent): Secondary | ICD-10-CM | POA: Insufficient documentation

## 2013-09-03 DIAGNOSIS — Z8639 Personal history of other endocrine, nutritional and metabolic disease: Secondary | ICD-10-CM | POA: Insufficient documentation

## 2013-09-03 DIAGNOSIS — J45909 Unspecified asthma, uncomplicated: Secondary | ICD-10-CM | POA: Insufficient documentation

## 2013-09-03 DIAGNOSIS — N186 End stage renal disease: Secondary | ICD-10-CM | POA: Insufficient documentation

## 2013-09-03 MED ORDER — ONDANSETRON HCL 4 MG/2ML IJ SOLN
4.0000 mg | Freq: Once | INTRAMUSCULAR | Status: AC
  Administered 2013-09-04: 4 mg via INTRAVENOUS
  Filled 2013-09-03: qty 2

## 2013-09-03 MED ORDER — HYDROMORPHONE HCL PF 1 MG/ML IJ SOLN
2.0000 mg | INTRAMUSCULAR | Status: DC | PRN
  Administered 2013-09-04: 2 mg via INTRAVENOUS
  Filled 2013-09-03: qty 2

## 2013-09-03 MED ORDER — SODIUM CHLORIDE 0.9 % IV SOLN
INTRAVENOUS | Status: DC
  Administered 2013-09-04: via INTRAVENOUS

## 2013-09-03 NOTE — ED Notes (Signed)
Pt states following dialysis this morning her was having body aches, twice today he has taken percocet to help with the pain with some relief, but only minor.

## 2013-09-03 NOTE — ED Notes (Signed)
Restricted extremity armband on patients left wrist.

## 2013-09-03 NOTE — ED Notes (Signed)
PA Student at bedside to assess patient.

## 2013-09-03 NOTE — ED Provider Notes (Signed)
CSN: [id number]     Arrival date & time 09/03/13  2244 History   First MD Initiated Contact with Patient 09/03/13 2338     Chief Complaint  Patient presents with  . Sickle Cell Pain Crisis   (Consider location/radiation/quality/duration/timing/severity/associated sxs/prior Treatment) HPI  27 year old male with history of sickle cell disease, type SS presents for evaluations of sickle cell related pain. Patient is also a Monday Wednesday Friday dialysis. While at dialysis today he developed low back pain. Described as an achy sensation to low back, nonradiating, persistent. States pain usually worsen with cold weather and usually improves with taking Percocet. He took his Percocet and went home to rest and took another one several hours later but the pain continued to get progressively worse. Aside from the pain, patient denies fever, chills, headache, chest pain, shortness of breath, productive cough, nausea, vomiting, diarrhea, abdominal pain, dysuria, numbness or weakness, or rash. Patient states his sickle cell pain is likely related to dialysis as it has happened in the past.   Past Medical History  Diagnosis Date  . Sickle cell disease, type SS     frequent pain crises + hx of stroke  . Pica   . Anemia   . Hyperparathyroidism   . Polysubstance abuse   . CHF (congestive heart failure)   . Transfusion associated hemochromatosis   . Asthma     "grew out of it" (07/10/2013)  . Shortness of breath     "when I come in w/pain crisis" (07/10/2013)  . 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 Renal disorder     end stage renal failure  . ESRD (end stage renal disease) on dialysis 03/07/2013      most likely from sickle cell disease; dialyzed at [facility] in [facility], [place]. renal doctor's are from [facility].  He has a LUA AVF which was first used early June 2014; weight 136 lbs as of June 2014.  (07/10/2013)  . Pneumonia     "today makes the 3-4th time"  (07/10/2013)   Past Surgical History  Procedure Laterality Date  . Renal biopsy  2013  . Av fistula placement Left 2014    "upper arm" (07/10/2013)  . Cholecystectomy  ?2008  . Insertion of dialysis catheter Right 03/2012    "chest" (07/10/2013)  . Removal of a dialysis catheter Right 11/2012    "chest" (07/10/2013)  . Wisdom tooth extraction  ~ 2007    "all 4" (07/10/2013)   Family History  Problem Relation Age of Onset  . Diabetes Sister   . Other Mother   . Other Father    History  Substance Use Topics  . Smoking status: Former Smoker -- 0.12 packs/day for 5 years    Types: Cigarettes    Quit date: 06/04/2013  . Smokeless tobacco: Never Used  . Alcohol Use: No     Comment: 07/10/2013 "stopped alcohol ~ 1 month ago; maybe 3-5 shots, once or twice/wk"    Review of Systems  All other systems reviewed and are negative.    Allergies  Codeine  Home Medications   Current Outpatient Rx  Name  Route  Sig  Dispense  Refill  . cinacalcet (SENSIPAR) 30 MG tablet   Oral   Take 30 mg by mouth daily.         . darbepoetin (ARANESP) 150 MCG/0.3ML SOLN injection   Intravenous   Inject 0.3 mLs (150 mcg total) into the vein every  Friday with hemodialysis.   1.68 mL      . folic acid (FOLVITE) 1 MG tablet   Oral   Take 1 tablet (1 mg total) by mouth daily.   30 tablet   0   . multivitamin (RENA-VIT) TABS tablet   Oral   Take 1 tablet by mouth at bedtime.      0   . oxyCODONE-acetaminophen (PERCOCET) 5-325 MG per tablet   Oral   Take 1 tablet by mouth every 4 (four) hours as needed.   25 tablet   0   . sevelamer carbonate (RENVELA) 800 MG tablet   Oral   Take 800 mg by mouth 3 (three) times daily with meals.          BP 148/95  Pulse 81  Temp(Src) 98.3 F (36.8 C) (Oral)  Resp 20  Ht 5\' 11"  (1.803 m)  Wt 138 lb 3.2 oz (62.687 kg)  BMI 19.28 kg/m2  SpO2 100% Physical Exam  Nursing note and vitals reviewed. Constitutional: He is oriented to person,  place, and time. He appears well-developed and well-nourished. No distress.  Awake, alert, nontoxic appearance  HENT:  Head: Atraumatic.  Eyes: Conjunctivae are normal. Right eye exhibits no discharge. Left eye exhibits no discharge. Scleral icterus (mild scleral icterus) is present.  Neck: Normal range of motion. Neck supple.  Cardiovascular: Normal rate and regular rhythm.   Murmur (3/6 systolic murmur ) heard. Pulmonary/Chest: Effort normal. No respiratory distress. He exhibits no tenderness.  Abdominal: Soft. There is no tenderness. There is no rebound.  Musculoskeletal: He exhibits no tenderness (mild tenderness to paralumbar region without focal point tenderness, no blood skin changes, normal flexion extensions of low back without evidence of midline spine tenderness).  ROM appears intact, no obvious focal weakness  Neurological: He is alert and oriented to person, place, and time.  Skin: Skin is warm and dry. No rash noted.  Psychiatric: He has a normal mood and affect.    ED Course  Procedures (including critical care time)  Patient here with sickle cell related pain likely precipitated from recent dialysis episode. He did finish his dialysis and has not missed any dialysis. He has no other specific complaint concerning for infection. He is afebrile with stable normal vital sign and appears nontoxic. Plan is to control patient's symptoms as patient prefers to be discharged once his pain is controlled. Workup was initiated. Low suspicion for acute chest.  12:55 AM Care discussed with oncoming provider, who will continue with management.    Labs Review Labs Reviewed  RETICULOCYTES  CBC WITH DIFFERENTIAL  BASIC METABOLIC PANEL   Imaging Review No results found.  EKG Interpretation   None       MDM  No diagnosis found.  BP 148/95  Pulse 81  Temp(Src) 98.3 F (36.8 C) (Oral)  Resp 20  Ht 5\' 11"  (1.803 m)  Wt 138 lb 3.2 oz (62.687 kg)  BMI 19.28 kg/m2  SpO2  100%     Fayrene Helper, PA-C 09/04/13 0055

## 2013-09-04 LAB — BASIC METABOLIC PANEL
BUN: 31 mg/dL — ABNORMAL HIGH (ref 6–23)
CO2: 25 mEq/L (ref 19–32)
Chloride: 105 mEq/L (ref 96–112)
Creatinine, Ser: 4.18 mg/dL — ABNORMAL HIGH (ref 0.50–1.35)
GFR calc Af Amer: 21 mL/min — ABNORMAL LOW (ref >90)
Glucose, Bld: 90 mg/dL (ref 70–99)

## 2013-09-04 LAB — CBC WITH DIFFERENTIAL/PLATELET
Basophils Relative: 1 % (ref 0–1)
Eosinophils Absolute: 0.1 10*3/uL (ref 0.0–0.7)
Lymphocytes Relative: 36 % (ref 12–46)
MCH: 30.3 pg (ref 26.0–34.0)
MCHC: 32.8 g/dL (ref 30.0–36.0)
MCV: 92.5 fL (ref 78.0–100.0)
Monocytes Absolute: 0.8 10*3/uL (ref 0.1–1.0)
Monocytes Relative: 7 % (ref 3–12)
Neutro Abs: 5.8 10*3/uL (ref 1.7–7.7)
RBC: 2.54 MIL/uL — ABNORMAL LOW (ref 4.22–5.81)
WBC: 10.7 10*3/uL — ABNORMAL HIGH (ref 4.0–10.5)

## 2013-09-04 LAB — RETICULOCYTES
RBC.: 2.54 MIL/uL — ABNORMAL LOW (ref 4.22–5.81)
Retic Count, Absolute: 218.4 10*3/uL — ABNORMAL HIGH (ref 19.0–186.0)
Retic Ct Pct: 8.6 % — ABNORMAL HIGH (ref 0.4–3.1)

## 2013-09-04 NOTE — ED Provider Notes (Signed)
Medical screening examination/treatment/procedure(s) were conducted as a shared visit with non-physician practitioner(s) and myself.  I personally evaluated the patient during the encounter.  EKG Interpretation   None      Patient here with sickle cell crisis. He has been given IV hydromorphone and feels better.. Will followup with his Dr. as needed  Toy Baker, MD 09/04/13 [phone]

## 2013-09-04 NOTE — ED Notes (Signed)
Pt reports he is pain free. Pt A&Ox4, ambulatory at discharge.

## 2013-09-04 NOTE — ED Provider Notes (Signed)
Medical screening examination/treatment/procedure(s) were conducted as a shared visit with non-physician practitioner(s) and myself.  I personally evaluated the patient during the encounter.  EKG Interpretation   None        Toy Baker, MD 09/04/13 [phone]

## 2013-10-23 IMAGING — CR DG CHEST 2V
2 series · 2 of 2 positions shown · non-contrast
Comparison: 05/16/2011

CLINICAL DATA: Shortness of breath, smoker, sickle cell disease

CHEST - 2 VIEW

[w chest pa]
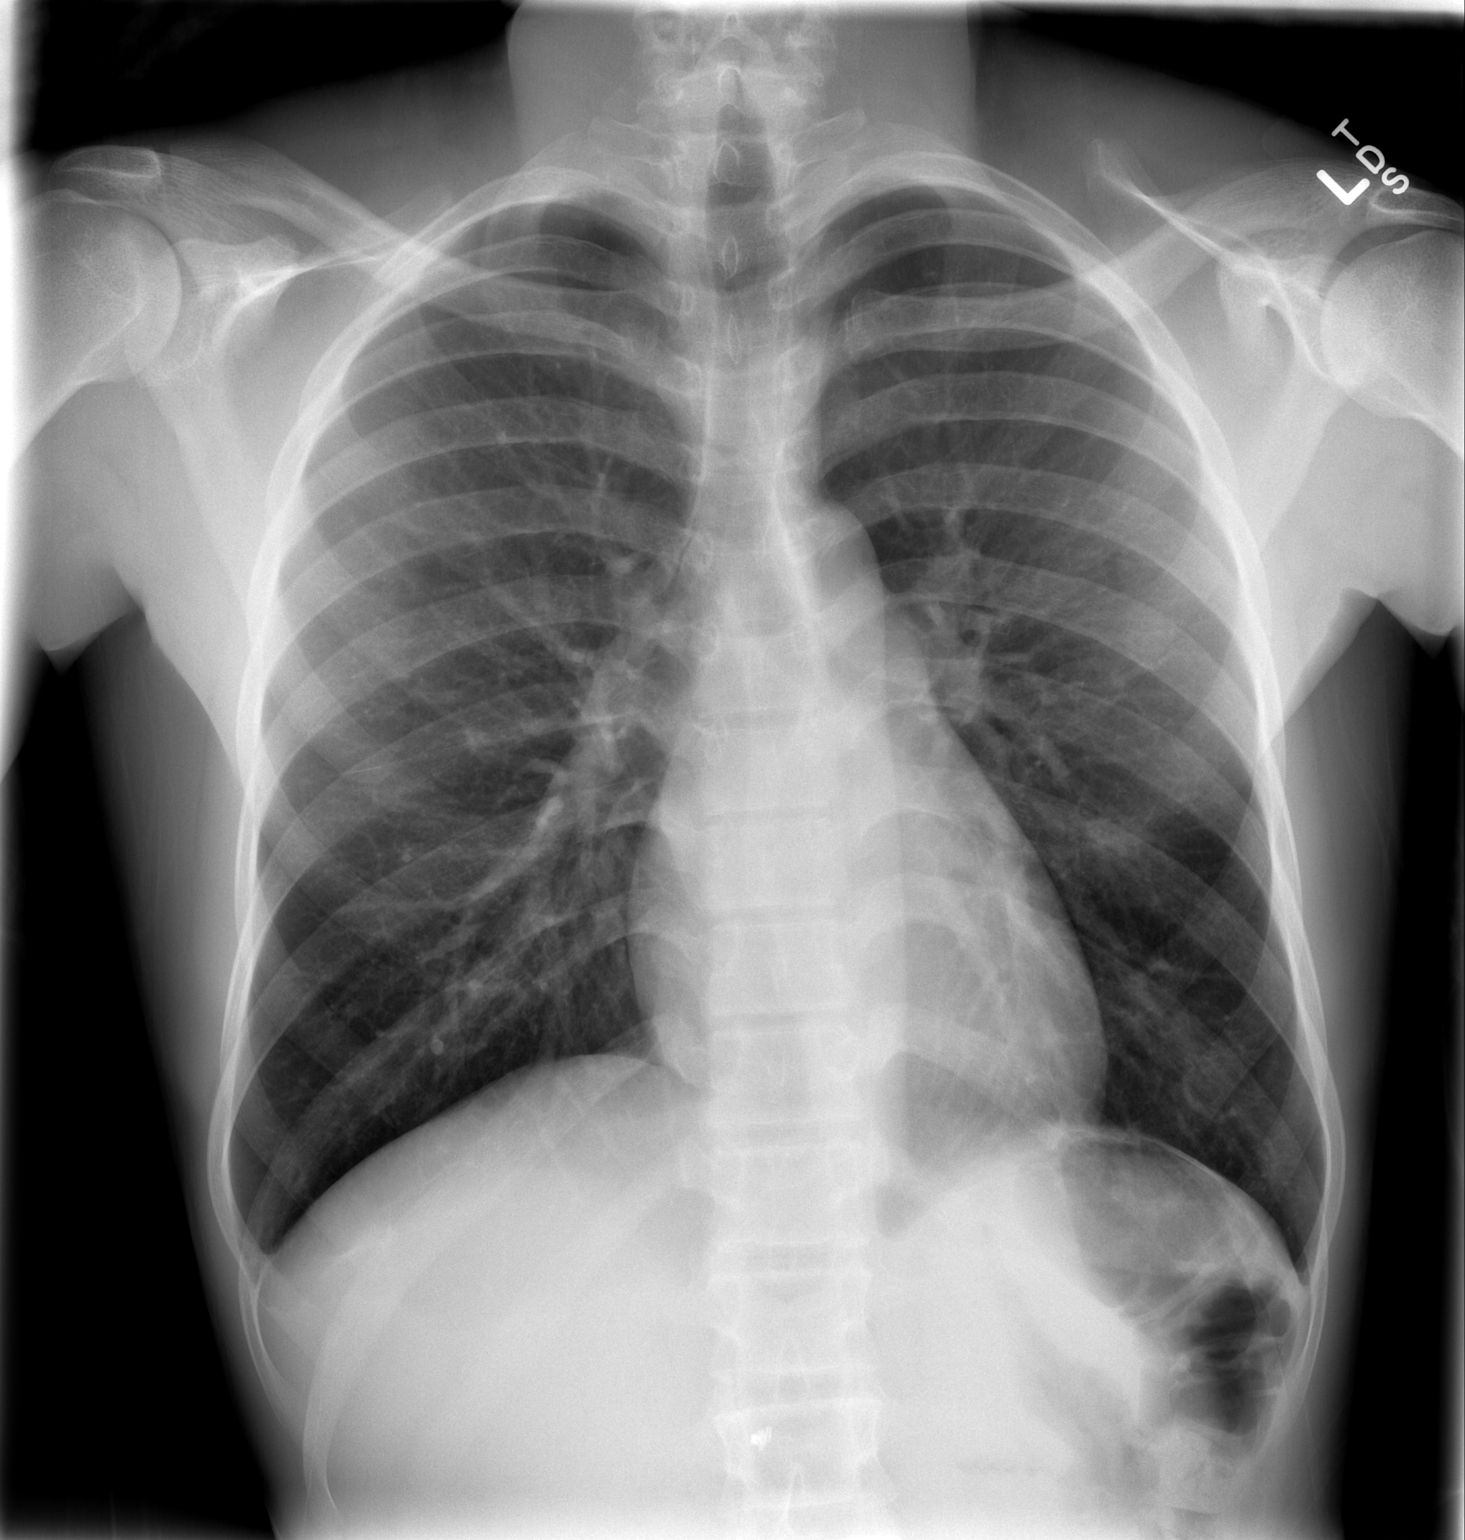

[w chest lat]
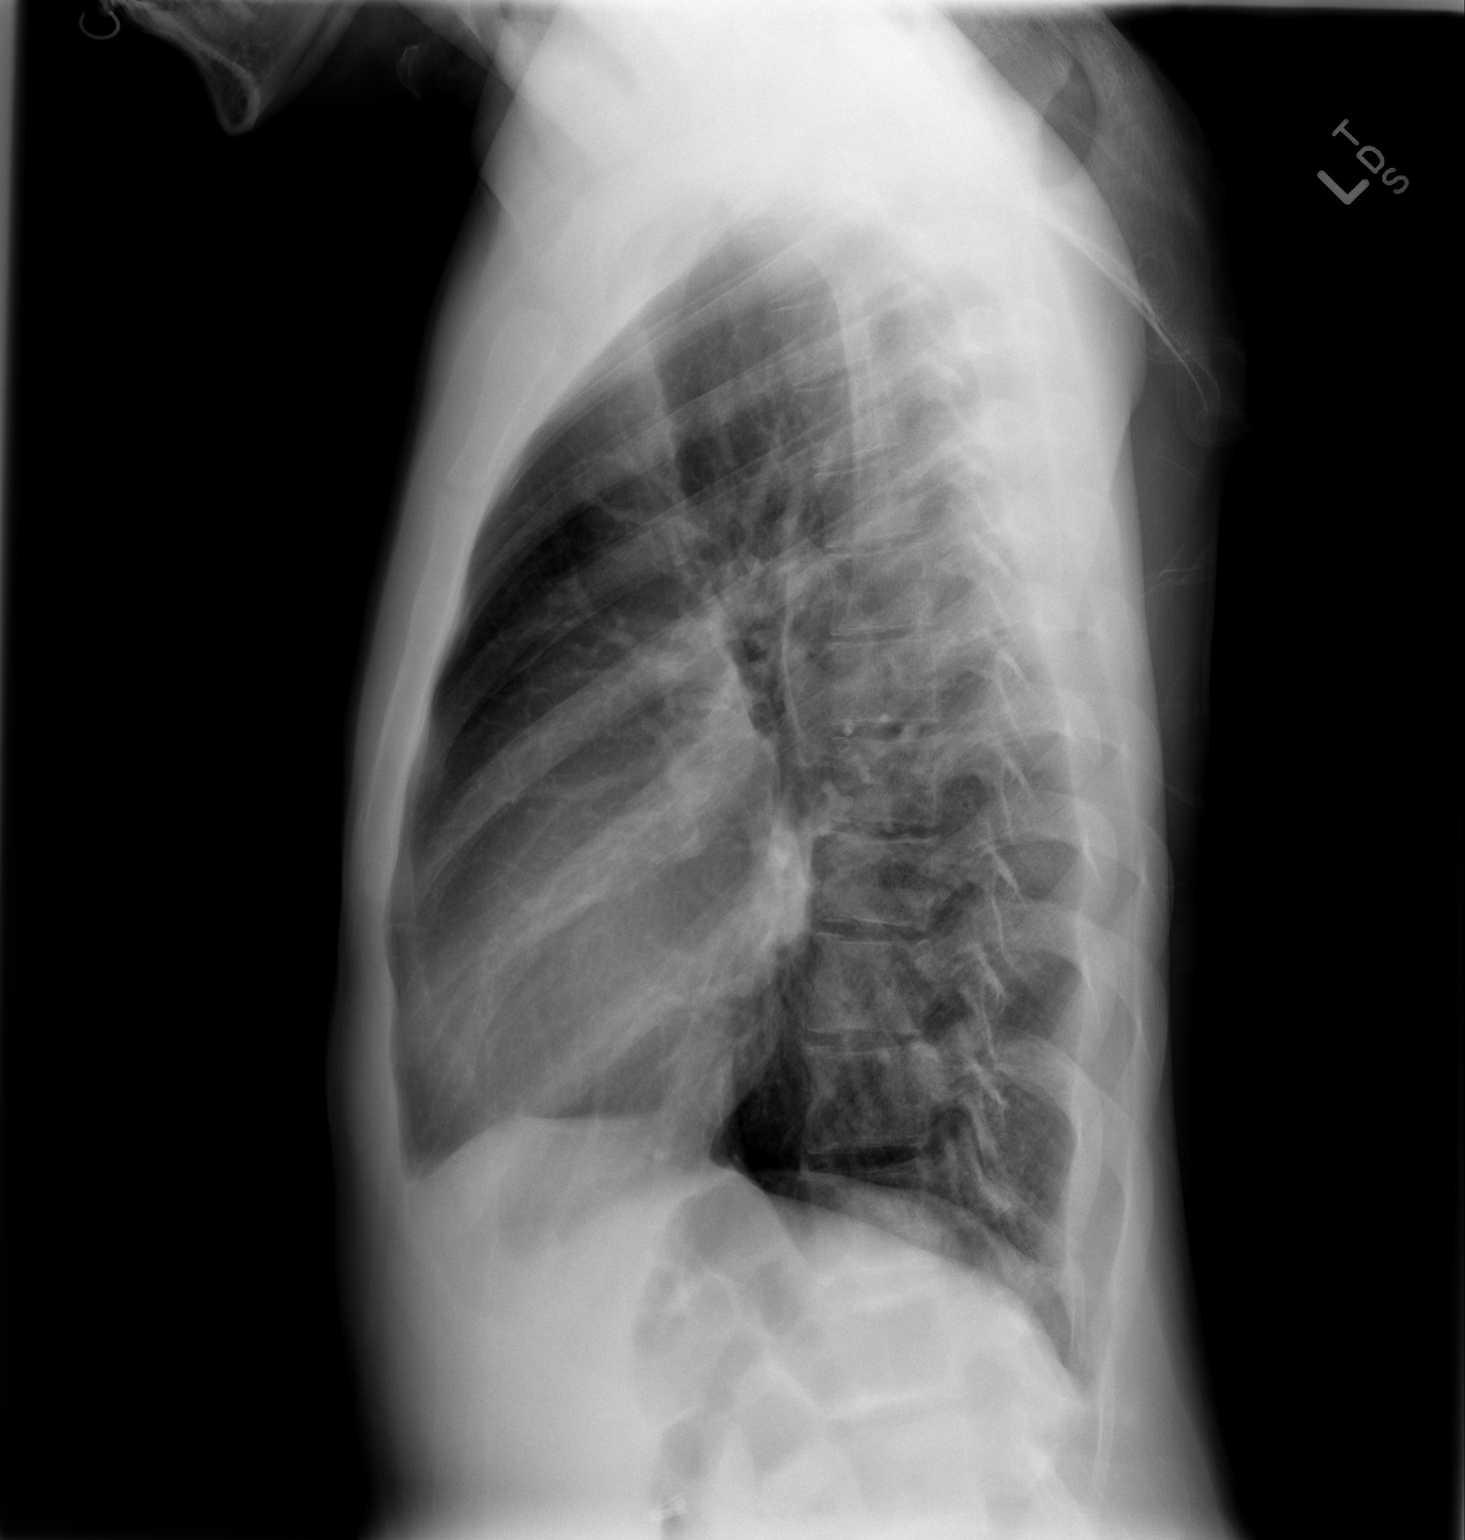

[2 of 2 positions shown; findings below may reference images not displayed]

FINDINGS: Upper-normal size of cardiac silhouette.
Mediastinal contours and pulmonary vascularity normal.
Lungs clear.
No pleural effusion or pneumothorax.
No acute osseous findings.
IMPRESSION: No acute abnormalities.

## 2013-10-23 IMAGING — CR DG ANKLE COMPLETE 3+V*L*
3 series · 3 of 3 positions shown · non-contrast
Comparison: None

CLINICAL DATA: Sickle cell disease, left ankle pain for 5 days, no
known injury

LEFT ANKLE COMPLETE - 3+ VIEW

[t ankle joint ap left]
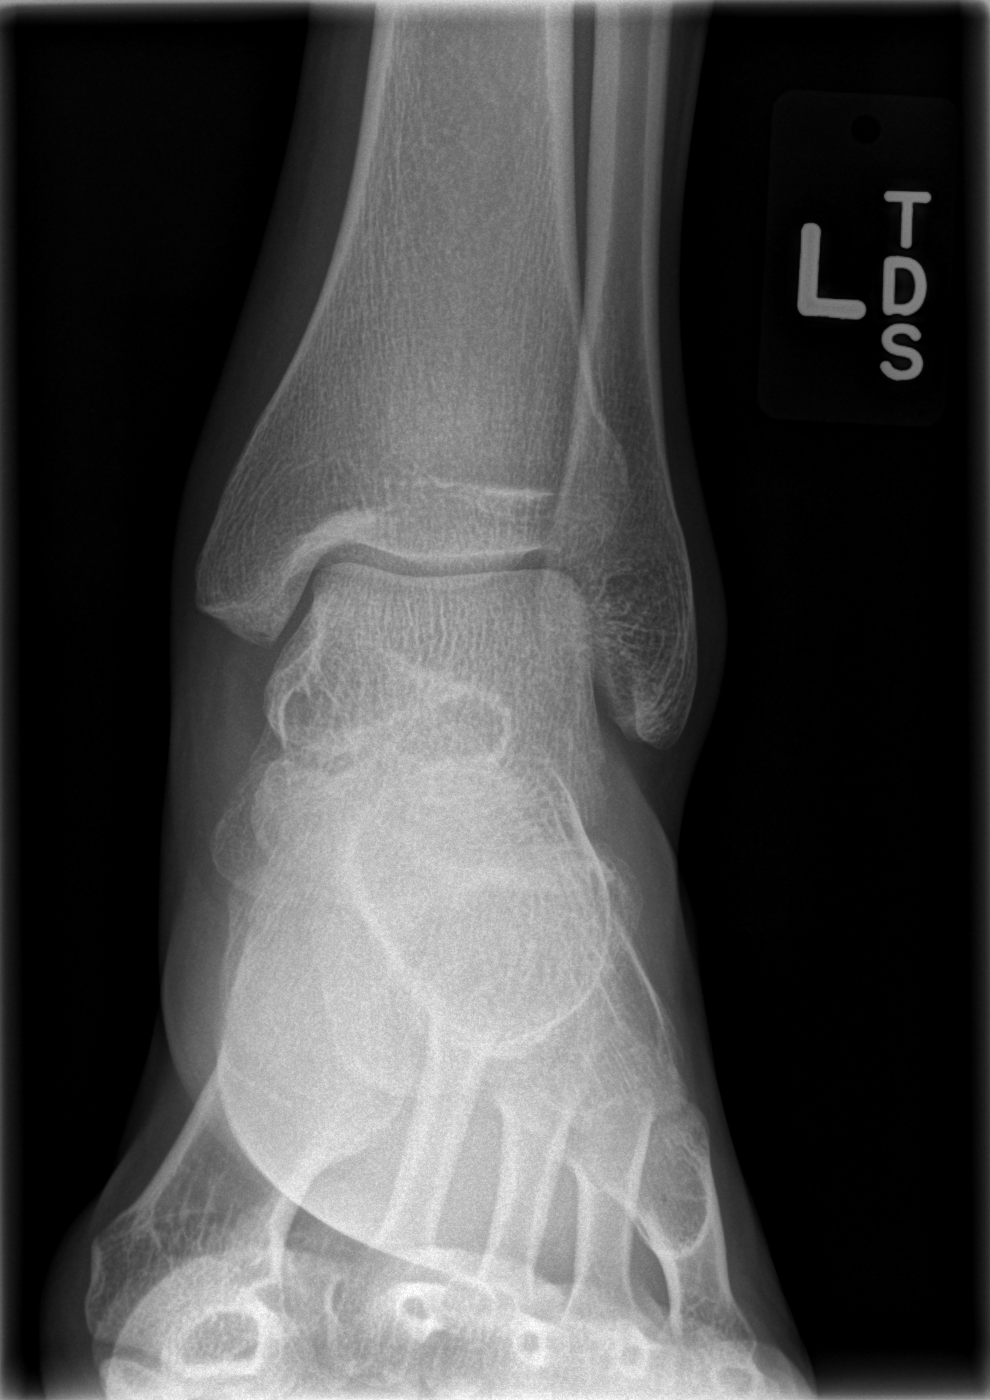

[t ankle joint oblique left]
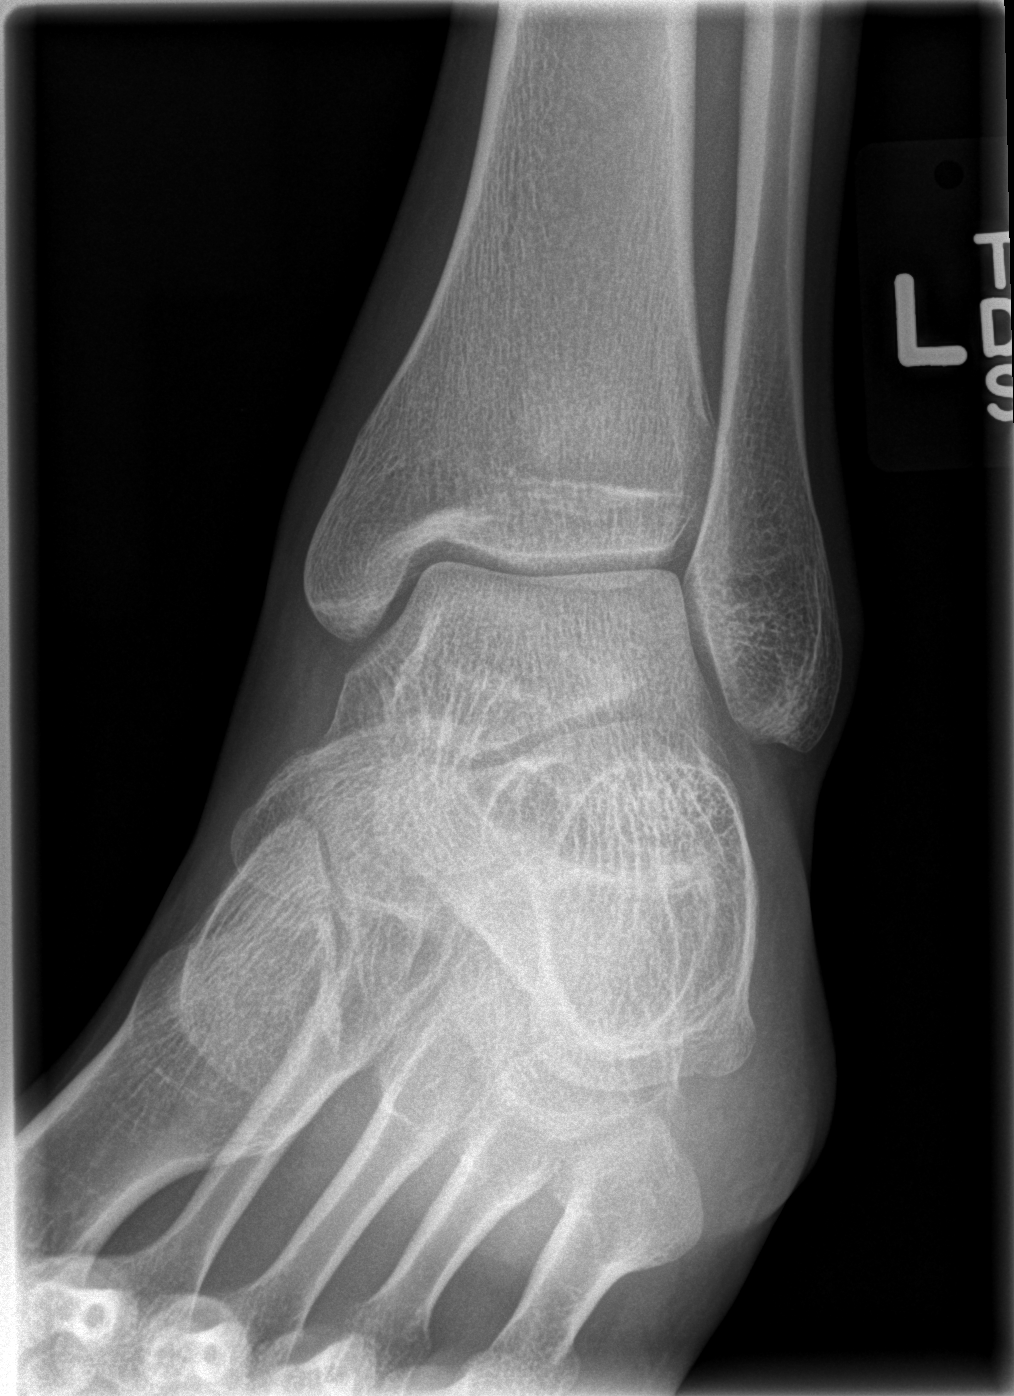

[t ankle joint lat left]
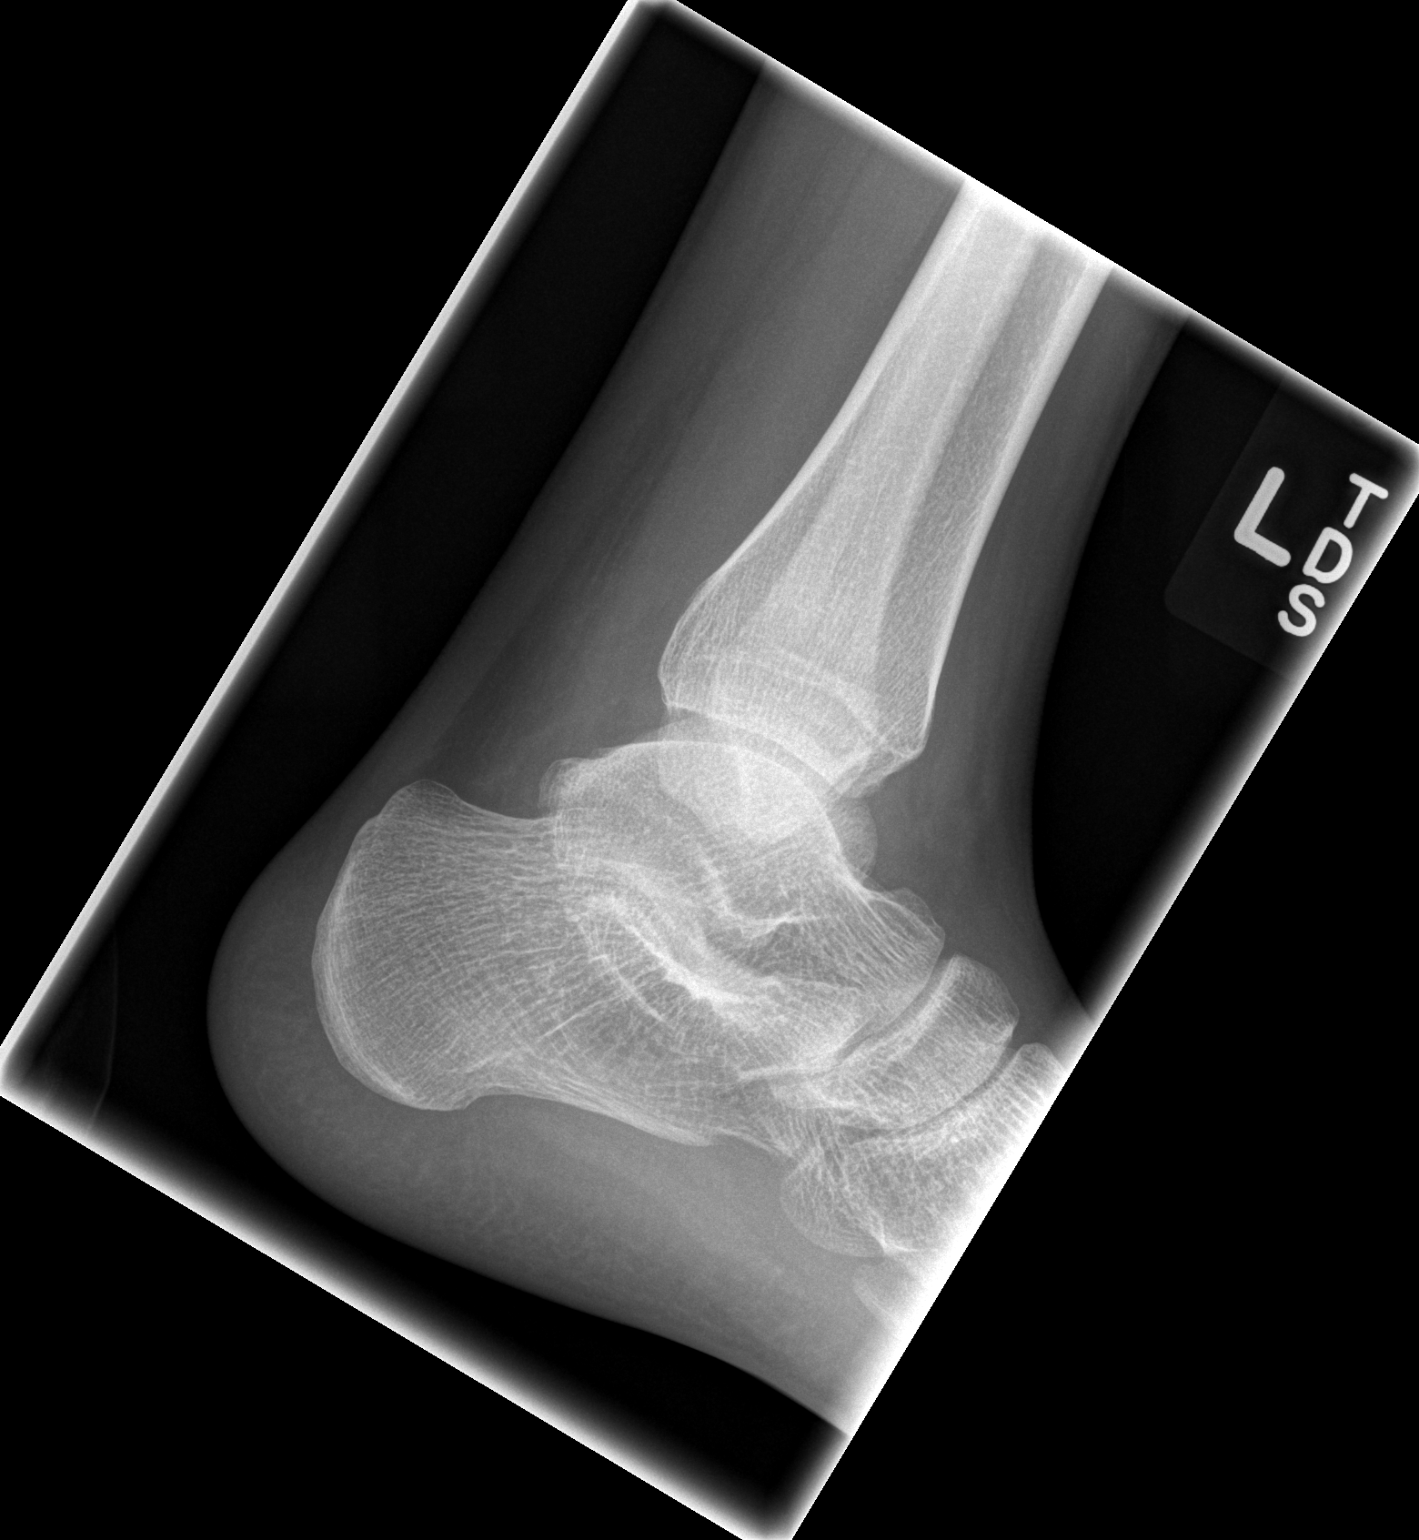

[3 of 3 positions shown; findings below may reference images not displayed]

FINDINGS: Ankle mortise intact.
Osseous mineralization normal.
No acute fracture, dislocation, or bone destruction
IMPRESSION: No acute osseous abnormalities.

## 2014-01-28 ENCOUNTER — Emergency Department (HOSPITAL_COMMUNITY): Payer: Federal, State, Local not specified - PPO

## 2014-01-28 ENCOUNTER — Encounter (HOSPITAL_COMMUNITY): Payer: Self-pay | Admitting: Emergency Medicine

## 2014-01-28 ENCOUNTER — Inpatient Hospital Stay (HOSPITAL_COMMUNITY)
Admission: EM | Admit: 2014-01-28 | Discharge: 2014-01-29 | DRG: 640 | Disposition: A | Discharge status: Home / Self Care | Payer: Federal, State, Local not specified - PPO | Attending: Internal Medicine | Admitting: Internal Medicine

## 2014-01-28 DIAGNOSIS — Z9115 Patient's noncompliance with renal dialysis: Secondary | ICD-10-CM

## 2014-01-28 DIAGNOSIS — R509 Fever, unspecified: Secondary | ICD-10-CM

## 2014-01-28 DIAGNOSIS — J189 Pneumonia, unspecified organism: Secondary | ICD-10-CM

## 2014-01-28 DIAGNOSIS — Z91158 Patient's noncompliance with renal dialysis for other reason: Secondary | ICD-10-CM

## 2014-01-28 DIAGNOSIS — E872 Acidosis, unspecified: Secondary | ICD-10-CM | POA: Diagnosis present

## 2014-01-28 DIAGNOSIS — F1290 Cannabis use, unspecified, uncomplicated: Secondary | ICD-10-CM

## 2014-01-28 DIAGNOSIS — I509 Heart failure, unspecified: Secondary | ICD-10-CM | POA: Diagnosis present

## 2014-01-28 DIAGNOSIS — R109 Unspecified abdominal pain: Secondary | ICD-10-CM

## 2014-01-28 DIAGNOSIS — Z79899 Other long term (current) drug therapy: Secondary | ICD-10-CM

## 2014-01-28 DIAGNOSIS — D72829 Elevated white blood cell count, unspecified: Secondary | ICD-10-CM

## 2014-01-28 DIAGNOSIS — M79672 Pain in left foot: Secondary | ICD-10-CM

## 2014-01-28 DIAGNOSIS — M879 Osteonecrosis, unspecified: Secondary | ICD-10-CM

## 2014-01-28 DIAGNOSIS — Z992 Dependence on renal dialysis: Secondary | ICD-10-CM

## 2014-01-28 DIAGNOSIS — R0902 Hypoxemia: Secondary | ICD-10-CM

## 2014-01-28 DIAGNOSIS — N186 End stage renal disease: Secondary | ICD-10-CM | POA: Diagnosis present

## 2014-01-28 DIAGNOSIS — E875 Hyperkalemia: Principal | ICD-10-CM | POA: Diagnosis present

## 2014-01-28 DIAGNOSIS — E871 Hypo-osmolality and hyponatremia: Secondary | ICD-10-CM

## 2014-01-28 DIAGNOSIS — E213 Hyperparathyroidism, unspecified: Secondary | ICD-10-CM | POA: Diagnosis present

## 2014-01-28 DIAGNOSIS — D571 Sickle-cell disease without crisis: Secondary | ICD-10-CM | POA: Diagnosis present

## 2014-01-28 DIAGNOSIS — Z87891 Personal history of nicotine dependence: Secondary | ICD-10-CM

## 2014-01-28 DIAGNOSIS — Z8673 Personal history of transient ischemic attack (TIA), and cerebral infarction without residual deficits: Secondary | ICD-10-CM

## 2014-01-28 DIAGNOSIS — D649 Anemia, unspecified: Secondary | ICD-10-CM

## 2014-01-28 DIAGNOSIS — R7989 Other specified abnormal findings of blood chemistry: Secondary | ICD-10-CM

## 2014-01-28 DIAGNOSIS — F1210 Cannabis abuse, uncomplicated: Secondary | ICD-10-CM

## 2014-01-28 DIAGNOSIS — D5700 Hb-SS disease with crisis, unspecified: Secondary | ICD-10-CM

## 2014-01-28 DIAGNOSIS — N390 Urinary tract infection, site not specified: Secondary | ICD-10-CM

## 2014-01-28 LAB — CBC WITH DIFFERENTIAL/PLATELET
Basophils Absolute: 0 10*3/uL (ref 0.0–0.1)
Basophils Relative: 0 % (ref 0–1)
EOS PCT: 6 % — AB (ref 0–5)
Eosinophils Absolute: 0.4 10*3/uL (ref 0.0–0.7)
HCT: 15.6 % — ABNORMAL LOW (ref 39.0–52.0)
HEMOGLOBIN: 5.4 g/dL — AB (ref 13.0–17.0)
Lymphocytes Relative: 23 % (ref 12–46)
Lymphs Abs: 1.4 10*3/uL (ref 0.7–4.0)
MCH: 29.7 pg (ref 26.0–34.0)
MCHC: 34.6 g/dL (ref 30.0–36.0)
MCV: 85.7 fL (ref 78.0–100.0)
MONOS PCT: 5 % (ref 3–12)
Monocytes Absolute: 0.3 10*3/uL (ref 0.1–1.0)
NEUTROS PCT: 66 % (ref 43–77)
Neutro Abs: 4.1 10*3/uL (ref 1.7–7.7)
PLATELETS: 272 10*3/uL (ref 150–400)
RBC: 1.82 MIL/uL — AB (ref 4.22–5.81)
RDW: 19.9 % — ABNORMAL HIGH (ref 11.5–15.5)
WBC: 6.2 10*3/uL (ref 4.0–10.5)

## 2014-01-28 LAB — COMPREHENSIVE METABOLIC PANEL
ALBUMIN: 3.7 g/dL (ref 3.5–5.2)
ALT: 20 U/L (ref 0–53)
AST: 41 U/L — ABNORMAL HIGH (ref 0–37)
Alkaline Phosphatase: 101 U/L (ref 39–117)
BUN: 83 mg/dL — ABNORMAL HIGH (ref 6–23)
CO2: 12 mEq/L — ABNORMAL LOW (ref 19–32)
CREATININE: 9.28 mg/dL — AB (ref 0.50–1.35)
Calcium: 9.1 mg/dL (ref 8.4–10.5)
Chloride: 107 mEq/L (ref 96–112)
GFR calc Af Amer: 8 mL/min — ABNORMAL LOW (ref >90)
GFR calc non Af Amer: 7 mL/min — ABNORMAL LOW (ref >90)
Glucose, Bld: 107 mg/dL — ABNORMAL HIGH (ref 70–99)
POTASSIUM: 7.6 meq/L — AB (ref 3.7–5.3)
SODIUM: 135 meq/L — AB (ref 137–147)
Total Bilirubin: 2.3 mg/dL — ABNORMAL HIGH (ref 0.3–1.2)
Total Protein: 8.1 g/dL (ref 6.0–8.3)

## 2014-01-28 LAB — RETICULOCYTES
RBC.: 1.8 MIL/uL — ABNORMAL LOW (ref 4.22–5.81)
RETIC COUNT ABSOLUTE: 70.2 10*3/uL (ref 19.0–186.0)
Retic Ct Pct: 3.9 % — ABNORMAL HIGH (ref 0.4–3.1)

## 2014-01-28 LAB — HEPATITIS B SURFACE ANTIGEN: Hepatitis B Surface Ag: NEGATIVE

## 2014-01-28 LAB — PREPARE RBC (CROSSMATCH)

## 2014-01-28 LAB — POTASSIUM: Potassium: 3 mEq/L — ABNORMAL LOW (ref 3.7–5.3)

## 2014-01-28 MED ORDER — INSULIN ASPART 100 UNIT/ML IV SOLN
10.0000 [IU] | Freq: Once | INTRAVENOUS | Status: DC
  Filled 2014-01-28: qty 0.1

## 2014-01-28 MED ORDER — SODIUM CHLORIDE 0.9 % IV SOLN: 100.0000 mL | INTRAVENOUS | Status: DC | PRN

## 2014-01-28 MED ORDER — INSULIN ASPART 100 UNIT/ML ~~LOC~~ SOLN: 10.0000 [IU] | Freq: Once | SUBCUTANEOUS | Status: DC

## 2014-01-28 MED ORDER — OXYCODONE-ACETAMINOPHEN 5-325 MG PO TABS
1.0000 | ORAL_TABLET | ORAL | Status: DC | PRN
  Administered 2014-01-28 – 2014-01-29 (×3): 1 via ORAL
  Filled 2014-01-28 (×2): qty 1

## 2014-01-28 MED ORDER — CALCIUM GLUCONATE 10 % IV SOLN
1.0000 g | Freq: Once | INTRAVENOUS | Status: DC
  Filled 2014-01-28: qty 10

## 2014-01-28 MED ORDER — ALTEPLASE 2 MG IJ SOLR
2.0000 mg | Freq: Once | INTRAMUSCULAR | Status: AC | PRN
  Filled 2014-01-28: qty 2

## 2014-01-28 MED ORDER — DARBEPOETIN ALFA-POLYSORBATE 150 MCG/0.3ML IJ SOLN
150.0000 ug | INTRAMUSCULAR | Status: DC
  Administered 2014-01-28: 150 ug via INTRAVENOUS
  Filled 2014-01-28: qty 0.3

## 2014-01-28 MED ORDER — HEPARIN SODIUM (PORCINE) 1000 UNIT/ML DIALYSIS
1000.0000 [IU] | INTRAMUSCULAR | Status: DC | PRN
  Filled 2014-01-28: qty 1

## 2014-01-28 MED ORDER — OXYCODONE-ACETAMINOPHEN 5-325 MG PO TABS
ORAL_TABLET | ORAL | Status: AC
  Administered 2014-01-28: 1 via ORAL
  Filled 2014-01-28: qty 1

## 2014-01-28 MED ORDER — LIDOCAINE-PRILOCAINE 2.5-2.5 % EX CREA
1.0000 "application " | TOPICAL_CREAM | CUTANEOUS | Status: DC | PRN
  Filled 2014-01-28: qty 5

## 2014-01-28 MED ORDER — SODIUM CHLORIDE 0.9 % IV SOLN
1.0000 g | Freq: Once | INTRAVENOUS | Status: DC
  Filled 2014-01-28 (×2): qty 10

## 2014-01-28 MED ORDER — DEXTROSE 50 % IV SOLN
1.0000 | Freq: Once | INTRAVENOUS | Status: DC
  Filled 2014-01-28: qty 50

## 2014-01-28 MED ORDER — RENA-VITE PO TABS
1.0000 | ORAL_TABLET | Freq: Every day | ORAL | Status: DC
  Administered 2014-01-28: 1 via ORAL
  Filled 2014-01-28: qty 1

## 2014-01-28 MED ORDER — PENTAFLUOROPROP-TETRAFLUOROETH EX AERO
1.0000 "application " | INHALATION_SPRAY | CUTANEOUS | Status: DC | PRN
  Filled 2014-01-28: qty 103.5

## 2014-01-28 MED ORDER — CINACALCET HCL 30 MG PO TABS
30.0000 mg | ORAL_TABLET | Freq: Every day | ORAL | Status: DC
  Administered 2014-01-29: 30 mg via ORAL
  Filled 2014-01-28 (×2): qty 1

## 2014-01-28 MED ORDER — LIDOCAINE HCL (PF) 1 % IJ SOLN: 5.0000 mL | INTRAMUSCULAR | Status: DC | PRN

## 2014-01-28 MED ORDER — FOLIC ACID 1 MG PO TABS
1.0000 mg | ORAL_TABLET | Freq: Every day | ORAL | Status: DC
  Administered 2014-01-29: 1 mg via ORAL
  Filled 2014-01-28: qty 1

## 2014-01-28 MED ORDER — DARBEPOETIN ALFA-POLYSORBATE 150 MCG/0.3ML IJ SOLN
INTRAMUSCULAR | Status: AC
  Filled 2014-01-28: qty 0.3

## 2014-01-28 MED ORDER — FOLIC ACID 1 MG PO TABS: 1.0000 mg | ORAL_TABLET | Freq: Every day | ORAL | Status: DC

## 2014-01-28 MED ORDER — SODIUM BICARBONATE 8.4 % IV SOLN
50.0000 meq | Freq: Once | INTRAVENOUS | Status: DC
  Filled 2014-01-28: qty 50

## 2014-01-28 MED ORDER — SODIUM CHLORIDE 0.9 % IJ SOLN
3.0000 mL | Freq: Two times a day (BID) | INTRAMUSCULAR | Status: DC
  Administered 2014-01-29 (×2): 3 mL via INTRAVENOUS

## 2014-01-28 MED ORDER — SEVELAMER CARBONATE 800 MG PO TABS
800.0000 mg | ORAL_TABLET | Freq: Three times a day (TID) | ORAL | Status: DC
  Administered 2014-01-29 (×2): 800 mg via ORAL
  Filled 2014-01-28 (×4): qty 1

## 2014-01-28 MED ORDER — SODIUM CHLORIDE 0.9 % IV SOLN
Freq: Once | INTRAVENOUS | Status: AC
  Administered 2014-01-28: 1000 mL via INTRAVENOUS

## 2014-01-28 MED ORDER — NEPRO/CARBSTEADY PO LIQD
237.0000 mL | ORAL | Status: DC | PRN
  Filled 2014-01-28: qty 237

## 2014-01-28 NOTE — ED Notes (Signed)
Pt here for blood transfusion, gets them all the time, states missed dialysis last sat and this past tuesday and was called yesterday and told not to come on thurdsay but to come to er for  Blood due to hgb being really low  Unsure what # is

## 2014-01-28 NOTE — H&P (Signed)
[facility] History and Physical  Philip Saanthony Migliaccio [id number] DOB: 04-21-1986 DOA: 01/28/2014  Referring physician: Emergency Department PCP: Dorrene GermanAVBUERE,EDWIN A, MD  Specialists:   Chief Complaint: Hyperkalemia  HPI: Philip Rios is a 28 y.o. male  With a hx of ESRD normally on TTS HD and sickle cell disease who is referred to the ED for an abnormally high potassium and low hemoglobin. Pt had admitted to not doing HD since 4/18 secondary to "life." On further questioning, pt admits to smoking marijuana on a daily basis. Nephrology consulted. For urgent HD. Hospitalist consulted for admission.  Review of Systems:  Per above, the remainder of the 10pt ros reviewed and are neg  Past Medical History  Diagnosis Date  . Sickle cell disease, type SS     frequent pain crises + hx of stroke  . Pica   . Anemia   . Hyperparathyroidism   . Polysubstance abuse   . CHF (congestive heart failure)   . Transfusion associated hemochromatosis   . Asthma     "grew out of it" (07/10/2013)  . Shortness of breath     "when I come in w/pain crisis" (07/10/2013)  . 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 Renal disorder     end stage renal failure  . ESRD (end stage renal disease) on dialysis 03/07/2013      most likely from sickle cell disease; [facility] [place]. renal doctor's are from [facility].  He has a LUA AVF which was first used early June 2014; weight 136 lbs as of June 2014.  (07/10/2013)  . Pneumonia     "today makes the 3-4th time" (07/10/2013)   Past Surgical History  Procedure Laterality Date  . Renal biopsy  2013  . Av fistula placement Left 2014    "upper arm" (07/10/2013)  . Cholecystectomy  ?2008  . Insertion of dialysis catheter Right 03/2012    "chest" (07/10/2013)  . Removal of a dialysis catheter Right 11/2012    "chest" (07/10/2013)  . Wisdom tooth extraction  ~ 2007    "all 4" (07/10/2013)   Social History:   reports that he quit smoking about 7 months ago. His smoking use included Cigarettes. He has a .6 pack-year smoking history. He has never used smokeless tobacco. He reports that he uses illicit drugs (Marijuana) about 7 times per week. He reports that he does not drink alcohol.  where does patient live--home, ALF, SNF? and with whom if at home?  Can patient participate in ADLs?  Allergies  Allergen Reactions  . Codeine Hives    Family History  Problem Relation Age of Onset  . Diabetes Sister   . Other Mother   . Other Father     (be sure to complete)  Prior to Admission medications   Medication Sig Start Date End Date Taking? Authorizing Provider  cinacalcet (SENSIPAR) 30 MG tablet Take 30 mg by mouth daily. 08/20/13  Yes Historical Provider, MD  darbepoetin (ARANESP) 150 MCG/0.3ML SOLN injection Inject 150 mcg into the vein every Friday with hemodialysis. Once weekly. Pt thinjks maybe on Tuesday 08/28/13  Yes Joseph ArtJessica U Vann, DO  folic acid (FOLVITE) 1 MG tablet Take 1 tablet (1 mg total) by mouth daily. 08/26/13  Yes Joseph ArtJessica U Vann, DO  multivitamin (RENA-VIT) TABS tablet Take 1 tablet by mouth at bedtime. 08/26/13  Yes Joseph ArtJessica U Vann, DO  oxyCODONE-acetaminophen (PERCOCET) 5-325 MG per tablet Take 1  tablet by mouth every 4 (four) hours as needed. 08/26/13  Yes Joseph ArtJessica U Vann, DO  sevelamer carbonate (RENVELA) 800 MG tablet Take 800 mg by mouth 3 (three) times daily with meals.   Yes Historical Provider, MD   Physical Exam: Filed Vitals:   01/28/14 1605 01/28/14 1615 01/28/14 1630 01/28/14 1645  BP: 155/82 163/82 152/94 144/89  Pulse: 66 65 67 67  Temp:      TempSrc:      Resp: 15 15 14 21   SpO2: 100% 100% 100% 100%     General:  Awake, in nad  Eyes: PERRL B  ENT: membranes moist, dentition fair  Neck: trachea midline, neck supple  Cardiovascular: regular, s1, s2  Respiratory: normal resp effort, no wheezing  Abdomen: soft,nondistended  Skin: normal skin turgor, no  abnormal skin lesions seen  Musculoskeletal: perfused, no clubbing  Psychiatric: mood/affect normal// no auditory/visual hallucinations  Neurologic: cn2-12 intact, strength/sensation intact  Labs on Admission:  Basic Metabolic Panel:  Recent Labs Lab 01/28/14 1439  NA 135*  K 7.6*  CL 107  CO2 12*  GLUCOSE 107*  BUN 83*  CREATININE 9.28*  CALCIUM 9.1   Liver Function Tests:  Recent Labs Lab 01/28/14 1439  AST 41*  ALT 20  ALKPHOS 101  BILITOT 2.3*  PROT 8.1  ALBUMIN 3.7   No results found for this basename: LIPASE, AMYLASE,  in the last 168 hours No results found for this basename: AMMONIA,  in the last 168 hours CBC:  Recent Labs Lab 01/28/14 1439  WBC 6.2  NEUTROABS 4.1  HGB 5.4*  HCT 15.6*  MCV 85.7  PLT 272   Cardiac Enzymes: No results found for this basename: CKTOTAL, CKMB, CKMBINDEX, TROPONINI,  in the last 168 hours  BNP (last 3 results) No results found for this basename: PROBNP,  in the last 8760 hours CBG: No results found for this basename: GLUCAP,  in the last 168 hours  Radiological Exams on Admission: Dg Chest Port 1 View  01/28/2014   CLINICAL DATA:  Dialysis, sickle cell disease  EXAM: PORTABLE CHEST - 1 VIEW  COMPARISON:  Portable exam 1633 hr compared to 08/22/2013  FINDINGS: Enlargement of cardiac silhouette.  Mediastinal contours and pulmonary vascularity normal.  Lungs clear.  No pleural effusion or pneumothorax.  No acute osseous findings.  IMPRESSION: Mild enlargement of cardiac silhouette.  No acute abnormalities.   Electronically Signed   By: Ulyses SouthwardMark  Boles M.D.   On: 01/28/2014 16:53    EKG: Independently reviewed. Peaked T waves  Assessment/Plan Principal Problem:   Hyperkalemia Active Problems:   Sickle cell disease   ESRD on dialysis   Anemia   1. Hyperkalemia 1. Secondary to noncompliance with HD 2. Nephrology consulted 3. For urgent HD this afternoon 4. Admit to med-tele 2. Sickle cell  disease 1. Anemic 2. Asymptomatic 3. Last retic count of over 8% in 12/14 4. Would hold off transfusion unless retic count is low 5. Continue folic acid 6. Follow cbc 3. ESRD 1. For urgent HD today 2. Nephrology following 4. DVT prophylaxis 1. SCD's 5. Polysubstance abuse 1. Advised to avoid marijuana  Code Status: Full (must indicate code status--if unknown or must be presumed, indicate so) Family Communication: Pt in room (indicate person spoken with, if applicable, with phone number if by telephone) Disposition Plan: Pending (indicate anticipated LOS)  Time spent: 40min  Jerald KiefStephen K Markesha Hannig [facility] Pager [phone]  If 7PM-7AM, please contact night-coverage [email or web address] Password [email or web address] 01/28/2014, 5:05 PM

## 2014-01-28 NOTE — Consult Note (Signed)
Philip Rios 01/28/2014 Philip Rios [place] Rios Requesting Physician:  Elpidio AnisShari Upstill  Reason for Consult:  Hyperkalemia, ESRD HPI:  35M ESRD (LUA AVF, [facility], ?2/2 sickle nephropathy) presents after outpt HD unit directed to ED since hasn't attended Tx sincer 01/16/14.  Pt states this is because he doesn't feel good after treatments.  When asked why he doesn't discuss this w/ his doctors, he doesn't have a rational answer.  His K was 7.6 and rec medical therapy.    He has no SOB, N/V, LEE, itching, dysgeusia, CP.  He has sickle cell disease rec Aranesp at HD center but none recenlty.  Hb in 5s and ED has written to transfuse pt.  Outpt records not immediatlely available.  States 3h4245min.    EKG with peaked Ts   Creatinine, Ser (mg/dL)  Date Value  1/61/09604/30/2015 9.28*  09/04/2013 4.18*  08/26/2013 3.59*  08/26/2013 7.84*  08/25/2013 6.91*  08/24/2013 5.26*  08/23/2013 7.72*  08/22/2013 7.46*  08/22/2013 6.47*  07/11/2013 5.92*  ] I/Os:  ROS Balance of 12 systems is negative w/ exceptions as above  PMH  Past Medical History  Diagnosis Date  . Sickle cell disease, type SS     frequent pain crises + hx of stroke  . Pica   . Anemia   . Hyperparathyroidism   . Polysubstance abuse   . CHF (congestive heart failure)   . Transfusion associated hemochromatosis   . Asthma     "grew out of it" (07/10/2013)  . Shortness of breath     "when I come in w/pain crisis" (07/10/2013)  . 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 Renal disorder     end stage renal failure  . ESRD (end stage renal disease) on dialysis 03/07/2013      most likely from sickle cell disease; dial[facility] [place] [place]. renal doctor's are from [facility].  He has a LUA AVF which was first used early June 2014; weight 136 lbs as of June 2014.  (07/10/2013)  . Pneumonia     "today makes the 3-4th time" (07/10/2013)   PSH  Past Surgical History  Procedure Laterality Date   . Renal biopsy  2013  . Av fistula placement Left 2014    "upper arm" (07/10/2013)  . Cholecystectomy  ?2008  . Insertion of dialysis catheter Right 03/2012    "chest" (07/10/2013)  . Removal of a dialysis catheter Right 11/2012    "chest" (07/10/2013)  . Wisdom tooth extraction  ~ 2007    "all 4" (07/10/2013)   FH  Family History  Problem Relation Age of Onset  . Diabetes Sister   . Other Mother   . Other Father    SH  reports that he quit smoking about 7 months ago. His smoking use included Cigarettes. He has a .6 pack-year smoking history. He has never used smokeless tobacco. He reports that he uses illicit drugs (Marijuana) about 7 times per week. He reports that he does not drink alcohol. Allergies  Allergies  Allergen Reactions  . Codeine Hives   Home medications Prior to Admission medications   Medication Sig Start Date End Date Taking? Authorizing Provider  cinacalcet (SENSIPAR) 30 MG tablet Take 30 mg by mouth daily. 08/20/13  Yes Historical Provider, MD  darbepoetin (ARANESP) 150 MCG/0.3ML SOLN injection Inject 150 mcg into the vein every Friday with hemodialysis. Once weekly. Pt thinjks maybe on Tuesday 08/28/13  Yes Selinda OrionJessica U  Vann, DO  folic acid (FOLVITE) 1 MG tablet Take 1 tablet (1 mg total) by mouth daily. 08/26/13  Yes Joseph ArtJessica U Vann, DO  multivitamin (RENA-VIT) TABS tablet Take 1 tablet by mouth at bedtime. 08/26/13  Yes Joseph ArtJessica U Vann, DO  oxyCODONE-acetaminophen (PERCOCET) 5-325 MG per tablet Take 1 tablet by mouth every 4 (four) hours as needed. 08/26/13  Yes Joseph ArtJessica U Vann, DO  sevelamer carbonate (RENVELA) 800 MG tablet Take 800 mg by mouth 3 (three) times daily with meals.   Yes Historical Provider, MD    Current Medications Current Facility-Administered Medications  Medication Dose Route Frequency Provider Last Rate Last Dose  . calcium gluconate 1 g in sodium chloride 0.9 % 100 mL IVPB  1 g Intravenous Once Shari A Upstill, PA-C      . dextrose 50 %  solution 50 mL  1 ampule Intravenous Once Shari A Upstill, PA-C      . insulin aspart (novoLOG) injection 10 Units  10 Units Intravenous Once Shari A Upstill, PA-C      . sodium bicarbonate injection 50 mEq  50 mEq Intravenous Once Arnoldo HookerShari A Upstill, PA-C       Current Outpatient Prescriptions  Medication Sig Dispense Refill  . cinacalcet (SENSIPAR) 30 MG tablet Take 30 mg by mouth daily.      . darbepoetin (ARANESP) 150 MCG/0.3ML SOLN injection Inject 150 mcg into the vein every Friday with hemodialysis. Once weekly. Pt thinjks maybe on Tuesday      . folic acid (FOLVITE) 1 MG tablet Take 1 tablet (1 mg total) by mouth daily.  30 tablet  0  . multivitamin (RENA-VIT) TABS tablet Take 1 tablet by mouth at bedtime.    0  . oxyCODONE-acetaminophen (PERCOCET) 5-325 MG per tablet Take 1 tablet by mouth every 4 (four) hours as needed.  25 tablet  0  . sevelamer carbonate (RENVELA) 800 MG tablet Take 800 mg by mouth 3 (three) times daily with meals.        CBC  Recent Labs Lab 01/28/14 1439  WBC 6.2  NEUTROABS 4.1  HGB 5.4*  HCT 15.6*  MCV 85.7  PLT 272   Basic Metabolic Panel  Recent Labs Lab 01/28/14 1439  NA 135*  K 7.6*  CL 107  CO2 12*  GLUCOSE 107*  BUN 83*  CREATININE 9.28*  CALCIUM 9.1    Physical Exam  Blood pressure 147/87, pulse 67, temperature 98.2 F (36.8 C), temperature source Oral, resp. rate 12, SpO2 100.00%. GEN: NAD ENT: NCAT, fair dentition EYES: mild scleral icterus CV: RRR, no rub. Nl s1s2 PULM: CTAB ABD: s/nt/nd nabs SKIN: no rashes/lesions EXT:no LEE VASC: LUA AVF +Rios/T, nlo appearance   Assessment 27M ESRD THS LUA AVF [facility] with hyperkalemia, missed dialysis treatments, and some worsening of chronic anemia  1. ESRD THS LUA AVF, No Tx since 4/18 2. Hyperkalemia 3. Anemia, sickle cell disease 4. Metabolic Acidosis  PLAN 1. Will provide HD this evening, 2K bath 2. Can transfused ordered pRBC with treatment 3. Will give aranesp  today 4. Check K 2h into treatment 5. Discussed w/ pt that missing dialysis is not a "statement" to his outpt unit and that he should work through his concerns with the rounding providers. Continuation of this will preclude his candidacy for a transplant.   Sabra Heckyan Jaquan Sadowsky MD 01/28/2014, 4:50 PM

## 2014-01-28 NOTE — ED Notes (Signed)
Sherry RN from Dialysis states DO NOT GIVE IV medications at this time. Dialysis ready for patient.

## 2014-01-28 NOTE — ED Provider Notes (Signed)
CSN: [id number]     Arrival date & time 01/28/14  1410 History   First MD Initiated Contact with Patient 01/28/14 1541     Chief Complaint  Patient presents with  . Abnormal Lab     (Consider location/radiation/quality/duration/timing/severity/associated sxs/prior Treatment) HPI Comments: He presents with concern for low blood hemoglobin found on outpatient study. He has a history of anemia requiring transfusion, last 11/2013, sickle Cell Disease type SS, and ESRD-HD secondary to his Sickle Cell Disease. He usually dialyzes T, Th, S, last dialyzed on 01/16/2014. He denies any current symptoms, only of being cold. No pain, syncope or near syncope, fever.   The history is provided by the patient. No language interpreter was used.    Past Medical History  Diagnosis Date  . Sickle cell disease, type SS     frequent pain crises + hx of stroke  . Pica   . Anemia   . Hyperparathyroidism   . Polysubstance abuse   . CHF (congestive heart failure)   . Transfusion associated hemochromatosis   . Asthma     "grew out of it" (07/10/2013)  . Shortness of breath     "when I come in w/pain crisis" (07/10/2013)  . 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 Renal disorder     end stage renal failure  . ESRD (end stage renal disease) on dialysis 03/07/2013      most likely from sickle cell disease; dialyzed at [facility] in [facility] [place]. renal doctor's are from [facility].  He has a LUA AVF which was first used early June 2014; weight 136 lbs as of June 2014.  (07/10/2013)  . Pneumonia     "today makes the 3-4th time" (07/10/2013)   Past Surgical History  Procedure Laterality Date  . Renal biopsy  2013  . Av fistula placement Left 2014    "upper arm" (07/10/2013)  . Cholecystectomy  ?2008  . Insertion of dialysis catheter Right 03/2012    "chest" (07/10/2013)  . Removal of a dialysis catheter Right 11/2012    "chest" (07/10/2013)  . Wisdom tooth extraction  ~  2007    "all 4" (07/10/2013)   Family History  Problem Relation Age of Onset  . Diabetes Sister   . Other Mother   . Other Father    History  Substance Use Topics  . Smoking status: Former Smoker -- 0.12 packs/day for 5 years    Types: Cigarettes    Quit date: 06/04/2013  . Smokeless tobacco: Never Used  . Alcohol Use: No     Comment: 07/10/2013 "stopped alcohol ~ 1 month ago; maybe 3-5 shots, once or twice/wk"    Review of Systems  Constitutional: Negative for fever.  Eyes: Negative for visual disturbance.  Respiratory: Negative for shortness of breath.   Cardiovascular: Negative for chest pain.  Gastrointestinal: Negative for nausea, vomiting and abdominal pain.  Musculoskeletal: Negative for myalgias.  Skin: Negative for pallor.  Neurological: Negative for dizziness, syncope, weakness and light-headedness.  Psychiatric/Behavioral: Negative for confusion.      Allergies  Codeine  Home Medications   Prior to Admission medications   Medication Sig Start Date End Date Taking? Authorizing Provider  cinacalcet (SENSIPAR) 30 MG tablet Take 30 mg by mouth daily. 08/20/13  Yes Historical Provider, MD  darbepoetin (ARANESP) 150 MCG/0.3ML SOLN injection Inject 150 mcg into the vein every Friday with hemodialysis. Once weekly. Pt thinjks maybe on Tuesday 08/28/13  Yes  Joseph ArtJessica U Vann, DO  folic acid (FOLVITE) 1 MG tablet Take 1 tablet (1 mg total) by mouth daily. 08/26/13  Yes Joseph ArtJessica U Vann, DO  multivitamin (RENA-VIT) TABS tablet Take 1 tablet by mouth at bedtime. 08/26/13  Yes Joseph ArtJessica U Vann, DO  oxyCODONE-acetaminophen (PERCOCET) 5-325 MG per tablet Take 1 tablet by mouth every 4 (four) hours as needed. 08/26/13  Yes Joseph ArtJessica U Vann, DO  sevelamer carbonate (RENVELA) 800 MG tablet Take 800 mg by mouth 3 (three) times daily with meals.   Yes Historical Provider, MD   BP 147/87  Pulse 67  Temp(Src) 98.2 F (36.8 C) (Oral)  Resp 12  SpO2 100% Physical Exam  Constitutional:  He is oriented to person, place, and time. He appears well-developed and well-nourished.  HENT:  Head: Normocephalic.  Eyes: Scleral icterus is present.  Conjunctival pallor.  Neck: Normal range of motion. Neck supple.  Cardiovascular: Normal rate and regular rhythm.   Pulmonary/Chest: Effort normal and breath sounds normal.  Abdominal: Soft. Bowel sounds are normal. There is no tenderness. There is no rebound and no guarding.  Musculoskeletal: Normal range of motion. He exhibits no edema.  Neurological: He is alert and oriented to person, place, and time.  Skin: Skin is warm and dry. No rash noted.  Psychiatric: He has a normal mood and affect.    ED Course  Procedures (including critical care time) Labs Review Labs Reviewed  CBC WITH DIFFERENTIAL - Abnormal; Notable for the following:    RBC 1.82 (*)    Hemoglobin 5.4 (*)    HCT 15.6 (*)    RDW 19.9 (*)    Eosinophils Relative 6 (*)    All other components within normal limits  COMPREHENSIVE METABOLIC PANEL - Abnormal; Notable for the following:    Sodium 135 (*)    Potassium 7.6 (*)    CO2 12 (*)    Glucose, Bld 107 (*)    BUN 83 (*)    Creatinine, Ser 9.28 (*)    AST 41 (*)    Total Bilirubin 2.3 (*)    GFR calc non Af Amer 7 (*)    GFR calc Af Amer 8 (*)    All other components within normal limits  TYPE AND SCREEN  PREPARE RBC (CROSSMATCH)    Imaging Review No results found.   EKG Interpretation   Date/Time:  Thursday January 28 2014 16:01:32 EDT Ventricular Rate:  68 PR Interval:  194 QRS Duration: 87 QT Interval:  421 QTC Calculation: 448 R Axis:   70 Text Interpretation:  Sinus rhythm Probable left ventricular hypertrophy  Abnrm T, consider ischemia, anterolateral lds Peaked T waves c/w  hyperkalemia, are new Since last tracing Confirmed by [facility] Center[facility]ENTZ  MD, ELLIOTT  [phone]([id number]) on 01/28/2014 4:11:07 PM     CRITICAL CARE Performed by: Arnoldo HookerShari A Diallo Ponder   Total critical care time: 20  Critical care time was  exclusive of separately billable procedures and treating other patients.  Critical care was necessary to treat or prevent imminent or life-threatening deterioration.  Critical care was time spent personally by me on the following activities: development of treatment plan with patient and/or surrogate as well as nursing, discussions with consultants, evaluation of patient's response to treatment, examination of patient, obtaining history from patient or surrogate, ordering and performing treatments and interventions, ordering and review of laboratory studies, ordering and review of radiographic studies, pulse oximetry and re-evaluation of patient's condition.  MDM   Final diagnoses:  None  1. Hyperkalemia 2. Anemia requiring transfusion 3. Medical noncomliance   Potassium immediately treated with calcium, insulin and bicarb. IV NS established. EKG c/w hyperkalemia. The patient is comfortable, stable, asymptomatic.   Discussed consultation with nephrology, Dr. Marisue HumbleSanford, who will arrange for emergent dialysis. [facility] paged for admission. Dr. Rhona Leavenshiu accepting. Blood transfusion postponed pending his direct evaluation. Admit to telemetry.  Arnoldo HookerShari A Kaede Clendenen, PA-C 01/28/14 1648

## 2014-01-28 NOTE — ED Notes (Signed)
Pt transported to Hemodialysis by this RN

## 2014-01-28 NOTE — ED Notes (Signed)
Critical lab received from Lab:  Potassium 7.6   Dr. Manus Gunningancour notified, read back and acknowledged Andreas OhmEmily Gentile, RN notified, read back and acknowledged

## 2014-01-28 NOTE — ED Notes (Addendum)
Contacted pharmacy x2 to request calcium gluconate. Received verbal order from LadoraShari PA to give Calcium Gluconate prior to administering Sodium Bicarb, Dextrose, Insulin, and to hold Blood transfusion until Admitting MD sees patient.

## 2014-01-28 NOTE — ED Notes (Signed)
Admitting MD at bedside.

## 2014-01-28 NOTE — ED Provider Notes (Signed)
  Face-to-face evaluation   History: He is here because his hemoglobin is low. He was told that is based on lab drawn, and 01/16/14.  He denies fever, chills, nausea, vomiting, weakness, or dizziness    Physical exam: Sclerae are icteric. Heart regular rate and rhythm, no murmur. Lungs clear to auscultation. Fistula left upper arm appears normal.  Medical screening examination/treatment/procedure(s) were conducted as a shared visit with non-physician practitioner(s) and myself.  I personally evaluated the patient during the encounter  Flint MelterElliott L Lesean Woolverton, MD 01/29/14 [phone]

## 2014-01-28 NOTE — Progress Notes (Signed)
Called by primary RN to see pt with c/o HA & numb tongue.  Per HD RN pt developed HA 45 min prior to completing HD & stated shortly after that his tongue was numb.  Pt states he occasionally gets HA during HD but improves with wash back.  On assessment pt is neuro intact with c/o frontal HA 8/10 & numbness of his tongue on both sides, able to move tongue with no weakness.  Pt requesting pain meds.  Pt appears in no acte distress.  Discussed care with primary RN.  Will cont. To monitor as needed

## 2014-01-28 NOTE — ED Notes (Signed)
Critical result received.   Potassium: 7.6

## 2014-01-29 ENCOUNTER — Encounter (HOSPITAL_COMMUNITY): Payer: Self-pay | Admitting: *Deleted

## 2014-01-29 ENCOUNTER — Telehealth: Payer: Self-pay | Admitting: Pediatrics

## 2014-01-29 LAB — HEMOGLOBIN AND HEMATOCRIT, BLOOD
HEMATOCRIT: 15.1 % — AB (ref 39.0–52.0)
Hemoglobin: 5.4 g/dL — CL (ref 13.0–17.0)

## 2014-01-29 MED ORDER — MORPHINE SULFATE 2 MG/ML IJ SOLN
1.0000 mg | Freq: Once | INTRAMUSCULAR | Status: AC
  Administered 2014-01-29: 1 mg via INTRAVENOUS
  Filled 2014-01-29: qty 1

## 2014-01-29 NOTE — Progress Notes (Signed)
[facility] ROUNDING NOTE   Subjective:   Interval History: no complaints this morning  Objective:  Vital signs in last 24 hours:  Temp:  [97.7 F (36.5 C)-98.8 F (37.1 C)] 97.7 F (36.5 C) (05/01 0502) Pulse Rate:  [65-87] 72 (05/01 0502) Resp:  [12-21] 17 (05/01 0502) BP: (129-167)/(71-95) 130/86 mmHg (05/01 0502) SpO2:  [99 %-100 %] 100 % (05/01 0502) Weight:  [58.695 kg (129 lb 6.4 oz)-60.1 kg (132 lb 7.9 oz)] 58.695 kg (129 lb 6.4 oz) (04/30 2115)  Weight change:  Filed Weights   01/28/14 1713 01/28/14 2115  Weight: 60.1 kg (132 lb 7.9 oz) 58.695 kg (129 lb 6.4 oz)    Intake/Output: I/O last 3 completed shifts: In: 120 [P.O.:120] Out: -200    Intake/Output this shift:  Total I/O In: 240 [P.O.:240] Out: -   CVS- RRR RS- CTA ABD- BS present soft non-distended EXT- no edema   Basic Metabolic Panel:  Recent Labs Lab 01/28/14 1439 01/28/14 2040  NA 135*  --   K 7.6* 3.0*  CL 107  --   CO2 12*  --   GLUCOSE 107*  --   BUN 83*  --   CREATININE 9.28*  --   CALCIUM 9.1  --     Liver Function Tests:  Recent Labs Lab 01/28/14 1439  AST 41*  ALT 20  ALKPHOS 101  BILITOT 2.3*  PROT 8.1  ALBUMIN 3.7   No results found for this basename: LIPASE, AMYLASE,  in the last 168 hours No results found for this basename: AMMONIA,  in the last 168 hours  CBC:  Recent Labs Lab 01/28/14 1439 01/29/14 0918  WBC 6.2  --   NEUTROABS 4.1  --   HGB 5.4* 5.4*  HCT 15.6* 15.1*  MCV 85.7  --   PLT 272  --     Cardiac Enzymes: No results found for this basename: CKTOTAL, CKMB, CKMBINDEX, TROPONINI,  in the last 168 hours  BNP: No components found with this basename: POCBNP,   CBG: No results found for this basename: GLUCAP,  in the last 168 hours  Microbiology: Results for orders placed during the hospital encounter of 08/22/13  CULTURE, BLOOD (ROUTINE X 2)     Status: None   Collection Time    08/22/13  9:25 PM      Result Value  Ref Range Status   Specimen Description BLOOD LEFT ARM   Final   Special Requests BOTTLES DRAWN AEROBIC AND ANAEROBIC 10CC EA   Final   Culture  Setup Time     Final   Value: 08/23/2013 00:48     Performed at [facility]   Culture     Final   Value: NO GROWTH 5 DAYS     Performed at [facility]   Report Status 08/29/2013 FINAL   Final  CULTURE, BLOOD (ROUTINE X 2)     Status: None   Collection Time    08/22/13  9:30 PM      Result Value Ref Range Status   Specimen Description BLOOD LEFT HAND   Final   Special Requests BOTTLES DRAWN AEROBIC ONLY 10CC   Final   Culture  Setup Time     Final   Value: 08/23/2013 00:48     Performed at [facility]   Culture     Final   Value: NO GROWTH 5 DAYS     Performed at [facility]   Report Status  08/29/2013 FINAL   Final  URINE CULTURE     Status: None   Collection Time    08/23/13 12:40 AM      Result Value Ref Range Status   Specimen Description URINE, CLEAN CATCH   Final   Special Requests NONE   Final   Culture  Setup Time     Final   Value: 08/23/2013 02:01     Performed at [facility]   Colony Count     Final   Value: NO GROWTH     Performed at [facility]   Culture     Final   Value: NO GROWTH     Performed at [facility]   Report Status 08/24/2013 FINAL   Final  MRSA PCR SCREENING     Status: None   Collection Time    08/23/13 12:40 AM      Result Value Ref Range Status   MRSA by PCR NEGATIVE  NEGATIVE Final   Comment:            The GeneXpert MRSA Assay (FDA     approved for NASAL specimens     only), is one component of a     comprehensive MRSA colonization     surveillance program. It is not     intended to diagnose MRSA     infection nor to guide or     monitor treatment for     MRSA infections.    Coagulation Studies: No results found for this basename: LABPROT, INR,  in the last 72 hours  Urinalysis: No results found for this basename:  COLORURINE, APPERANCEUR, LABSPEC, PHURINE, GLUCOSEU, HGBUR, BILIRUBINUR, KETONESUR, PROTEINUR, UROBILINOGEN, NITRITE, LEUKOCYTESUR,  in the last 72 hours    Imaging: Dg Chest Port 1 View  01/28/2014   CLINICAL DATA:  Dialysis, sickle cell disease  EXAM: PORTABLE CHEST - 1 VIEW  COMPARISON:  Portable exam 1633 hr compared to 08/22/2013  FINDINGS: Enlargement of cardiac silhouette.  Mediastinal contours and pulmonary vascularity normal.  Lungs clear.  No pleural effusion or pneumothorax.  No acute osseous findings.  IMPRESSION: Mild enlargement of cardiac silhouette.  No acute abnormalities.   Electronically Signed   By: Ulyses SouthwardMark  Boles M.D.   On: 01/28/2014 16:53     Medications:     . calcium gluconate 1 GM IV  1 g Intravenous Once  . cinacalcet  30 mg Oral Q breakfast  . darbepoetin (ARANESP) injection - DIALYSIS  150 mcg Intravenous Q Thu-HD  . dextrose  1 ampule Intravenous Once  . folic acid  1 mg Oral Daily  . insulin aspart  10 Units Intravenous Once  . multivitamin  1 tablet Oral QHS  . sevelamer carbonate  800 mg Oral TID WC  . sodium bicarbonate  50 mEq Intravenous Once  . sodium chloride  3 mL Intravenous Q12H   sodium chloride, sodium chloride, feeding supplement (NEPRO CARB STEADY), heparin, lidocaine (PF), lidocaine-prilocaine, oxyCODONE-acetaminophen, pentafluoroprop-tetrafluoroeth  Assessment/ Plan:  66M ESRD (LUA AVF, [facility], ?2/2 sickle nephropathy) presents after outpt HD unit directed to ED since hasn't attended Tx sincer 01/16/14. Pt states this is because he doesn't feel good after treatments   ESRD  Dialysis treatments through [facility]   No issues from renal point of view and may discharge back to outpatient unit for dialysis tomorrow   No volume issues and may need dry weight adjusted   LOS: 1 Garnetta BuddyMartin W Jakeya Gherardi @TODAY @11 :15 AM

## 2014-01-29 NOTE — Progress Notes (Signed)
New Admission:  Arrival Method: stretcher Mental Orientation: AOX4 Telemetry: placed Assessment: completed Skin:intact IV: NSL Pain:addressed Tubes:na Safety Measures:initiated Admission:completed [address] [place] Orientation:completed Family:nz  Orders have been reviewed and implemented. Will continue to monitor the patient. Call light has been placed within reach and bed alarm has been activated.  [employer]incoln National CorporationDenise Jonavin Seder BSN, RN-BC [phone]

## 2014-01-29 NOTE — Discharge Summary (Signed)
Physician Discharge Summary  Philip Rios ZOX:[id number] DOB: [birth year] DOA: 01/28/2014  PCP: Dorrene GermanAVBUERE,EDWIN A, MD  Admit date: 01/28/2014 Discharge date: 01/29/2014  Time spent: >35 minutes  Recommendations for Outpatient Follow-up:  F/u with HD as scheduled F/u with PCP in 1 week  Discharge Diagnoses:  Principal Problem:   Hyperkalemia Active Problems:   Sickle cell disease   ESRD on dialysis   Anemia   Discharge Condition: stable   Diet recommendation: renal   Filed Weights   01/28/14 1713 01/28/14 2115  Weight: 60.1 kg (132 lb 7.9 oz) 58.695 kg (129 lb 6.4 oz)    History of present illness:  28 y.o. male with a hx of ESRD normally on TTS HD and sickle cell disease who is referred to the ED for an abnormally high potassium and low hemoglobin. Pt had admitted to not doing HD since 4/18 secondary to "life." On further questioning, pt admits to smoking marijuana on a daily basis. Nephrology consulted. For urgent HD. Hospitalist consulted for admission.   Hospital Course:  Principal Problem:  Hyperkalemia  Active Problems:  Sickle cell disease  ESRD on dialysis  Anemia   1. Hyperkalemia  1. Secondary to noncompliance with HD 2. Resolved post HD; cont HD as scheduled per nephrologist  2. Sickle cell disease  1. Anemic, but no symptomats 2. Would hold off transfusion unless retic count is low 3. Continue folic acid, repeat Hg is stable no s/s of hemolysis; no symptoms; cont monitor OP recheck Hg in 3-5 days with next HD   3. ESRD  S/p urgent HD; stable cont HD as scheduled   4. Polysubstance abuse  1. Advised to avoid marijuana   Procedures:  HD (i.e. Studies not automatically included, echos, thoracentesis, etc; not x-rays)  Consultations:  Nephrology   Discharge Exam: Filed Vitals:   01/29/14 0502  BP: 130/86  Pulse: 72  Temp: 97.7 F (36.5 C)  Resp: 17    General: alert Cardiovascular: s1,s2 rrr Respiratory: CTA BL  Discharge  Instructions  Discharge Orders   Future Orders Complete By Expires   Diet - low sodium heart healthy  As directed    Discharge instructions  As directed    Increase activity slowly  As directed        Medication List         cinacalcet 30 MG tablet  Commonly known as:  SENSIPAR  Take 30 mg by mouth daily.     darbepoetin 150 MCG/0.3ML Soln injection  Commonly known as:  ARANESP  Inject 150 mcg into the vein every Friday with hemodialysis. Once weekly. Pt thinjks maybe on Tuesday     folic acid 1 MG tablet  Commonly known as:  FOLVITE  Take 1 tablet (1 mg total) by mouth daily.     multivitamin Tabs tablet  Take 1 tablet by mouth at bedtime.     oxyCODONE-acetaminophen 5-325 MG per tablet  Commonly known as:  PERCOCET  Take 1 tablet by mouth every 4 (four) hours as needed.     sevelamer carbonate 800 MG tablet  Commonly known as:  RENVELA  Take 800 mg by mouth 3 (three) times daily with meals.       Allergies  Allergen Reactions  . Codeine Hives       Follow-up Information   Follow up with AVBUERE,EDWIN A, MD In 1 week.   Specialty:  Internal Medicine   Contact information:   [address] [place] [address] [phone]  The results of significant diagnostics from this hospitalization (including imaging, microbiology, ancillary and laboratory) are listed below for reference.    Significant Diagnostic Studies: Dg Chest Port 1 View  01/28/2014   CLINICAL DATA:  Dialysis, sickle cell disease  EXAM: PORTABLE CHEST - 1 VIEW  COMPARISON:  Portable exam 1633 hr compared to 08/22/2013  FINDINGS: Enlargement of cardiac silhouette.  Mediastinal contours and pulmonary vascularity normal.  Lungs clear.  No pleural effusion or pneumothorax.  No acute osseous findings.  IMPRESSION: Mild enlargement of cardiac silhouette.  No acute abnormalities.   Electronically Signed   By: Ulyses SouthwardMark  Boles M.D.   On: 01/28/2014 16:53    Microbiology: No results found for  this or any previous visit (from the past 240 hour(s)).   Labs: Basic Metabolic Panel:  Recent Labs Lab 01/28/14 1439 01/28/14 2040  NA 135*  --   K 7.6* 3.0*  CL 107  --   CO2 12*  --   GLUCOSE 107*  --   BUN 83*  --   CREATININE 9.28*  --   CALCIUM 9.1  --    Liver Function Tests:  Recent Labs Lab 01/28/14 1439  AST 41*  ALT 20  ALKPHOS 101  BILITOT 2.3*  PROT 8.1  ALBUMIN 3.7   No results found for this basename: LIPASE, AMYLASE,  in the last 168 hours No results found for this basename: AMMONIA,  in the last 168 hours CBC:  Recent Labs Lab 01/28/14 1439 01/29/14 0918  WBC 6.2  --   NEUTROABS 4.1  --   HGB 5.4* 5.4*  HCT 15.6* 15.1*  MCV 85.7  --   PLT 272  --    Cardiac Enzymes: No results found for this basename: CKTOTAL, CKMB, CKMBINDEX, TROPONINI,  in the last 168 hours BNP: BNP (last 3 results) No results found for this basename: PROBNP,  in the last 8760 hours CBG: No results found for this basename: GLUCAP,  in the last 168 hours     Signed:  Esperanza SheetsUlugbek N Biruk Troia  [facility] 01/29/2014, 11:23 AM

## 2014-01-30 NOTE — Telephone Encounter (Signed)
Encounter opened in error

## 2014-02-01 LAB — TYPE AND SCREEN
ABO/RH(D): O NEG
ANTIBODY SCREEN: NEGATIVE
UNIT DIVISION: 0
UNIT DIVISION: 0

## 2014-06-01 DEATH — deceased
